# Patient Record
Sex: Male | Born: 1950 | Race: Black or African American | Hispanic: No | Marital: Married | Smoking: Never smoker
Health system: Southern US, Community
[De-identification: ages and names within clinical notes are randomized; demographics above are authoritative.]

## PROBLEM LIST (undated history)

## (undated) DIAGNOSIS — T884XXA Failed or difficult intubation, initial encounter: Secondary | ICD-10-CM

## (undated) DIAGNOSIS — E119 Type 2 diabetes mellitus without complications: Secondary | ICD-10-CM

## (undated) DIAGNOSIS — H4089 Other specified glaucoma: Secondary | ICD-10-CM

## (undated) DIAGNOSIS — E785 Hyperlipidemia, unspecified: Secondary | ICD-10-CM

## (undated) DIAGNOSIS — H04123 Dry eye syndrome of bilateral lacrimal glands: Secondary | ICD-10-CM

## (undated) DIAGNOSIS — I10 Essential (primary) hypertension: Secondary | ICD-10-CM

## (undated) DIAGNOSIS — G709 Myoneural disorder, unspecified: Secondary | ICD-10-CM

## (undated) DIAGNOSIS — N400 Enlarged prostate without lower urinary tract symptoms: Secondary | ICD-10-CM

## (undated) HISTORY — DX: Myoneural disorder, unspecified: G70.9

## (undated) HISTORY — PX: POLYPECTOMY: SHX149

## (undated) HISTORY — DX: Essential (primary) hypertension: I10

## (undated) HISTORY — DX: Dry eye syndrome of bilateral lacrimal glands: H04.123

## (undated) HISTORY — DX: Type 2 diabetes mellitus without complications: E11.9

## (undated) HISTORY — DX: Hyperlipidemia, unspecified: E78.5

## (undated) HISTORY — DX: Benign prostatic hyperplasia without lower urinary tract symptoms: N40.0

## (undated) HISTORY — PX: COLONOSCOPY: SHX174

---
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
## 1995-10-31 HISTORY — PX: LEG SURGERY: SHX1003

## 2008-10-30 DIAGNOSIS — N400 Enlarged prostate without lower urinary tract symptoms: Secondary | ICD-10-CM

## 2008-10-30 HISTORY — DX: Benign prostatic hyperplasia without lower urinary tract symptoms: N40.0

## 2013-04-09 ENCOUNTER — Ambulatory Visit (INDEPENDENT_AMBULATORY_CARE_PROVIDER_SITE_OTHER): Payer: BC Managed Care – PPO | Admitting: Family Medicine

## 2013-04-09 VITALS — BP 128/80 | HR 86 | Temp 98.6°F | Resp 18 | Ht 66.5 in | Wt 149.2 lb

## 2013-04-09 DIAGNOSIS — E119 Type 2 diabetes mellitus without complications: Secondary | ICD-10-CM

## 2013-04-09 DIAGNOSIS — N400 Enlarged prostate without lower urinary tract symptoms: Secondary | ICD-10-CM

## 2013-04-09 DIAGNOSIS — I10 Essential (primary) hypertension: Secondary | ICD-10-CM | POA: Insufficient documentation

## 2013-04-09 DIAGNOSIS — E1140 Type 2 diabetes mellitus with diabetic neuropathy, unspecified: Secondary | ICD-10-CM | POA: Insufficient documentation

## 2013-04-09 DIAGNOSIS — I152 Hypertension secondary to endocrine disorders: Secondary | ICD-10-CM | POA: Insufficient documentation

## 2013-04-09 DIAGNOSIS — N138 Other obstructive and reflux uropathy: Secondary | ICD-10-CM

## 2013-04-09 LAB — COMPREHENSIVE METABOLIC PANEL
ALT: 18 U/L (ref 0–53)
AST: 18 U/L (ref 0–37)
Calcium: 9.4 mg/dL (ref 8.4–10.5)
Chloride: 107 mEq/L (ref 96–112)
Creat: 0.88 mg/dL (ref 0.50–1.35)
Total Bilirubin: 0.6 mg/dL (ref 0.3–1.2)

## 2013-04-09 LAB — POCT GLYCOSYLATED HEMOGLOBIN (HGB A1C): Hemoglobin A1C: 5.5

## 2013-04-09 MED ORDER — QUINAPRIL HCL 20 MG PO TABS: 20.0000 mg | ORAL_TABLET | Freq: Every day | ORAL | Status: DC

## 2013-04-09 MED ORDER — GLIPIZIDE 5 MG PO TABS: 5.0000 mg | ORAL_TABLET | Freq: Every day | ORAL | Status: DC

## 2013-04-09 MED ORDER — ATENOLOL 100 MG PO TABS: 100.0000 mg | ORAL_TABLET | Freq: Every day | ORAL | Status: DC

## 2013-04-09 MED ORDER — TAMSULOSIN HCL 0.4 MG PO CAPS: 0.4000 mg | ORAL_CAPSULE | Freq: Every day | ORAL | Status: DC

## 2013-04-09 MED ORDER — METFORMIN HCL 500 MG PO TABS: 500.0000 mg | ORAL_TABLET | Freq: Two times a day (BID) | ORAL | Status: DC

## 2013-04-09 MED ORDER — AMLODIPINE BESYLATE 10 MG PO TABS: 10.0000 mg | ORAL_TABLET | Freq: Every day | ORAL | Status: DC

## 2013-04-09 NOTE — Progress Notes (Signed)
[facility] [address] [place] [address] [phone]  Date:  04/09/2013   Name:  Preston Pennington   DOB:  Oct 02, 1951   MRN:  [id number]  PCP:  No primary provider on file.    Chief Complaint: Medication Refill   History of Present Illness:  Preston Pennington is a 62 y.o. very pleasant male patient who presents with the following:  He is here today to get a medication refill.  He is seeking a new PCP.  He does have a urologist in [facility] as well He has no other complaints today, has not had labs yet this year.   Compliant with his medications  There are no active problems to display for this patient.   Past Medical History  Diagnosis Date  . Hypertension   . Diabetes mellitus without complication   . Prostate enlargement 2010    See's Urologist     Past Surgical History  Procedure Laterality Date  . Leg surgery Right 1997    History  Substance Use Topics  . Smoking status: Never Smoker   . Smokeless tobacco: Not on file  . Alcohol Use: No    History reviewed. No pertinent family history.  No Known Allergies  Medication list has been reviewed and updated.  No current outpatient prescriptions on file prior to visit.   No current facility-administered medications on file prior to visit.    Review of Systems:  As per HPI- otherwise negative.   Physical Examination: Filed Vitals:   04/09/13 1210  BP: 128/80  Pulse: 86  Temp: 98.6 F (37 C)  Resp: 18   Filed Vitals:   04/09/13 1210  Height: 5' 6.5" (1.689 m)  Weight: 149 lb 3.2 oz (67.677 kg)   Body mass index is 23.72 kg/(m^2). Ideal Body Weight: Weight in (lb) to have BMI = 25: 156.9  GEN: WDWN, NAD, Non-toxic, A & O x 3, looks well HEENT: Atraumatic, Normocephalic. Neck supple. No masses, No LAD. Ears and Nose: No external deformity. CV: RRR, No M/G/R. No JVD. No thrill. No extra heart sounds. PULM: CTA B, no wheezes, crackles, rhonchi. No retractions. No resp.  distress. No accessory muscle use. ABD: S, NT, ND. No rebound. No HSM. EXTR: No c/c/e NEURO Normal gait.  PSYCH: Normally interactive. Conversant. Not depressed or anxious appearing.  Calm demeanor.    Results for orders placed in visit on 04/09/13  POCT GLYCOSYLATED HEMOGLOBIN (HGB A1C)      Result Value Range   Hemoglobin A1C 5.5      Assessment and Plan: Type II or unspecified type diabetes mellitus without mention of complication, not stated as uncontrolled - Plan: POCT glycosylated hemoglobin (Hb A1C), glipiZIDE (GLUCOTROL) 5 MG tablet, metFORMIN (GLUCOPHAGE) 500 MG tablet, glipiZIDE (GLUCOTROL) 5 MG tablet  BPH (benign prostatic hyperplasia) - Plan: tamsulosin (FLOMAX) 0.4 MG CAPS  HTN (hypertension) - Plan: amLODipine (NORVASC) 10 MG tablet, atenolol (TENORMIN) 100 MG tablet, quinapril (ACCUPRIL) 20 MG tablet, Comprehensive metabolic panel, atenolol (TENORMIN) 100 MG tablet, quinapril (ACCUPRIL) 20 MG tablet  Unspecified essential hypertension  DM controlled, BP controlled.  Sent all his rx to his mail order pharmacy, and also did 30 day supply to a local pharmacy for those he is nearly out of.   Will plan further follow- up pending labs. Plan recheck in about 3 months.    Signed Abbe Amsterdam, MD

## 2013-04-09 NOTE — Patient Instructions (Addendum)
Your diabetes control is good, and your blood pressure looks fine.  I will be in touch with the rest of your labs.  Your medicines have been sent to your [facility] pharmacy.  Take care!

## 2013-04-10 ENCOUNTER — Encounter: Payer: Self-pay | Admitting: Family Medicine

## 2013-09-23 ENCOUNTER — Other Ambulatory Visit: Payer: Self-pay | Admitting: Internal Medicine

## 2013-09-23 ENCOUNTER — Ambulatory Visit
Admission: RE | Admit: 2013-09-23 | Discharge: 2013-09-23 | Disposition: A | Discharge status: Home / Self Care | Payer: Worker's Compensation | Source: Ambulatory Visit | Attending: Internal Medicine | Admitting: Internal Medicine

## 2013-09-23 DIAGNOSIS — S1093XA Contusion of unspecified part of neck, initial encounter: Secondary | ICD-10-CM

## 2013-09-23 DIAGNOSIS — S0003XA Contusion of scalp, initial encounter: Secondary | ICD-10-CM

## 2013-12-03 ENCOUNTER — Ambulatory Visit (INDEPENDENT_AMBULATORY_CARE_PROVIDER_SITE_OTHER): Payer: 59 | Admitting: Family

## 2013-12-03 ENCOUNTER — Encounter: Payer: Self-pay | Admitting: Family

## 2013-12-03 VITALS — BP 110/64 | HR 63 | Ht 66.5 in | Wt 159.0 lb

## 2013-12-03 DIAGNOSIS — E119 Type 2 diabetes mellitus without complications: Secondary | ICD-10-CM

## 2013-12-03 DIAGNOSIS — Z7189 Other specified counseling: Secondary | ICD-10-CM

## 2013-12-03 DIAGNOSIS — Z7689 Persons encountering health services in other specified circumstances: Secondary | ICD-10-CM

## 2013-12-03 DIAGNOSIS — I10 Essential (primary) hypertension: Secondary | ICD-10-CM

## 2013-12-03 DIAGNOSIS — N400 Enlarged prostate without lower urinary tract symptoms: Secondary | ICD-10-CM

## 2013-12-03 LAB — COMPREHENSIVE METABOLIC PANEL
ALT: 17 U/L (ref 0–53)
AST: 19 U/L (ref 0–37)
Albumin: 3.9 g/dL (ref 3.5–5.2)
Alkaline Phosphatase: 63 U/L (ref 39–117)
BILIRUBIN TOTAL: 0.9 mg/dL (ref 0.3–1.2)
BUN: 14 mg/dL (ref 6–23)
CHLORIDE: 105 meq/L (ref 96–112)
CO2: 27 mEq/L (ref 19–32)
Calcium: 9.2 mg/dL (ref 8.4–10.5)
Creatinine, Ser: 0.8 mg/dL (ref 0.4–1.5)
GFR: 118.56 mL/min (ref >60.00)
Glucose, Bld: 89 mg/dL (ref 70–99)
Potassium: 4 mEq/L (ref 3.5–5.1)
Sodium: 140 mEq/L (ref 135–145)
Total Protein: 7 g/dL (ref 6.0–8.3)

## 2013-12-03 LAB — CBC WITH DIFFERENTIAL/PLATELET
BASOS PCT: 0.4 % (ref 0.0–3.0)
Basophils Absolute: 0 10*3/uL (ref 0.0–0.1)
EOS ABS: 0.2 10*3/uL (ref 0.0–0.7)
Eosinophils Relative: 4.7 % (ref 0.0–5.0)
HEMATOCRIT: 44.8 % (ref 39.0–52.0)
HEMOGLOBIN: 14.1 g/dL (ref 13.0–17.0)
LYMPHS ABS: 1.8 10*3/uL (ref 0.7–4.0)
LYMPHS PCT: 41 % (ref 12.0–46.0)
MCHC: 31.5 g/dL (ref 30.0–36.0)
MCV: 82.6 fl (ref 78.0–100.0)
Monocytes Absolute: 0.4 10*3/uL (ref 0.1–1.0)
Monocytes Relative: 8.2 % (ref 3.0–12.0)
NEUTROS ABS: 2 10*3/uL (ref 1.4–7.7)
Neutrophils Relative %: 45.7 % (ref 43.0–77.0)
Platelets: 133 10*3/uL — ABNORMAL LOW (ref 150.0–400.0)
RBC: 5.43 Mil/uL (ref 4.22–5.81)
RDW: 13.1 % (ref 11.5–14.6)
WBC: 4.3 10*3/uL — ABNORMAL LOW (ref 4.5–10.5)

## 2013-12-03 LAB — POCT URINALYSIS DIPSTICK
BILIRUBIN UA: NEGATIVE
Blood, UA: NEGATIVE
Glucose, UA: NEGATIVE
Ketones, UA: NEGATIVE
LEUKOCYTES UA: NEGATIVE
NITRITE UA: NEGATIVE
PROTEIN UA: NEGATIVE
Spec Grav, UA: 1.015
Urobilinogen, UA: 0.2
pH, UA: 6

## 2013-12-03 LAB — LIPID PANEL
CHOL/HDL RATIO: 4
Cholesterol: 164 mg/dL (ref 0–200)
HDL: 45.2 mg/dL (ref >39.00)
LDL Cholesterol: 109 mg/dL — ABNORMAL HIGH (ref 0–99)
Triglycerides: 47 mg/dL (ref 0.0–149.0)
VLDL: 9.4 mg/dL (ref 0.0–40.0)

## 2013-12-03 LAB — MICROALBUMIN / CREATININE URINE RATIO
Creatinine,U: 75.9 mg/dL
Microalb Creat Ratio: 2.1 mg/g (ref 0.0–30.0)
Microalb, Ur: 1.6 mg/dL (ref 0.0–1.9)

## 2013-12-03 LAB — HEMOGLOBIN A1C: Hgb A1c MFr Bld: 5.2 % (ref 4.6–6.5)

## 2013-12-03 MED ORDER — QUINAPRIL HCL 20 MG PO TABS: 20.0000 mg | ORAL_TABLET | Freq: Every day | ORAL | Status: DC

## 2013-12-03 MED ORDER — METFORMIN HCL 500 MG PO TABS: 500.0000 mg | ORAL_TABLET | Freq: Two times a day (BID) | ORAL | Status: DC

## 2013-12-03 MED ORDER — TAMSULOSIN HCL 0.4 MG PO CAPS: 0.4000 mg | ORAL_CAPSULE | Freq: Every day | ORAL | Status: DC

## 2013-12-03 MED ORDER — ATENOLOL 100 MG PO TABS: 100.0000 mg | ORAL_TABLET | Freq: Every day | ORAL | Status: DC

## 2013-12-03 MED ORDER — GLIPIZIDE 5 MG PO TABS: 5.0000 mg | ORAL_TABLET | Freq: Every day | ORAL | Status: DC

## 2013-12-03 NOTE — Progress Notes (Signed)
   Subjective:    Patient ID: Preston Pennington, male    DOB: 11-Jun-1951, 63 y.o.   MRN: [id number]  HPI  63 y.o. Black male presents today to establish care. Pt has a PMH of type 2 diabetes, HTN, BPH. Pt has no current complaints. Pt is requesting a chest xray for work as his method of TB testing.     Review of Systems  Constitutional: Negative.   HENT: Negative.   Eyes: Negative.   Respiratory: Negative.   Cardiovascular: Negative.   Gastrointestinal: Negative.   Endocrine: Negative.   Genitourinary: Negative.   Musculoskeletal: Negative.   Skin: Negative.   Allergic/Immunologic: Negative.   Neurological: Negative.   Hematological: Negative.   Psychiatric/Behavioral: Negative.    Past Medical History  Diagnosis Date  . Hypertension   . Diabetes mellitus without complication   . Prostate enlargement 2010    See's Urologist     History   Social History  . Marital Status: Married    Spouse Name: N/A    Number of Children: N/A  . Years of Education: N/A   Occupational History  . Not on file.   Social History Main Topics  . Smoking status: Never Smoker   . Smokeless tobacco: Not on file  . Alcohol Use: No  . Drug Use: No  . Sexual Activity: Not on file   Other Topics Concern  . Not on file   Social History Narrative  . No narrative on file    Past Surgical History  Procedure Laterality Date  . Leg surgery Right 1997    History reviewed. No pertinent family history.  No Known Allergies  Current Outpatient Prescriptions on File Prior to Visit  Medication Sig Dispense Refill  . amLODipine (NORVASC) 10 MG tablet Take 1 tablet (10 mg total) by mouth daily.  90 tablet  3   No current facility-administered medications on file prior to visit.    BP 110/64  Pulse 63  Ht 5' 6.5" (1.689 m)  Wt 159 lb (72.122 kg)  BMI 25.28 kg/m2chart    Objective:   Physical Exam  Constitutional: He is oriented to person, place, and time. Vital signs are normal. He appears  well-developed and well-nourished.  HENT:  Head: Normocephalic.  Nose: Nose normal.  Cardiovascular: Normal rate, regular rhythm, S1 normal, S2 normal, normal heart sounds and normal pulses.   Pulmonary/Chest: Effort normal and breath sounds normal.  Abdominal: Soft. Normal appearance and bowel sounds are normal.  Neurological: He is alert and oriented to person, place, and time.  Skin: Skin is warm, dry and intact.  Psychiatric: He has a normal mood and affect. His speech is normal and behavior is normal.          Assessment & Plan:  63 y.o. Black male presents today to establish care.  Type 2 diabetes: Continue Glipizide 5mg  p.o.   - Continue Metformin 500mg  BID p.o.  Hypertension: Continue Amlodipine 10mg  p.o.   - Continue Atenolol 100mg  p.o.   - Continue Quinapril 20mg  p.o.  BPH- Continue Tamsulosin 0.4mg  p.o.   Labs: CBC, CMP, Lipid panel, UA, Micro-albumin, hemoglobin A1C - Chest xray as requested by patients employer to r/o TB. Pt has no symptoms, this is an annual screening.

## 2013-12-03 NOTE — Patient Instructions (Signed)
Sodium-Controlled Diet Sodium is a mineral. It is found in many foods. Sodium may be found naturally or added during the making of a food. The most common form of sodium is salt, which is made up of sodium and chloride. Reducing your sodium intake involves changing your eating habits. The following guidelines will help you reduce the sodium in your diet:  Stop using the salt shaker.  Use salt sparingly in cooking and baking.  Substitute with sodium-free seasonings and spices.  Do not use a salt substitute (potassium chloride) without your caregiver's permission.  Include a variety of fresh, unprocessed foods in your diet.  Limit the use of processed and convenience foods that are high in sodium. USE THE FOLLOWING FOODS SPARINGLY: Breads/Starches  Commercial bread stuffing, commercial pancake or waffle mixes, coating mixes. Waffles. Croutons. Prepared (boxed or frozen) potato, rice, or noodle mixes that contain salt or sodium. Salted [place] fries or hash browns. Salted popcorn, breads, crackers, chips, or snack foods. Vegetables  Vegetables canned with salt or prepared in cream, butter, or cheese sauces. Sauerkraut. Tomato or vegetable juices canned with salt.  Fresh vegetables are allowed if rinsed thoroughly. Fruit  Fruit is okay to eat. Meat and Meat Substitutes  Salted or smoked meats, such as bacon or Canadian bacon, chipped or corned beef, hot dogs, salt pork, luncheon meats, pastrami, ham, or sausage. Canned or smoked fish, poultry, or meat. Processed cheese or cheese spreads, blue or Roquefort cheese. Battered or frozen fish products. Prepared spaghetti sauce. Baked beans. Reuben sandwiches. Salted nuts. Caviar. Milk  Limit buttermilk to 1 cup per week. Soups and Combination Foods  Bouillon cubes, canned or dried soups, broth, consomm. Convenience (frozen or packaged) dinners with more than 600 mg sodium. Pot pies, pizza, Asian food, fast food cheeseburgers, and specialty  sandwiches. Desserts and Sweets  Regular (salted) desserts, pie, commercial fruit snack pies, commercial snack cakes, canned puddings.  Eat desserts and sweets in moderation. Fats and Oils  Gravy mixes or canned gravy. No more than 1 to 2 tbs of salad dressing. Chip dips.  Eat fats and oils in moderation. Beverages  See those listed under the vegetables and milk groups. Condiments  Ketchup, mustard, meat sauces, salsa, regular (salted) and lite soy sauce or mustard. Dill pickles, olives, meat tenderizer. Prepared horseradish or pickle relish. Dutch-processed cocoa. Baking powder or baking soda used medicinally. Worcestershire sauce. "Light" salt. Salt substitute, unless approved by your caregiver. Document Released: 04/07/2002 Document Revised: 01/08/2012 Document Reviewed: 11/08/2009 [facility] Patient Information 2014 [place], [place]. Exercise to Stay Healthy Exercise helps you become and stay healthy. EXERCISE IDEAS AND TIPS Choose exercises that:  You enjoy.  Fit into your day. You do not need to exercise really hard to be healthy. You can do exercises at a slow or medium level and stay healthy. You can:  Stretch before and after working out.  Try yoga, Pilates, or tai chi.  Lift weights.  Walk fast, swim, jog, run, climb stairs, bicycle, dance, or rollerskate.  Take aerobic classes. Exercises that burn about 150 calories:  Running 1  miles in 15 minutes.  Playing volleyball for 45 to 60 minutes.  Washing and waxing a car for 45 to 60 minutes.  Playing touch football for 45 minutes.  Walking 1  miles in 35 minutes.  Pushing a stroller 1  miles in 30 minutes.  Playing basketball for 30 minutes.  Raking leaves for 30 minutes.  Bicycling 5 miles in 30 minutes.  Walking 2 miles in  30 minutes.  Dancing for 30 minutes.  Shoveling snow for 15 minutes.  Swimming laps for 20 minutes.  Walking up stairs for 15 minutes.  Bicycling 4 miles in 15  minutes.  Gardening for 30 to 45 minutes.  Jumping rope for 15 minutes.  Washing windows or floors for 45 to 60 minutes. Document Released: 11/18/2010 Document Revised: 01/08/2012 Document Reviewed: 11/18/2010 [facility] Patient Information 2014 [place], [place].

## 2013-12-05 ENCOUNTER — Telehealth: Payer: Self-pay

## 2013-12-05 ENCOUNTER — Ambulatory Visit (INDEPENDENT_AMBULATORY_CARE_PROVIDER_SITE_OTHER)
Admission: RE | Admit: 2013-12-05 | Discharge: 2013-12-05 | Disposition: A | Discharge status: Home / Self Care | Payer: 59 | Source: Ambulatory Visit | Attending: Family | Admitting: Family

## 2013-12-05 DIAGNOSIS — Z7189 Other specified counseling: Secondary | ICD-10-CM

## 2013-12-05 DIAGNOSIS — Z7689 Persons encountering health services in other specified circumstances: Secondary | ICD-10-CM

## 2013-12-05 NOTE — Telephone Encounter (Signed)
Relevant patient education assigned to patient using Emmi. ° °

## 2013-12-08 ENCOUNTER — Telehealth: Payer: Self-pay | Admitting: Family

## 2013-12-08 NOTE — Telephone Encounter (Signed)
Pt is requesting results from labs done on 12/03/13

## 2013-12-08 NOTE — Telephone Encounter (Signed)
Pt aware chest x-ray was normal and copy mailed

## 2013-12-22 ENCOUNTER — Telehealth: Payer: Self-pay | Admitting: Family

## 2013-12-22 DIAGNOSIS — E119 Type 2 diabetes mellitus without complications: Secondary | ICD-10-CM

## 2013-12-22 DIAGNOSIS — N400 Enlarged prostate without lower urinary tract symptoms: Secondary | ICD-10-CM

## 2013-12-22 DIAGNOSIS — I10 Essential (primary) hypertension: Secondary | ICD-10-CM

## 2013-12-22 MED ORDER — ATENOLOL 100 MG PO TABS: 100.0000 mg | ORAL_TABLET | Freq: Every day | ORAL | Status: DC

## 2013-12-22 MED ORDER — GLIPIZIDE 5 MG PO TABS: 5.0000 mg | ORAL_TABLET | Freq: Every day | ORAL | Status: DC

## 2013-12-22 MED ORDER — TAMSULOSIN HCL 0.4 MG PO CAPS: 0.4000 mg | ORAL_CAPSULE | Freq: Every day | ORAL | Status: DC

## 2013-12-22 MED ORDER — QUINAPRIL HCL 20 MG PO TABS
20.0000 mg | ORAL_TABLET | Freq: Every day | ORAL | Status: DC
Start: 2013-12-22 — End: 2014-01-30

## 2013-12-22 MED ORDER — AMLODIPINE BESYLATE 10 MG PO TABS: 10.0000 mg | ORAL_TABLET | Freq: Every day | ORAL | Status: DC

## 2013-12-22 MED ORDER — METFORMIN HCL 500 MG PO TABS: 500.0000 mg | ORAL_TABLET | Freq: Two times a day (BID) | ORAL | Status: DC

## 2013-12-22 NOTE — Telephone Encounter (Signed)
Rxs were sent to [facility] on 12/03/13 for 90 day supplies. 30 day supply sent to pharmacy

## 2013-12-22 NOTE — Telephone Encounter (Signed)
Pt needs refill on amlodipine,flomax,quinapril,metformin,tenorim,glipizide #30 each w/refills sent to [facility] [address]

## 2014-01-30 ENCOUNTER — Encounter (HOSPITAL_COMMUNITY): Payer: Self-pay | Admitting: Emergency Medicine

## 2014-01-30 ENCOUNTER — Emergency Department (HOSPITAL_COMMUNITY)
Admission: EM | Admit: 2014-01-30 | Discharge: 2014-01-30 | Disposition: A | Discharge status: Home / Self Care | Payer: 59 | Attending: Emergency Medicine | Admitting: Emergency Medicine

## 2014-01-30 ENCOUNTER — Emergency Department (HOSPITAL_COMMUNITY): Payer: 59

## 2014-01-30 DIAGNOSIS — Z7982 Long term (current) use of aspirin: Secondary | ICD-10-CM | POA: Insufficient documentation

## 2014-01-30 DIAGNOSIS — E119 Type 2 diabetes mellitus without complications: Secondary | ICD-10-CM | POA: Insufficient documentation

## 2014-01-30 DIAGNOSIS — R209 Unspecified disturbances of skin sensation: Secondary | ICD-10-CM | POA: Insufficient documentation

## 2014-01-30 DIAGNOSIS — S93409A Sprain of unspecified ligament of unspecified ankle, initial encounter: Secondary | ICD-10-CM | POA: Insufficient documentation

## 2014-01-30 DIAGNOSIS — I10 Essential (primary) hypertension: Secondary | ICD-10-CM | POA: Insufficient documentation

## 2014-01-30 DIAGNOSIS — Z79899 Other long term (current) drug therapy: Secondary | ICD-10-CM | POA: Insufficient documentation

## 2014-01-30 DIAGNOSIS — R202 Paresthesia of skin: Secondary | ICD-10-CM

## 2014-01-30 DIAGNOSIS — X500XXA Overexertion from strenuous movement or load, initial encounter: Secondary | ICD-10-CM | POA: Insufficient documentation

## 2014-01-30 DIAGNOSIS — Z9889 Other specified postprocedural states: Secondary | ICD-10-CM | POA: Insufficient documentation

## 2014-01-30 DIAGNOSIS — Y9301 Activity, walking, marching and hiking: Secondary | ICD-10-CM | POA: Insufficient documentation

## 2014-01-30 DIAGNOSIS — N400 Enlarged prostate without lower urinary tract symptoms: Secondary | ICD-10-CM | POA: Insufficient documentation

## 2014-01-30 DIAGNOSIS — Y929 Unspecified place or not applicable: Secondary | ICD-10-CM | POA: Insufficient documentation

## 2014-01-30 DIAGNOSIS — S93401A Sprain of unspecified ligament of right ankle, initial encounter: Secondary | ICD-10-CM

## 2014-01-30 LAB — CBC WITH DIFFERENTIAL/PLATELET
BASOS ABS: 0 10*3/uL (ref 0.0–0.1)
Basophils Relative: 0 % (ref 0–1)
Eosinophils Absolute: 0.2 10*3/uL (ref 0.0–0.7)
Eosinophils Relative: 4 % (ref 0–5)
HEMATOCRIT: 41.6 % (ref 39.0–52.0)
Hemoglobin: 14 g/dL (ref 13.0–17.0)
LYMPHS PCT: 30 % (ref 12–46)
Lymphs Abs: 1.2 10*3/uL (ref 0.7–4.0)
MCH: 26.7 pg (ref 26.0–34.0)
MCHC: 33.7 g/dL (ref 30.0–36.0)
MCV: 79.4 fL (ref 78.0–100.0)
MONO ABS: 0.2 10*3/uL (ref 0.1–1.0)
MONOS PCT: 6 % (ref 3–12)
NEUTROS ABS: 2.5 10*3/uL (ref 1.7–7.7)
Neutrophils Relative %: 60 % (ref 43–77)
Platelets: 122 10*3/uL — ABNORMAL LOW (ref 150–400)
RBC: 5.24 MIL/uL (ref 4.22–5.81)
RDW: 12.9 % (ref 11.5–15.5)
WBC: 4.1 10*3/uL (ref 4.0–10.5)

## 2014-01-30 LAB — COMPREHENSIVE METABOLIC PANEL
ALT: 13 U/L (ref 0–53)
AST: 15 U/L (ref 0–37)
Albumin: 3.7 g/dL (ref 3.5–5.2)
Alkaline Phosphatase: 71 U/L (ref 39–117)
BILIRUBIN TOTAL: 0.5 mg/dL (ref 0.3–1.2)
BUN: 15 mg/dL (ref 6–23)
CHLORIDE: 104 meq/L (ref 96–112)
CO2: 25 meq/L (ref 19–32)
Calcium: 9.3 mg/dL (ref 8.4–10.5)
Creatinine, Ser: 0.85 mg/dL (ref 0.50–1.35)
GFR calc Af Amer: >90 mL/min (ref >90)
Glucose, Bld: 218 mg/dL — ABNORMAL HIGH (ref 70–99)
Potassium: 4.8 mEq/L (ref 3.7–5.3)
Sodium: 141 mEq/L (ref 137–147)
Total Protein: 6.9 g/dL (ref 6.0–8.3)

## 2014-01-30 LAB — CBG MONITORING, ED: Glucose-Capillary: 226 mg/dL — ABNORMAL HIGH (ref 70–99)

## 2014-01-30 MED ORDER — TRAMADOL HCL 50 MG PO TABS: 50.0000 mg | ORAL_TABLET | Freq: Four times a day (QID) | ORAL | Status: DC | PRN

## 2014-01-30 NOTE — ED Notes (Signed)
cbg 226

## 2014-01-30 NOTE — ED Notes (Addendum)
Pt presents with ankle pain to his left ankle X 2 weeks. Pt states that he was walking up stairs when his "anke slippled"  Pt denies any falls or additional injuries  Pt has had surgery on that extremity. Pt also "numbnes" to left fingers" X since January 2015. Pt states that he has seen [place] for the numbness  "but she did not see anything" and numbness to his leg leg X 4 days. Pt denies any recent illness, pain with urination. Pt is ambulatory at time of triage
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
## 2014-01-30 NOTE — ED Notes (Signed)
Discharge instructions reviewed with pt and daughter. Pt verbalized understanding.

## 2014-01-30 NOTE — ED Notes (Signed)
Patient transported to X-ray 

## 2014-01-30 NOTE — Discharge Instructions (Signed)
Continue naprosyn for pain and inflammation. Ultram for severe pain. Keep ankle elevated when at home. Ice several times a day. Follow up with orthopedics specialist regarding your ankle. Follow up with primary care doctor regarding your numbness. Your MR of the brain is negative for a stroke.   Paresthesia Paresthesia is an abnormal burning or prickling sensation. This sensation is generally felt in the hands, arms, legs, or feet. However, it may occur in any part of the body. It is usually not painful. The feeling may be described as:  Tingling or numbness.  "Pins and needles."  Skin crawling.  Buzzing.  Limbs "falling asleep."  Itching. Most people experience temporary (transient) paresthesia at some time in their lives. CAUSES  Paresthesia may occur when you breathe too quickly (hyperventilation). It can also occur without any apparent cause. Commonly, paresthesia occurs when pressure is placed on a nerve. The feeling quickly goes away once the pressure is removed. For some people, however, paresthesia is a long-lasting (chronic) condition caused by an underlying disorder. The underlying disorder may be:  A traumatic, direct injury to nerves. Examples include a:  Broken (fractured) neck.  Fractured skull.  A disorder affecting the brain and spinal cord (central nervous system). Examples include:  Transverse myelitis.  Encephalitis.  Transient ischemic attack.  Multiple sclerosis.  Stroke.  Tumor or blood vessel problems, such as an arteriovenous malformation pressing against the brain or spinal cord.  A condition that damages the peripheral nerves (peripheral neuropathy). Peripheral nerves are not part of the brain and spinal cord. These conditions include:  Diabetes.  Peripheral vascular disease.  Nerve entrapment syndromes, such as carpal tunnel syndrome.  Shingles.  Hypothyroidism.  Vitamin B12 deficiencies.  Alcoholism.  Heavy metal poisoning (lead,  arsenic).  Rheumatoid arthritis.  Systemic lupus erythematosus. DIAGNOSIS  Your caregiver will attempt to find the underlying cause of your paresthesia. Your caregiver may:  Take your medical history.  Perform a physical exam.  Order various lab tests.  Order imaging tests. TREATMENT  Treatment for paresthesia depends on the underlying cause. HOME CARE INSTRUCTIONS  Avoid drinking alcohol.  You may consider massage or acupuncture to help relieve your symptoms.  Keep all follow-up appointments as directed by your caregiver. SEEK IMMEDIATE MEDICAL CARE IF:   You feel weak.  You have trouble walking or moving.  You have problems with speech or vision.  You feel confused.  You cannot control your bladder or bowel movements.  You feel numbness after an injury.  You faint.  Your burning or prickling feeling gets worse when walking.  You have pain, cramps, or dizziness.  You develop a rash. MAKE SURE YOU:  Understand these instructions.  Will watch your condition.  Will get help right away if you are not doing well or get worse. Document Released: 10/06/2002 Document Revised: 01/08/2012 Document Reviewed: 07/07/2011 [facility] Patient Information 2014 [place].

## 2014-01-30 NOTE — ED Notes (Signed)
Pt ambulated to restroom. 

## 2014-01-31 NOTE — ED Provider Notes (Signed)
Medical screening examination/treatment/procedure(s) were performed by non-physician practitioner and as supervising physician I was immediately available for consultation/collaboration.   EKG Interpretation None        Blanchard Kelch, MD 01/31/14 1244

## 2014-02-02 ENCOUNTER — Telehealth: Payer: Self-pay | Admitting: Family

## 2014-02-02 NOTE — Telephone Encounter (Signed)
Patient went to [facility] on Friday, 01/30/14 for R ankle sprain and parasthesia of L arm and leg and was advised to schedule an appointment with his PCP Roxy Cedar) as soon as possible. [facility] doesn't have any 30 slots available for hospital followups and the patient can only come in the morning. I need your permission to schedule him to see you and use to same day appointment to allot for the time that the appointment needs. I told the patient that I had to send a note back and that we will call him to schedule the appointment after you approve it. His contact number has been verified. Thanks!!!

## 2014-02-02 NOTE — Telephone Encounter (Signed)
Pt moved to [facility] schedule.

## 2014-02-02 NOTE — Telephone Encounter (Signed)
Abby Potash has agreed to see him tomorrow taking two 15 minute slots - he should see PCP.

## 2014-02-03 ENCOUNTER — Ambulatory Visit (INDEPENDENT_AMBULATORY_CARE_PROVIDER_SITE_OTHER): Payer: 59 | Admitting: Family

## 2014-02-03 ENCOUNTER — Encounter: Payer: Self-pay | Admitting: Family

## 2014-02-03 VITALS — BP 124/72 | Temp 98.7°F | Wt 155.0 lb

## 2014-02-03 DIAGNOSIS — S93401A Sprain of unspecified ligament of right ankle, initial encounter: Secondary | ICD-10-CM

## 2014-02-03 DIAGNOSIS — E119 Type 2 diabetes mellitus without complications: Secondary | ICD-10-CM

## 2014-02-03 DIAGNOSIS — S93409A Sprain of unspecified ligament of unspecified ankle, initial encounter: Secondary | ICD-10-CM

## 2014-02-03 MED ORDER — GLUCOSE BLOOD VI STRP: ORAL_STRIP | Status: DC

## 2014-02-03 MED ORDER — MELOXICAM 7.5 MG PO TABS: 7.5000 mg | ORAL_TABLET | Freq: Every day | ORAL | Status: DC

## 2014-02-03 NOTE — Progress Notes (Signed)
Pre visit review using our clinic review tool, if applicable. No additional management support is needed unless otherwise documented below in the visit note. 

## 2014-02-03 NOTE — Progress Notes (Signed)
Subjective:    Patient ID: Preston Pennington, male    DOB: 03-Apr-1951, 63 y.o.   MRN: [id number]  HPI  63 year old African male, nonsmoker is in today as a hospital followup from 01/30/2014. He has a history of hypertension and type 2 diabetes that is currently stable. He has a history of a fracture right ankle and rolled his ankle over 2 weeks prior to his visit to the emergency department. He continued to have right ankle pain that has improved. Rates the pain a 4/10, worse with standing for an extended period of time. Had an x-ray performed that showed a chronic distal fibula fracture. He was prescribed tramadol to help the pain. Has also been applying ice.  Review of Systems  Constitutional: Negative.   HENT: Negative.   Respiratory: Negative.   Cardiovascular: Negative.   Gastrointestinal: Negative.   Genitourinary: Negative.   Musculoskeletal: Positive for arthralgias.       Right ankle pain  Skin: Negative.   Neurological: Negative.   Hematological: Negative.   Psychiatric/Behavioral: Negative.    Past Medical History  Diagnosis Date  . Hypertension   . Diabetes mellitus without complication   . Prostate enlargement 2010    See's Urologist     History   Social History  . Marital Status: Married    Spouse Name: N/A    Number of Children: N/A  . Years of Education: N/A   Occupational History  . Not on file.   Social History Main Topics  . Smoking status: Never Smoker   . Smokeless tobacco: Not on file  . Alcohol Use: No  . Drug Use: No  . Sexual Activity: Not on file   Other Topics Concern  . Not on file   Social History Narrative  . No narrative on file    Past Surgical History  Procedure Laterality Date  . Leg surgery Right 1997    History reviewed. No pertinent family history.  No Known Allergies  Current Outpatient Prescriptions on File Prior to Visit  Medication Sig Dispense Refill  . amLODipine (NORVASC) 10 MG tablet Take 10 mg by mouth daily.       Marland Kitchen aspirin EC 81 MG tablet Take 81 mg by mouth daily.      Marland Kitchen atenolol (TENORMIN) 100 MG tablet Take 100 mg by mouth daily.      Marland Kitchen glipiZIDE (GLUCOTROL) 5 MG tablet Take 5 mg by mouth daily before breakfast.      . metFORMIN (GLUCOPHAGE) 500 MG tablet Take 500 mg by mouth 2 (two) times daily with a meal.      . Multiple Vitamins-Minerals (MULTIVITAMIN PO) Take 1 tablet by mouth daily.      . quinapril (ACCUPRIL) 20 MG tablet Take 20 mg by mouth at bedtime.      . tamsulosin (FLOMAX) 0.4 MG CAPS capsule Take 0.4 mg by mouth daily after supper.      . traMADol (ULTRAM) 50 MG tablet Take 1 tablet (50 mg total) by mouth every 6 (six) hours as needed.  20 tablet  0   No current facility-administered medications on file prior to visit.    BP 124/72  Temp(Src) 98.7 F (37.1 C)  Wt 155 lb (70.308 kg)chart    Objective:   Physical Exam  Constitutional: He is oriented to person, place, and time. He appears well-developed and well-nourished.  HENT:  Right Ear: External ear normal.  Left Ear: External ear normal.  Nose: Nose normal.  Mouth/Throat: Oropharynx is  clear and moist.  Neck: Normal range of motion. Neck supple.  Cardiovascular: Normal rate, regular rhythm and normal heart sounds.   Pulmonary/Chest: Effort normal and breath sounds normal.  Abdominal: Soft. Bowel sounds are normal.  Musculoskeletal: He exhibits tenderness. He exhibits no edema.  Right lateral ankle tenderness to palpation. Full range of motion without pain.  Neurological: He is alert and oriented to person, place, and time.  Skin: Skin is warm and dry.  Psychiatric: He has a normal mood and affect.          Assessment & Plan:  Preston Pennington was seen today for no specified reason.  Diagnoses and associated orders for this visit:  Right ankle sprain  Type II or unspecified type diabetes mellitus without mention of complication, not stated as uncontrolled  Other Orders - meloxicam (MOBIC) 7.5 MG tablet; Take 1  tablet (7.5 mg total) by mouth daily. - glucose blood (FREESTYLE LITE) test strip; Use as instructed   Call the office with questions or concerns. Recheck as scheduled and as needed.

## 2014-02-03 NOTE — Patient Instructions (Signed)

## 2014-02-16 ENCOUNTER — Telehealth: Payer: Self-pay

## 2014-02-16 NOTE — Telephone Encounter (Signed)
Relevant patient education assigned to patient using Emmi. ° °

## 2014-03-03 ENCOUNTER — Encounter: Payer: 59 | Admitting: Family

## 2014-04-11 ENCOUNTER — Other Ambulatory Visit: Payer: Self-pay | Admitting: Family

## 2014-04-13 ENCOUNTER — Telehealth: Payer: Self-pay | Admitting: Family

## 2014-04-13 MED ORDER — METFORMIN HCL 500 MG PO TABS: 500.0000 mg | ORAL_TABLET | Freq: Two times a day (BID) | ORAL | Status: DC

## 2014-04-13 MED ORDER — TAMSULOSIN HCL 0.4 MG PO CAPS: 0.4000 mg | ORAL_CAPSULE | Freq: Every day | ORAL | Status: DC

## 2014-04-13 NOTE — Telephone Encounter (Signed)
Done

## 2014-04-13 NOTE — Telephone Encounter (Signed)
Pt called and is requesting rx on tamsulosin and meformin

## 2014-05-08 ENCOUNTER — Telehealth: Payer: Self-pay | Admitting: Family

## 2014-05-08 MED ORDER — GLIPIZIDE 5 MG PO TABS: 5.0000 mg | ORAL_TABLET | Freq: Every day | ORAL | Status: DC

## 2014-05-08 MED ORDER — ATENOLOL 100 MG PO TABS: 100.0000 mg | ORAL_TABLET | Freq: Every day | ORAL | Status: DC

## 2014-05-08 MED ORDER — QUINAPRIL HCL 20 MG PO TABS: 20.0000 mg | ORAL_TABLET | Freq: Every day | ORAL | Status: DC

## 2014-05-08 MED ORDER — AMLODIPINE BESYLATE 10 MG PO TABS: 10.0000 mg | ORAL_TABLET | Freq: Every day | ORAL | Status: DC

## 2014-05-08 NOTE — Telephone Encounter (Signed)
Pt is requesting refill of: amLODipine (NORVASC) 10 MG tablet atenolol (TENORMIN) 100 MG tablet glipiZIDE (GLUCOTROL) 5 MG tablet quinapril (ACCUPRIL) 20 MG tablet  Pt wanted cpe next week, but no appts.  First available in the am cpe is 8/14 and asked if we could send in 30 days to get him through until then.  [email or web address]

## 2014-05-08 NOTE — Telephone Encounter (Signed)
Done

## 2014-06-12 ENCOUNTER — Telehealth: Payer: Self-pay | Admitting: Family

## 2014-06-12 ENCOUNTER — Encounter: Payer: 59 | Admitting: Family

## 2014-06-12 MED ORDER — GLUCOSE BLOOD VI STRP: ORAL_STRIP | Status: DC

## 2014-06-12 NOTE — Telephone Encounter (Signed)
Pt said the following rx glucose blood (FREESTYLE LITE) test strip  Does not fit his machine.  Pt is requesting a rx for ACCU-CHEK test [place]

## 2014-06-12 NOTE — Telephone Encounter (Signed)
Rx done and patient informed.

## 2014-06-15 ENCOUNTER — Encounter: Payer: Self-pay | Admitting: Family

## 2014-06-15 ENCOUNTER — Other Ambulatory Visit: Payer: Self-pay | Admitting: Family

## 2014-06-15 ENCOUNTER — Ambulatory Visit (INDEPENDENT_AMBULATORY_CARE_PROVIDER_SITE_OTHER): Payer: 59 | Admitting: Family

## 2014-06-15 DIAGNOSIS — Z0000 Encounter for general adult medical examination without abnormal findings: Secondary | ICD-10-CM

## 2014-06-15 DIAGNOSIS — R195 Other fecal abnormalities: Secondary | ICD-10-CM

## 2014-06-15 DIAGNOSIS — Z125 Encounter for screening for malignant neoplasm of prostate: Secondary | ICD-10-CM

## 2014-06-15 DIAGNOSIS — R972 Elevated prostate specific antigen [PSA]: Secondary | ICD-10-CM

## 2014-06-15 DIAGNOSIS — E119 Type 2 diabetes mellitus without complications: Secondary | ICD-10-CM

## 2014-06-15 DIAGNOSIS — E7800 Pure hypercholesterolemia, unspecified: Secondary | ICD-10-CM

## 2014-06-15 DIAGNOSIS — I10 Essential (primary) hypertension: Secondary | ICD-10-CM

## 2014-06-15 LAB — POCT URINALYSIS DIPSTICK
Bilirubin, UA: NEGATIVE
Blood, UA: NEGATIVE
Glucose, UA: NEGATIVE
Ketones, UA: NEGATIVE
LEUKOCYTES UA: NEGATIVE
NITRITE UA: NEGATIVE
PH UA: 6
Spec Grav, UA: 1.025
Urobilinogen, UA: NEGATIVE

## 2014-06-15 LAB — HEPATIC FUNCTION PANEL
ALT: 13 U/L (ref 0–53)
AST: 17 U/L (ref 0–37)
Albumin: 3.9 g/dL (ref 3.5–5.2)
Alkaline Phosphatase: 74 U/L (ref 39–117)
BILIRUBIN TOTAL: 0.6 mg/dL (ref 0.2–1.2)
Bilirubin, Direct: 0.1 mg/dL (ref 0.0–0.3)
Total Protein: 6.6 g/dL (ref 6.0–8.3)

## 2014-06-15 LAB — CBC WITH DIFFERENTIAL/PLATELET
BASOS ABS: 0 10*3/uL (ref 0.0–0.1)
Basophils Relative: 0.4 % (ref 0.0–3.0)
Eosinophils Absolute: 0.2 10*3/uL (ref 0.0–0.7)
Eosinophils Relative: 3.6 % (ref 0.0–5.0)
HCT: 41.2 % (ref 39.0–52.0)
Hemoglobin: 13.4 g/dL (ref 13.0–17.0)
LYMPHS PCT: 23.1 % (ref 12.0–46.0)
Lymphs Abs: 1.2 10*3/uL (ref 0.7–4.0)
MCHC: 32.6 g/dL (ref 30.0–36.0)
MCV: 81.1 fl (ref 78.0–100.0)
Monocytes Absolute: 0.3 10*3/uL (ref 0.1–1.0)
Monocytes Relative: 6 % (ref 3.0–12.0)
Neutro Abs: 3.4 10*3/uL (ref 1.4–7.7)
Neutrophils Relative %: 66.9 % (ref 43.0–77.0)
Platelets: 145 10*3/uL — ABNORMAL LOW (ref 150.0–400.0)
RBC: 5.08 Mil/uL (ref 4.22–5.81)
RDW: 13.4 % (ref 11.5–15.5)
WBC: 5.1 10*3/uL (ref 4.0–10.5)

## 2014-06-15 LAB — LIPID PANEL
Cholesterol: 163 mg/dL (ref 0–200)
HDL: 43.9 mg/dL (ref >39.00)
LDL Cholesterol: 104 mg/dL — ABNORMAL HIGH (ref 0–99)
NONHDL: 119.1
TRIGLYCERIDES: 75 mg/dL (ref 0.0–149.0)
Total CHOL/HDL Ratio: 4
VLDL: 15 mg/dL (ref 0.0–40.0)

## 2014-06-15 LAB — BASIC METABOLIC PANEL
BUN: 12 mg/dL (ref 6–23)
CHLORIDE: 105 meq/L (ref 96–112)
CO2: 27 mEq/L (ref 19–32)
CREATININE: 1 mg/dL (ref 0.4–1.5)
Calcium: 9.5 mg/dL (ref 8.4–10.5)
GFR: 101.45 mL/min (ref >60.00)
Glucose, Bld: 128 mg/dL — ABNORMAL HIGH (ref 70–99)
Potassium: 4.2 mEq/L (ref 3.5–5.1)
Sodium: 140 mEq/L (ref 135–145)

## 2014-06-15 LAB — PSA: PSA: 12.9 ng/mL — AB (ref 0.10–4.00)

## 2014-06-15 LAB — HEMOGLOBIN A1C: Hgb A1c MFr Bld: 5.2 % (ref 4.6–6.5)

## 2014-06-15 MED ORDER — QUINAPRIL HCL 20 MG PO TABS: 20.0000 mg | ORAL_TABLET | Freq: Every day | ORAL | Status: DC

## 2014-06-15 MED ORDER — METFORMIN HCL 500 MG PO TABS: 500.0000 mg | ORAL_TABLET | Freq: Two times a day (BID) | ORAL | Status: DC

## 2014-06-15 MED ORDER — ATENOLOL 100 MG PO TABS: 100.0000 mg | ORAL_TABLET | Freq: Every day | ORAL | Status: DC

## 2014-06-15 MED ORDER — TAMSULOSIN HCL 0.4 MG PO CAPS: 0.4000 mg | ORAL_CAPSULE | Freq: Every day | ORAL | Status: DC

## 2014-06-15 MED ORDER — GLIPIZIDE 5 MG PO TABS: 5.0000 mg | ORAL_TABLET | Freq: Every day | ORAL | Status: DC

## 2014-06-15 MED ORDER — GLUCOSE BLOOD VI STRP: ORAL_STRIP | Status: DC

## 2014-06-15 NOTE — Patient Instructions (Signed)
1. Stop Asprin  Fecal Occult Blood Test This is a test done on a stool specimen to screen for gastrointestinal bleeding, which may be an indicator of colon cancer Is is usually done as part of a routine examination, annually, after age 63 or as directed by your caregiver. The fecal occult blood test (FOBT) checks for blood in your stool. Normally, there will not be enough blood lost through the gastrointestinal tract to turn an FOBT positive or for you to notice it visually in the form of bloody or dark, tarry stools. Any significant amount of blood being passed should be investigated.  A positive FOBT will tell your caregiver that you have bleeding occurring somewhere in your gastrointestinal tract. This blood loss could be due to ulcers, diverticulosis, bleeding polyps, inflammatory bowel disease, hemorrhoids, from swallowed blood due to bleeding gums or nosebleeds, or it could be due to benign or cancerous tumors. Anything that protrudes into the lumen (the empty space in the intestine), like a polyp or tumor, and is rubbed against by the fecal waste as it passes through has the potential to eventually bleed intermittently. Often this small amount of blood is the first, and sometimes the only, symptom of early colon cancer, making the FOBT a valuable screening tool. PREPARATION FOR TEST  You should not eat red meat within three days before testing. Other substances that could cause a false positive test result include fish, turnips, horseradish, and drugs such as colchicines and oxidizing drugs (for example, iodine and boric acid). Be sure to carefully follow your caregiver's instructions. With FOBT, your caregiver or laboratory will give you one or more test "cards." You collect a separate sample from three different stools, usually on consecutive days. Each stool sample should be collected into a clean container and should not be contaminated with urine or water. The slide is labeled with your name and  the date; then, with an applicator stick, you apply a thin smear of stool onto each filter paper square/window contained on the card. Allow the filter paper to dry. Once it is dry, it is stable. Usually you will collect all of the consecutive samples, and then return all of them to your caregiver or laboratory at the same time, sometimes by mailing them. There are also over the counter tests which are dropped in your toilet. NORMAL FINDINGS   No occult blood within the stool.  The FOBT test is normally negative. A positive indicates either blood in the stool or an interfering substance. Multiple samples are done to: 1) catch intermittent bleeding; and 2) help rule out false positives. Ranges for normal findings may vary among different laboratories and hospitals. You should always check with your doctor after having lab work or other tests done to discuss the meaning of your test results and whether your values are considered within normal limits. MEANING OF TEST  Your caregiver will go over the test results with you and discuss the importance and meaning of your results, as well as treatment options and the need for additional tests if necessary. OBTAINING THE TEST RESULTS  It is your responsibility to obtain your test results. Ask the lab or department performing the test when and how you will get your results. Document Released: 11/10/2004 Document Revised: 01/08/2012 Document Reviewed: 09/25/2008 [facility] Patient Information 2015 [place], [place]. This information is not intended to replace advice given to you by your health care provider. Make sure you discuss any questions you have with your health care provider.

## 2014-06-15 NOTE — Progress Notes (Signed)
Pre visit review using our clinic review tool, if applicable. No additional management support is needed unless otherwise documented below in the visit note. 

## 2014-06-15 NOTE — Progress Notes (Signed)
Subjective:    Patient ID: Preston Pennington, male    DOB: 1951-06-02, 63 y.o.   MRN: [id number]  HPI 63 year old African male, nonsmoker, is in today for a CPX. He has a history of Type 2 DM, Hypertension, CAD, and Hyperlipidemia. Reports being well controlled. Does not routinely exercise. Follows a healthy diet.   Patient presents for yearly preventative medicine examination. Medicare questionnaire was completed  All immunizations and health maintenance protocols were reviewed with the patient and needed orders were placed.  Appropriate screening laboratory values were ordered for the patient including screening of hyperlipidemia, renal function and hepatic function. If indicated by BPH, a PSA was ordered.  Medication reconciliation,  past medical history, social history, problem list and allergies were reviewed in detail with the patient  Goals were established with regard to weight loss, exercise, and  diet in compliance with medications    Review of Systems  Constitutional: Negative.   HENT: Negative.   Eyes: Negative.   Respiratory: Negative.   Cardiovascular: Negative.   Gastrointestinal: Negative.   Endocrine: Negative.   Genitourinary: Negative.   Musculoskeletal: Negative.   Skin: Negative.   Allergic/Immunologic: Negative.   Neurological: Negative.   Hematological: Negative.   Psychiatric/Behavioral: Negative.    Past Medical History  Diagnosis Date  . Hypertension   . Diabetes mellitus without complication   . Prostate enlargement 2010    See's Urologist     History   Social History  . Marital Status: Married    Spouse Name: N/A    Number of Children: N/A  . Years of Education: N/A   Occupational History  . Not on file.   Social History Main Topics  . Smoking status: Never Smoker   . Smokeless tobacco: Not on file  . Alcohol Use: No  . Drug Use: No  . Sexual Activity: Not on file   Other Topics Concern  . Not on file   Social History Narrative    . No narrative on file    Past Surgical History  Procedure Laterality Date  . Leg surgery Right 1997    History reviewed. No pertinent family history.  No Known Allergies  Current Outpatient Prescriptions on File Prior to Visit  Medication Sig Dispense Refill  . amLODipine (NORVASC) 10 MG tablet Take 1 tablet (10 mg total) by mouth daily.  30 tablet  0  . aspirin EC 81 MG tablet Take 81 mg by mouth daily.      . Multiple Vitamins-Minerals (MULTIVITAMIN PO) Take 1 tablet by mouth daily.       No current facility-administered medications on file prior to visit.    There were no vitals taken for this visit.chart    Objective:   Physical Exam  Constitutional: He is oriented to person, place, and time. He appears well-developed and well-nourished.  HENT:  Head: Normocephalic and atraumatic.  Right Ear: External ear normal.  Left Ear: External ear normal.  Nose: Nose normal.  Mouth/Throat: Oropharynx is clear and moist.  Eyes: Conjunctivae and EOM are normal. Pupils are equal, round, and reactive to light.  Neck: Normal range of motion. Neck supple. No thyromegaly present.  Cardiovascular: Normal rate, regular rhythm and normal heart sounds.   Pulmonary/Chest: Effort normal and breath sounds normal.  Abdominal: Soft. Bowel sounds are normal.  Genitourinary: Prostate normal and penis normal. Guaiac positive stool. No penile tenderness.  Musculoskeletal: Normal range of motion.  Neurological: He is alert and oriented to person, place, and  time. He has normal reflexes.  Skin: Skin is warm and dry.  Psychiatric: He has a normal mood and affect.          Assessment & Plan:  Preston Pennington was seen today for annual exam.  Diagnoses and associated orders for this visit:  Preventative health care - CBC with Differential - Ambulatory referral to Gastroenterology - POC Urinalysis Dipstick  Unspecified essential hypertension - EKG 74-MOLM - Basic Metabolic Panel - Hepatic Function  Panel - POC Urinalysis Dipstick  Type II or unspecified type diabetes mellitus without mention of complication, not stated as uncontrolled - Hemoglobin A1c - CBC with Differential - Basic Metabolic Panel - Hepatic Function Panel - POC Urinalysis Dipstick  Hematest positive stools  Hypercholesteremia - Lipid Panel - Basic Metabolic Panel - Hepatic Function Panel  Screening for prostate cancer - PSA  Other Orders - tamsulosin (FLOMAX) 0.4 MG CAPS capsule; Take 1 capsule (0.4 mg total) by mouth daily after supper. - quinapril (ACCUPRIL) 20 MG tablet; Take 1 tablet (20 mg total) by mouth at bedtime. - metFORMIN (GLUCOPHAGE) 500 MG tablet; Take 1 tablet (500 mg total) by mouth 2 (two) times daily with a meal. - glipiZIDE (GLUCOTROL) 5 MG tablet; Take 1 tablet (5 mg total) by mouth daily before breakfast. - atenolol (TENORMIN) 100 MG tablet; Take 1 tablet (100 mg total) by mouth daily. - glucose blood (ACCU-CHEK SMARTVIEW) test strip; Use as instructed to check blood sugar once a day   Call the office with any questions or concerns. Recheck in 4 months and sooner as needed

## 2014-06-16 ENCOUNTER — Telehealth: Payer: Self-pay | Admitting: Family

## 2014-06-16 NOTE — Telephone Encounter (Signed)
Relevant patient education assigned to patient using Emmi. ° °

## 2014-06-18 ENCOUNTER — Telehealth: Payer: Self-pay | Admitting: Family

## 2014-06-18 NOTE — Telephone Encounter (Signed)
Type of Insurance:UHC  copy of Insurance Card on File? YES  Patient's ETK:[id number] CAMPBELL  PCP's HKU:[id number]  Treating Provider:DR FRED REID Treating Provider's OIP:[id number]  Treating Provider's Phone Number:[phone]  Treating Provider's Fax [place]  Reason for Visit (diagnosis): BPH 600.00 and elevated PSA PT HAS SEEN DR REID AND PRIMARY DX BPH PT HAS APPT TOMORROW 06-19-14

## 2014-06-19 NOTE — Telephone Encounter (Signed)
Dr . Jamison Oka is not in the [facility] [employer] [facility] was not able to do authorization informed pt of this . He states he will keep his scheduled appt with [facility]

## 2014-07-20 ENCOUNTER — Encounter: Payer: Self-pay | Admitting: Family

## 2014-09-03 ENCOUNTER — Telehealth: Payer: Self-pay | Admitting: Family

## 2014-09-03 NOTE — Telephone Encounter (Signed)
Opened in error

## 2014-09-23 ENCOUNTER — Other Ambulatory Visit: Payer: Self-pay | Admitting: Family

## 2014-09-28 ENCOUNTER — Encounter: Payer: Self-pay | Admitting: Nurse Practitioner

## 2014-09-28 ENCOUNTER — Ambulatory Visit (INDEPENDENT_AMBULATORY_CARE_PROVIDER_SITE_OTHER): Payer: 59 | Admitting: Nurse Practitioner

## 2014-09-28 VITALS — BP 145/72 | HR 96 | Temp 98.1°F | Resp 18 | Ht 67.0 in | Wt 153.0 lb

## 2014-09-28 DIAGNOSIS — B9789 Other viral agents as the cause of diseases classified elsewhere: Principal | ICD-10-CM

## 2014-09-28 DIAGNOSIS — J069 Acute upper respiratory infection, unspecified: Secondary | ICD-10-CM

## 2014-09-28 MED ORDER — HYDROCODONE-HOMATROPINE 5-1.5 MG/5ML PO SYRP: 5.0000 mL | ORAL_SOLUTION | Freq: Every evening | ORAL | Status: DC | PRN

## 2014-09-28 NOTE — Patient Instructions (Addendum)
You have a cold virus causing your symptoms. The average duration of cold symptoms is 14 days. Start daily sinus rinses (neilmed Sinus Rinse) use daily until mucous runs clear. Sip fluids every hour. Rest. If you are not feeling better in 10 days or develop fever or chest pain, call us for re-evaluation. Feel better!  Upper Respiratory Infection, Adult An upper respiratory infection (URI) is also sometimes known as the common cold. The upper respiratory tract includes the nose, sinuses, throat, trachea, and bronchi. Bronchi are the airways leading to the lungs. Most people improve within 1 week, but symptoms can last up to 2 weeks. A residual cough may last even longer.  CAUSES Many different viruses can infect the tissues lining the upper respiratory tract. The tissues become irritated and inflamed and often become very moist. Mucus production is also common. A cold is contagious. You can easily spread the virus to others by oral contact. This includes kissing, sharing a glass, coughing, or sneezing. Touching your mouth or nose and then touching a surface, which is then touched by another person, can also spread the virus. SYMPTOMS  Symptoms typically develop 1 to 3 days after you come in contact with a cold virus. Symptoms vary from person to person. They may include:  Runny nose.  Sneezing.  Nasal congestion.  Sinus irritation.  Sore throat.  Loss of voice (laryngitis).  Cough.  Fatigue.  Muscle aches.  Loss of appetite.  Headache.  Low-grade fever. DIAGNOSIS  You might diagnose your own cold based on familiar symptoms, since most people get a cold 2 to 3 times a year. Your caregiver can confirm this based on your exam. Most importantly, your caregiver can check that your symptoms are not due to another disease such as strep throat, sinusitis, pneumonia, asthma, or epiglottitis. Blood tests, throat tests, and X-rays are not necessary to diagnose a common cold, but they may  sometimes be helpful in excluding other more serious diseases. Your caregiver will decide if any further tests are required. RISKS AND COMPLICATIONS  You may be at risk for a more severe case of the common cold if you smoke cigarettes, have chronic heart disease (such as heart failure) or lung disease (such as asthma), or if you have a weakened immune system. The very young and very old are also at risk for more serious infections. Bacterial sinusitis, middle ear infections, and bacterial pneumonia can complicate the common cold. The common cold can worsen asthma and chronic obstructive pulmonary disease (COPD). Sometimes, these complications can require emergency medical care and may be life-threatening. PREVENTION  The best way to protect against getting a cold is to practice good hygiene. Avoid oral or hand contact with people with cold symptoms. Wash your hands often if contact occurs. There is no clear evidence that vitamin C, vitamin E, echinacea, or exercise reduces the chance of developing a cold. However, it is always recommended to get plenty of rest and practice good nutrition. TREATMENT  Treatment is directed at relieving symptoms. There is no cure. Antibiotics are not effective, because the infection is caused by a virus, not by bacteria. Treatment may include:  Increased fluid intake. Sports drinks offer valuable electrolytes, sugars, and fluids.  Breathing heated mist or steam (vaporizer or shower).  Eating chicken soup or other clear broths, and maintaining good nutrition.  Getting plenty of rest.  Using gargles or lozenges for comfort.  Controlling fevers with ibuprofen or acetaminophen as directed by your caregiver.  Increasing usage of  your inhaler if you have asthma. Zinc gel and zinc lozenges, taken in the first 24 hours of the common cold, can shorten the duration and lessen the severity of symptoms. Pain medicines may help with fever, muscle aches, and throat pain. A  variety of non-prescription medicines are available to treat congestion and runny nose. Your caregiver can make recommendations and may suggest nasal or lung inhalers for other symptoms.  HOME CARE INSTRUCTIONS   Only take over-the-counter or prescription medicines for pain, discomfort, or fever as directed by your caregiver.  Use a warm mist humidifier or inhale steam from a shower to increase air moisture. This may keep secretions moist and make it easier to breathe.  Drink enough water and fluids to keep your urine clear or pale yellow.  Rest as needed.  Return to work when your temperature has returned to normal or as your caregiver advises. You may need to stay home longer to avoid infecting others. You can also use a face mask and careful hand washing to prevent spread of the virus. SEEK MEDICAL CARE IF:   After the first few days, you feel you are getting worse rather than better.  You need your caregiver's advice about medicines to control symptoms.  You develop chills, worsening shortness of breath, or brown or red sputum. These may be signs of pneumonia.  You develop yellow or brown nasal discharge or pain in the face, especially when you bend forward. These may be signs of sinusitis.  You develop a fever, swollen neck glands, pain with swallowing, or white areas in the back of your throat. These may be signs of strep throat. SEEK IMMEDIATE MEDICAL CARE IF:   You have a fever.  You develop severe or persistent headache, ear pain, sinus pain, or chest pain.  You develop wheezing, a prolonged cough, cough up blood, or have a change in your usual mucus (if you have chronic lung disease).  You develop sore muscles or a stiff neck. Document Released: 04/11/2001 Document Revised: 01/08/2012 Document Reviewed: 02/17/2011 [facility] Patient Information 2014 [place], [place].

## 2014-09-28 NOTE — Progress Notes (Signed)
Pre visit review using our clinic review tool, if applicable. No additional management support is needed unless otherwise documented below in the visit note. 

## 2014-09-29 NOTE — Progress Notes (Signed)
   Subjective:    Patient ID: Preston Pennington, male    DOB: November 26, 1950, 63 y.o.   MRN: [id number]  Cough This is a new problem. The current episode started in the past 7 days (4d). The problem has been unchanged. The cough is non-productive. Associated symptoms include headaches, nasal congestion and postnasal drip. Pertinent negatives include no chest pain, chills, ear congestion, fever, myalgias, sore throat, shortness of breath or wheezing. The symptoms are aggravated by lying down. He has tried OTC cough suppressant (antihistamine) for the symptoms. The treatment provided no relief.      Review of Systems  Constitutional: Negative for fever, chills, appetite change and fatigue.  HENT: Positive for congestion, postnasal drip and sinus pressure. Negative for sore throat.   Respiratory: Positive for cough. Negative for chest tightness, shortness of breath and wheezing.   Cardiovascular: Negative for chest pain.  Gastrointestinal: Negative for abdominal pain.  Musculoskeletal: Negative for myalgias and back pain.  Neurological: Positive for headaches.       Objective:   Physical Exam  Constitutional: He is oriented to person, place, and time. He appears well-developed and well-nourished. No distress.  HENT:  Head: Normocephalic and atraumatic.  Right Ear: External ear normal.  Left Ear: External ear normal.  Mouth/Throat: Oropharynx is clear and moist. No oropharyngeal exudate.  clear nasal d/c, clear fluid RTM, bones visible. Nml L TM.  Eyes: Conjunctivae are normal. Right eye exhibits no discharge. Left eye exhibits no discharge.  Neck: Normal range of motion. Neck supple. No thyromegaly present.  Cardiovascular: Normal rate, regular rhythm and normal heart sounds.   No murmur heard. Pulmonary/Chest: Effort normal and breath sounds normal. No respiratory distress. He has no wheezes. He has no rales.  Lymphadenopathy:    He has no cervical adenopathy.  Neurological: He is alert and  oriented to person, place, and time.  Skin: Skin is warm and dry.  Psychiatric: He has a normal mood and affect. His behavior is normal. Thought content normal.  Vitals reviewed.         Assessment & Plan:  1. Viral upper respiratory tract infection with cough Symptoms management. See pt instructions. - HYDROcodone-homatropine (HYCODAN) 5-1.5 MG/5ML syrup; Take 5 mLs by mouth at bedtime as needed for cough.  Dispense: 120 mL; Refill: 0 F/u PRN fever &/or chest pain

## 2014-11-25 ENCOUNTER — Other Ambulatory Visit: Payer: Self-pay | Admitting: Family

## 2014-12-01 ENCOUNTER — Telehealth: Payer: Self-pay | Admitting: Family

## 2014-12-01 DIAGNOSIS — Z8611 Personal history of tuberculosis: Secondary | ICD-10-CM

## 2014-12-01 NOTE — Telephone Encounter (Signed)
Pt is aware.  

## 2014-12-01 NOTE — Telephone Encounter (Signed)
X-ray ordered for [employer]

## 2014-12-01 NOTE — Telephone Encounter (Signed)
Pt has tested positive  For tb in the past. Can we order chest xray

## 2014-12-02 ENCOUNTER — Ambulatory Visit (INDEPENDENT_AMBULATORY_CARE_PROVIDER_SITE_OTHER)
Admission: RE | Admit: 2014-12-02 | Discharge: 2014-12-02 | Disposition: A | Discharge status: Home / Self Care | Payer: 59 | Source: Ambulatory Visit | Attending: Family | Admitting: Family

## 2014-12-02 DIAGNOSIS — Z8611 Personal history of tuberculosis: Secondary | ICD-10-CM

## 2014-12-03 ENCOUNTER — Telehealth: Payer: Self-pay | Admitting: Family

## 2014-12-03 NOTE — Telephone Encounter (Signed)
Pt would like results of xray and a copy of the results done yesterday.  pls cb

## 2014-12-03 NOTE — Telephone Encounter (Signed)
Pt aware.

## 2015-01-26 ENCOUNTER — Other Ambulatory Visit: Payer: Self-pay | Admitting: Family

## 2015-02-04 ENCOUNTER — Encounter: Payer: Self-pay | Admitting: Family

## 2015-02-04 ENCOUNTER — Ambulatory Visit (INDEPENDENT_AMBULATORY_CARE_PROVIDER_SITE_OTHER): Payer: 59 | Admitting: Family

## 2015-02-04 VITALS — BP 110/60 | HR 74 | Temp 98.4°F | Wt 153.7 lb

## 2015-02-04 DIAGNOSIS — E7800 Pure hypercholesterolemia, unspecified: Secondary | ICD-10-CM

## 2015-02-04 DIAGNOSIS — E119 Type 2 diabetes mellitus without complications: Secondary | ICD-10-CM

## 2015-02-04 DIAGNOSIS — I10 Essential (primary) hypertension: Secondary | ICD-10-CM | POA: Diagnosis not present

## 2015-02-04 DIAGNOSIS — N529 Male erectile dysfunction, unspecified: Secondary | ICD-10-CM | POA: Insufficient documentation

## 2015-02-04 DIAGNOSIS — N400 Enlarged prostate without lower urinary tract symptoms: Secondary | ICD-10-CM

## 2015-02-04 LAB — LIPID PANEL
Cholesterol: 168 mg/dL (ref 0–200)
HDL: 48.1 mg/dL (ref >39.00)
LDL Cholesterol: 106 mg/dL — ABNORMAL HIGH (ref 0–99)
NONHDL: 119.9
TRIGLYCERIDES: 72 mg/dL (ref 0.0–149.0)
Total CHOL/HDL Ratio: 3
VLDL: 14.4 mg/dL (ref 0.0–40.0)

## 2015-02-04 LAB — CBC WITH DIFFERENTIAL/PLATELET
BASOS ABS: 0 10*3/uL (ref 0.0–0.1)
BASOS PCT: 0.5 % (ref 0.0–3.0)
Eosinophils Absolute: 0.4 10*3/uL (ref 0.0–0.7)
Eosinophils Relative: 8.4 % — ABNORMAL HIGH (ref 0.0–5.0)
HEMATOCRIT: 44.2 % (ref 39.0–52.0)
Hemoglobin: 14.4 g/dL (ref 13.0–17.0)
Lymphocytes Relative: 39 % (ref 12.0–46.0)
Lymphs Abs: 1.7 10*3/uL (ref 0.7–4.0)
MCHC: 32.5 g/dL (ref 30.0–36.0)
MCV: 80.3 fl (ref 78.0–100.0)
MONOS PCT: 6.3 % (ref 3.0–12.0)
Monocytes Absolute: 0.3 10*3/uL (ref 0.1–1.0)
NEUTROS ABS: 2 10*3/uL (ref 1.4–7.7)
Neutrophils Relative %: 45.8 % (ref 43.0–77.0)
PLATELETS: 104 10*3/uL — AB (ref 150.0–400.0)
RBC: 5.51 Mil/uL (ref 4.22–5.81)
RDW: 13.4 % (ref 11.5–15.5)
WBC: 4.3 10*3/uL (ref 4.0–10.5)

## 2015-02-04 LAB — BASIC METABOLIC PANEL
BUN: 15 mg/dL (ref 6–23)
CALCIUM: 9.5 mg/dL (ref 8.4–10.5)
CO2: 30 mEq/L (ref 19–32)
Chloride: 105 mEq/L (ref 96–112)
Creatinine, Ser: 0.93 mg/dL (ref 0.40–1.50)
GFR: 105.03 mL/min (ref >60.00)
GLUCOSE: 127 mg/dL — AB (ref 70–99)
Potassium: 4.2 mEq/L (ref 3.5–5.1)
SODIUM: 139 meq/L (ref 135–145)

## 2015-02-04 LAB — HEPATIC FUNCTION PANEL
ALK PHOS: 69 U/L (ref 39–117)
ALT: 17 U/L (ref 0–53)
AST: 15 U/L (ref 0–37)
Albumin: 4 g/dL (ref 3.5–5.2)
Bilirubin, Direct: 0.1 mg/dL (ref 0.0–0.3)
TOTAL PROTEIN: 7.1 g/dL (ref 6.0–8.3)
Total Bilirubin: 0.5 mg/dL (ref 0.2–1.2)

## 2015-02-04 LAB — HEMOGLOBIN A1C: Hgb A1c MFr Bld: 5.2 % (ref 4.6–6.5)

## 2015-02-04 NOTE — Progress Notes (Signed)
Subjective:    Patient ID: Preston Pennington, male    DOB: 27-Aug-1951, 64 y.o.   MRN: [id number]  HPI 64 year old African male, nonsmoker with a history of type 2 diabetes, hypertension, hyperlipidemia, and benign prostatic hyperplasia is in today for recheck. Is under the care of urology for BPH. Reports blood glucoses are typically 120 or below in the mornings, fasting. Has concerns of erectile dysfunction and is concerned that atenolol is the cause. Has difficulty maintaining erections. He is able to achieve. Denies any frequency or urgency to urinate.   Review of Systems  Constitutional: Negative.   HENT: Negative.   Respiratory: Negative.   Cardiovascular: Negative.   Gastrointestinal: Negative.   Endocrine: Negative.   Genitourinary: Negative for dysuria, frequency, discharge and penile pain.       Erectile dysfunction   Musculoskeletal: Negative.   Skin: Negative.   Allergic/Immunologic: Negative.   Neurological: Negative.   Psychiatric/Behavioral: Negative.    Past Medical History  Diagnosis Date  . Hypertension   . Diabetes mellitus without complication   . Prostate enlargement 2010    See's Urologist     History   Social History  . Marital Status: Married    Spouse Name: N/A  . Number of Children: N/A  . Years of Education: N/A   Occupational History  . Not on file.   Social History Main Topics  . Smoking status: Never Smoker   . Smokeless tobacco: Not on file  . Alcohol Use: No  . Drug Use: No  . Sexual Activity: Not on file   Other Topics Concern  . Not on file   Social History Narrative    Past Surgical History  Procedure Laterality Date  . Leg surgery Right 1997    History reviewed. No pertinent family history.  No Known Allergies  Current Outpatient Prescriptions on File Prior to Visit  Medication Sig Dispense Refill  . amLODipine (NORVASC) 10 MG tablet TAKE ONE TABLET BY MOUTH ONCE DAILY. NEED OFFICE VISIT 30 tablet 2  . glipiZIDE  (GLUCOTROL) 5 MG tablet TAKE ONE TABLET BY MOUTH ONCE DAILY 30 tablet 1  . glucose blood (ACCU-CHEK SMARTVIEW) test strip Use as instructed to check blood sugar once a day 100 each 3  . metFORMIN (GLUCOPHAGE) 500 MG tablet Take 1 tablet (500 mg total) by mouth 2 (two) times daily with a meal. 180 tablet 1  . Multiple Vitamins-Minerals (MULTIVITAMIN PO) Take 1 tablet by mouth daily.    . quinapril (ACCUPRIL) 20 MG tablet TAKE ONE TABLET BY MOUTH AT BEDTIME 30 tablet 1  . tamsulosin (FLOMAX) 0.4 MG CAPS capsule TAKE ONE CAPSULE BY MOUTH ONCE DAILY AFTER  SUPPER 90 capsule 0  . aspirin EC 81 MG tablet Take 81 mg by mouth daily.     No current facility-administered medications on file prior to visit.    BP 110/60 mmHg  Pulse 74  Temp(Src) 98.4 F (36.9 C) (Oral)  Wt 153 lb 11.2 oz (69.718 kg)chart     Objective:   Physical Exam  Constitutional: He is oriented to person, place, and time. He appears well-developed and well-nourished.  HENT:  Right Ear: External ear normal.  Left Ear: External ear normal.  Nose: Nose normal.  Mouth/Throat: Oropharynx is clear and moist.  Neck: Normal range of motion. Neck supple.  Cardiovascular: Normal rate, regular rhythm and normal heart sounds.   Pulmonary/Chest: Effort normal and breath sounds normal.  Abdominal: Soft. Bowel sounds are normal.  Musculoskeletal: Normal range  of motion.  Neurological: He is alert and oriented to person, place, and time.  Skin: Skin is warm and dry.  Psychiatric: He has a normal mood and affect.          Assessment & Plan:  Preston Pennington was seen today for follow-up.  Diagnoses and all orders for this visit:  Type 2 diabetes mellitus without complication Orders: -     Hemoglobin A1c -     CBC with Differential  Essential hypertension Orders: -     Basic Metabolic Panel -     Hepatic Function Panel  Pure hypercholesterolemia Orders: -     Basic Metabolic Panel -     Hepatic Function Panel -     CBC with  Differential -     Lipid Panel  Erectile dysfunction, unspecified erectile dysfunction type Orders: -     CBC with Differential  BPH (benign prostatic hyperplasia)   Encouraged healthy diet and exercise. Stop atenolol. Blood pressure is low today and we will see how he responds then consider increase in one of his blood pressure medications. We will send blood pressure readings in 2 weeks through my chart. Recheck in 4 months and sooner as needed.

## 2015-02-04 NOTE — Patient Instructions (Signed)
Diabetes and Standards of Medical Care Diabetes is complicated. You may find that your diabetes team includes a dietitian, nurse, diabetes educator, eye doctor, and more. To help everyone know what is going on and to help you get the care you deserve, the following schedule of care was developed to help keep you on track. Below are the tests, exams, vaccines, medicines, education, and plans you will need. HbA1c test This test shows how well you have controlled your glucose over the past 2-3 months. It is used to see if your diabetes management plan needs to be adjusted.   It is performed at least 2 times a year if you are meeting treatment goals.  It is performed 4 times a year if therapy has changed or if you are not meeting treatment goals. Blood pressure test  This test is performed at every routine medical visit. The goal is less than 140/90 mm Hg for most people, but 130/80 mm Hg in some cases. Ask your health care provider about your goal. Dental exam  Follow up with the dentist regularly. Eye exam  If you are diagnosed with type 1 diabetes as a child, get an exam upon reaching the age of 65 years or older and have had diabetes for 3-5 years. Yearly eye exams are recommended after that initial eye exam.  If you are diagnosed with type 1 diabetes as an adult, get an exam within 5 years of diagnosis and then yearly.  If you are diagnosed with type 2 diabetes, get an exam as soon as possible after the diagnosis and then yearly. Foot care exam  Visual foot exams are performed at every routine medical visit. The exams check for cuts, injuries, or other problems with the feet.  A comprehensive foot exam should be done yearly. This includes visual inspection as well as assessing foot pulses and testing for loss of sensation.  Check your feet nightly for cuts, injuries, or other problems with your feet. Tell your health care provider if anything is not healing. Kidney function test (urine  microalbumin)  This test is performed once a year.  Type 1 diabetes: The first test is performed 5 years after diagnosis.  Type 2 diabetes: The first test is performed at the time of diagnosis.  A serum creatinine and estimated glomerular filtration rate (eGFR) test is done once a year to assess the level of chronic kidney disease (CKD), if present. Lipid profile (cholesterol, HDL, LDL, triglycerides)  Performed every 5 years for most people.  The goal for LDL is less than 100 mg/dL. If you are at high risk, the goal is less than 70 mg/dL.  The goal for HDL is 40 mg/dL-50 mg/dL for men and 50 mg/dL-60 mg/dL for women. An HDL cholesterol of 60 mg/dL or higher gives some protection against heart disease.  The goal for triglycerides is less than 150 mg/dL. Influenza vaccine, pneumococcal vaccine, and hepatitis B vaccine  The influenza vaccine is recommended yearly.  It is recommended that people with diabetes who are over 65 years old get the pneumonia vaccine. In some cases, two separate shots may be given. Ask your health care provider if your pneumonia vaccination is up to date.  The hepatitis B vaccine is also recommended for adults with diabetes. Diabetes self-management education  Education is recommended at diagnosis and ongoing as needed. Treatment plan  Your treatment plan is reviewed at every medical visit. Document Released: 08/13/2009 Document Revised: 03/02/2014 Document Reviewed: 03/18/2013 [facility] Patient Information 2015 [place],  [employer]. This information is not intended to replace advice given to you by your health care provider. Make sure you discuss any questions you have with your health care provider.  

## 2015-02-24 ENCOUNTER — Encounter: Payer: Self-pay | Admitting: Family

## 2015-02-24 ENCOUNTER — Ambulatory Visit (INDEPENDENT_AMBULATORY_CARE_PROVIDER_SITE_OTHER): Payer: 59 | Admitting: Family

## 2015-02-24 VITALS — BP 100/70 | HR 81 | Temp 98.1°F | Wt 154.0 lb

## 2015-02-24 DIAGNOSIS — H1132 Conjunctival hemorrhage, left eye: Secondary | ICD-10-CM

## 2015-02-24 DIAGNOSIS — I10 Essential (primary) hypertension: Secondary | ICD-10-CM

## 2015-02-24 NOTE — Progress Notes (Signed)
Pre visit review using our clinic review tool, if applicable. No additional management support is needed unless otherwise documented below in the visit note. 

## 2015-02-24 NOTE — Patient Instructions (Signed)
Subconjunctival Hemorrhage °A subconjunctival hemorrhage is a bright red patch covering a portion of the white of the eye. The white part of the eye is called the sclera, and it is covered by a thin membrane called the conjunctiva. This membrane is clear, except for tiny blood vessels that you can see with the naked eye. When your eye is irritated or inflamed and becomes red, it is because the vessels in the conjunctiva are swollen. °Sometimes, a blood vessel in the conjunctiva can break and bleed. When this occurs, the blood builds up between the conjunctiva and the sclera, and spreads out to create a red area. The red spot may be very small at first. It may then spread to cover a larger part of the surface of the eye, or even all of the visible white part of the eye. °In almost all cases, the blood will go away and the eye will become white again. Before completely dissolving, however, the red area may spread. It may also become brownish-yellow in color before going away. If a lot of blood collects under the conjunctiva, it may look like a bulge on the surface of the eye. This looks scary, but it will also eventually flatten out and go away. Subconjunctival hemorrhages do not cause pain, but if swollen, may cause a feeling of irritation. There is no effect on vision.  °CAUSES  °· The most common cause is mild trauma (rubbing the eye, irritation). °· Subconjunctival hemorrhages can happen because of coughing or straining (lifting heavy objects), vomiting, or sneezing. °· In some cases, your doctor may want to check your blood pressure. High blood pressure can also cause a subconjunctival hemorrhage. °· Severe trauma or blunt injuries. °· Diseases that affect blood clotting (hemophilia, leukemia). °· Abnormalities of blood vessels behind the eye (carotid cavernous sinus fistula). °· Tumors behind the eye. °· Certain drugs (aspirin, Coumadin, heparin). °· Recent eye surgery. °HOME CARE INSTRUCTIONS  °· Do not worry  about the appearance of your eye. You may continue your usual activities. °· Often, follow-up is not necessary. °SEEK MEDICAL CARE IF:  °· Your eye becomes painful. °· The bleeding does not disappear within 3 weeks. °· Bleeding occurs elsewhere, for example, under the skin, in the mouth, or in the other eye. °· You have recurring subconjunctival hemorrhages. °SEEK IMMEDIATE MEDICAL CARE IF:  °· Your vision changes or you have difficulty seeing. °· You develop a severe headache, persistent vomiting, confusion, or abnormal drowsiness (lethargy). °· Your eye seems to bulge or protrude from the eye socket. °· You notice the sudden appearance of bruises or have spontaneous bleeding elsewhere on your body. °Document Released: 10/16/2005 Document Revised: 03/02/2014 Document Reviewed: 09/13/2009 °[employer]® Patient Information ©2015 [employer]. This information is not intended to replace advice given to you by your health care provider. Make sure you discuss any questions you have with your health care provider. ° °

## 2015-02-24 NOTE — Progress Notes (Signed)
Subjective:    Patient ID: Preston Pennington, male    DOB: [birth year], 64 y.o.   MRN: [id number]  HPI  64 year old AAM is in today with c/o a red left eye. Reports waking up like this 3 days ago but it hasn't cleared. Denied blurred vision or visual changes. No matting or crusting. No injury. No trauma. No sneezing, coughing or congestion.   Review of Systems  Constitutional: Negative.   HENT: Negative.   Eyes: Positive for redness. Negative for photophobia, pain, discharge and visual disturbance.  Respiratory: Negative.   Cardiovascular: Negative.   Skin: Negative.   Allergic/Immunologic: Negative.    Past Medical History  Diagnosis Date  . Hypertension   . Diabetes mellitus without complication   . Prostate enlargement 2010    See's Urologist     History   Social History  . Marital Status: Married    Spouse Name: N/A  . Number of Children: N/A  . Years of Education: N/A   Occupational History  . Not on file.   Social History Main Topics  . Smoking status: Never Smoker   . Smokeless tobacco: Not on file  . Alcohol Use: No  . Drug Use: No  . Sexual Activity: Not on file   Other Topics Concern  . Not on file   Social History Narrative    Past Surgical History  Procedure Laterality Date  . Leg surgery Right 1997    History reviewed. No pertinent family history.  No Known Allergies  Current Outpatient Prescriptions on File Prior to Visit  Medication Sig Dispense Refill  . amLODipine (NORVASC) 10 MG tablet TAKE ONE TABLET BY MOUTH ONCE DAILY. NEED OFFICE VISIT 30 tablet 2  . glipiZIDE (GLUCOTROL) 5 MG tablet TAKE ONE TABLET BY MOUTH ONCE DAILY 30 tablet 1  . glucose blood (ACCU-CHEK SMARTVIEW) test strip Use as instructed to check blood sugar once a day 100 each 3  . metFORMIN (GLUCOPHAGE) 500 MG tablet Take 1 tablet (500 mg total) by mouth 2 (two) times daily with a meal. 180 tablet 1  . Multiple Vitamins-Minerals (MULTIVITAMIN PO) Take 1 tablet by mouth daily.     . quinapril (ACCUPRIL) 20 MG tablet TAKE ONE TABLET BY MOUTH AT BEDTIME 30 tablet 1  . tamsulosin (FLOMAX) 0.4 MG CAPS capsule TAKE ONE CAPSULE BY MOUTH ONCE DAILY AFTER  SUPPER 90 capsule 0  . aspirin EC 81 MG tablet Take 81 mg by mouth daily.     No current facility-administered medications on file prior to visit.    BP 100/70 mmHg  Pulse 81  Temp(Src) 98.1 F (36.7 C) (Oral)  Wt 154 lb (69.854 kg)chart    Objective:   Physical Exam  Constitutional: He is oriented to person, place, and time. He appears well-developed and well-nourished.  HENT:  Right Ear: External ear normal.  Left Ear: External ear normal.  Eyes: EOM are normal. Pupils are equal, round, and reactive to light.    Cardiovascular: Normal rate, regular rhythm and normal heart sounds.   Pulmonary/Chest: Effort normal and breath sounds normal.  Neurological: He is alert and oriented to person, place, and time.  Skin: Skin is warm and dry.          Assessment & Plan:  Diagnoses and all orders for this visit:  Subconjunctival hemorrhage of left eye Orders: -     Cancel: DG Chest 2 View; Future  Essential hypertension   Comfort meassure provided. Discussed with Dr. Sarajane Jews. Call  the office with any questions or concerns.

## 2015-02-25 ENCOUNTER — Other Ambulatory Visit: Payer: Self-pay

## 2015-02-25 ENCOUNTER — Other Ambulatory Visit: Payer: Self-pay | Admitting: Family

## 2015-02-25 ENCOUNTER — Telehealth: Payer: Self-pay | Admitting: Family

## 2015-02-25 MED ORDER — NEOMYCIN-POLYMYXIN-HC 3.5-10000-1 OP SUSP: 1.0000 [drp] | Freq: Four times a day (QID) | OPHTHALMIC | Status: DC

## 2015-02-25 NOTE — Telephone Encounter (Addendum)
Pt states he was to get an eye rx from appt yesterday, but nothing was at the pharm. Pt would like as soon as possible. 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

## 2015-02-25 NOTE — Telephone Encounter (Signed)
Left message to advise pt that I spoke with [place]. As discussed at [place], there is no medication needed as the subconjunctival hemorrhage of the left eye will resolve on its own. Advised pt to call back with questions or concerns

## 2015-04-26 ENCOUNTER — Telehealth: Payer: Self-pay | Admitting: Family

## 2015-04-26 MED ORDER — TAMSULOSIN HCL 0.4 MG PO CAPS: ORAL_CAPSULE | ORAL | Status: DC

## 2015-04-26 NOTE — Telephone Encounter (Signed)
Pt request refill of the following: tamsulosin (FLOMAX) 0.4 MG CAPS capsule   Phamacy: [place]

## 2015-04-30 ENCOUNTER — Encounter: Payer: Self-pay | Admitting: Family Medicine

## 2015-04-30 ENCOUNTER — Ambulatory Visit (INDEPENDENT_AMBULATORY_CARE_PROVIDER_SITE_OTHER): Payer: 59 | Admitting: Family Medicine

## 2015-04-30 VITALS — BP 120/72 | HR 89 | Temp 98.8°F | Wt 154.0 lb

## 2015-04-30 DIAGNOSIS — M10071 Idiopathic gout, right ankle and foot: Secondary | ICD-10-CM

## 2015-04-30 DIAGNOSIS — M109 Gout, unspecified: Secondary | ICD-10-CM | POA: Insufficient documentation

## 2015-04-30 MED ORDER — INDOMETHACIN 50 MG PO CAPS: 50.0000 mg | ORAL_CAPSULE | Freq: Three times a day (TID) | ORAL | Status: DC

## 2015-04-30 NOTE — Progress Notes (Signed)
Garret Reddish, MD  Subjective:  Preston Pennington is a 64 y.o. year old very pleasant male patient who presents with:  Right MCP pain, redness -Right great MCP pain for about 2 weeks. Red, swollen, severe pain. No falls or injury. Took aleve and helps some. Pain rated as severe especially when area pushed on. Able to walk but painful. Symptoms stable and have not improved so sought care  States had gout at IP joint of R great toe 3-4 years ago when not seen here  ROS- no fever, chills expanding redness  Past Medical History- BPH, ED, DM, HTN  Medications- reviewed and updated Current Outpatient Prescriptions  Medication Sig Dispense Refill  . amLODipine (NORVASC) 10 MG tablet TAKE ONE TABLET BY MOUTH ONCE DAILY *NEEDS  OFFICE  VISIT* 90 tablet 0  . aspirin EC 81 MG tablet Take 81 mg by mouth daily.    [place] glipiZIDE (GLUCOTROL) 5 MG tablet TAKE ONE TABLET BY MOUTH ONCE DAILY 30 tablet 1  . glucose blood (ACCU-CHEK SMARTVIEW) test strip Use as instructed to check blood sugar once a day 100 each 3  . metFORMIN (GLUCOPHAGE) 500 MG tablet TAKE ONE TABLET BY MOUTH TWICE DAILY WITH MEALS 180 tablet 0  . Multiple Vitamins-Minerals (MULTIVITAMIN PO) Take 1 tablet by mouth daily.    . quinapril (ACCUPRIL) 20 MG tablet TAKE ONE TABLET BY MOUTH AT BEDTIME 30 tablet 1  . tamsulosin (FLOMAX) 0.4 MG CAPS capsule TAKE ONE CAPSULE BY MOUTH ONCE DAILY AFTER  SUPPER 90 capsule 1  . neomycin-polymyxin-hydrocortisone (CORTISPORIN) 3.5-10000-1 ophthalmic suspension Place 1 drop into the left eye every 6 (six) hours. (Patient not taking: Reported on 04/30/2015) 7.5 mL 0   Objective: BP 120/72 mmHg  Pulse 89  Temp(Src) 98.8 F (37.1 C)  Wt 154 lb (69.854 kg) Gen: NAD, resting comfortably CV: RRR no murmurs rubs or gallops Lungs: CTAB no crackles, wheeze, rhonchi Abdomen: soft/nontender/nondistended/normal bowel sounds. No rebound or guarding.  Ext: no edema Skin: warm, dry Neuro: grossly normal, moves all  extremities  Assessment/Plan:  Gout-Right MCP pain Suspect gout as cause. No trauma/injury. History of gout though not in our records. Treat indomethacin TID up to 10 days-return if not resolved. Stop at 24 hours after symptoms stop.   Uric acid ordered to be obtained a month after symptoms resolve. Then we will make plan on uric acid lowering agent potentially. Patient sees me to establish within 9 months.   Return precautions advised.   Orders Placed This Encounter  Procedures  . Uric Acid    Standing Status: Future     Number of Occurrences:      Standing Expiration Date: 04/29/2016   Meds ordered this encounter  Medications  . indomethacin (INDOCIN) 50 MG capsule    Sig: Take 1 capsule (50 mg total) by mouth 3 (three) times daily with meals.    Dispense:  30 capsule    Refill:  0

## 2015-04-30 NOTE — Patient Instructions (Addendum)
Take indomethacin (strong antiinflammatory) every day until gout pain gone for 24 hours. Three times a day with meals  If you get through prescription and symptoms persist, come see [place].   There are preventative medicines but need to wait at least a month and need to know your uric acid level. i am ordering a uric acid level today which I want you to go to lab to get drawn a month after symptoms resolve.

## 2015-05-04 ENCOUNTER — Telehealth: Payer: Self-pay | Admitting: Family

## 2015-05-04 MED ORDER — COLCHICINE 0.6 MG PO TABS: ORAL_TABLET | ORAL | Status: DC

## 2015-05-04 NOTE — Telephone Encounter (Signed)
Pt would like tp change the rx indomethacin (INDOCIN) 50 MG capsule prescribed for his gout  Due to it giving him severe indigestion. Pt took at night, and w/ food, but it still gave him indigestion. Would like a different rx called into   [facility]

## 2015-05-04 NOTE — Telephone Encounter (Signed)
A message has already been sent to Dr. Yong Channel regarding this.

## 2015-05-04 NOTE — Telephone Encounter (Signed)
Pt said the following med is giving him indigestion and he is no longer taking it.

## 2015-05-04 NOTE — Telephone Encounter (Signed)
See below

## 2015-05-04 NOTE — Telephone Encounter (Signed)
Sent in colchicine. When he starts this, stop indomethacin

## 2015-05-04 NOTE — Telephone Encounter (Signed)
Lm on pt vm with below info

## 2015-05-05 ENCOUNTER — Telehealth: Payer: Self-pay | Admitting: Family

## 2015-05-05 NOTE — Telephone Encounter (Signed)
Pt states his insurance company will  cover: mitigare  This is for his gout. The other was too expensive, and the first one gave him indigestion  [facility]

## 2015-05-05 NOTE — Telephone Encounter (Signed)
Called pt and Preston Pennington for him to find out what gout medication his insurance will cover and for him to call us back with that information.

## 2015-05-05 NOTE — Telephone Encounter (Signed)
Pt staes this med called in was $194.  Advised to call his insurance co and find out what is covered. Pt verbalized understanding

## 2015-05-05 NOTE — Telephone Encounter (Signed)
Patient Name: Preston Pennington DOB: [birth year] Initial Comment Caller states RX is not covered by insurance Nurse Assessment Guidelines Guideline Title Affirmed Question Affirmed Notes Final Disposition User FINAL ATTEMPT MADE - message left [place], RN, [employer]

## 2015-05-06 ENCOUNTER — Telehealth: Payer: Self-pay | Admitting: Family

## 2015-05-06 MED ORDER — MITIGARE 0.6 MG PO CAPS: ORAL_CAPSULE | ORAL | Status: DC

## 2015-05-06 MED ORDER — COLCHICINE 0.6 MG PO CAPS: ORAL_CAPSULE | ORAL | Status: DC

## 2015-05-06 NOTE — Telephone Encounter (Signed)
Pt call to say that his pharmacy will not pay for the following med  Colchicine (MITIGARE) 0.6 MG CAPS    They will pay for  [place] which is the name brand but not for Colchicine which is the generic.   Pharmacy [facility]

## 2015-05-06 NOTE — Telephone Encounter (Signed)
Is mitigare ok?

## 2015-05-06 NOTE — Telephone Encounter (Signed)
mitigare is fine. Same instructions as the colcrys.

## 2015-05-06 NOTE — Telephone Encounter (Signed)
Rx resent.

## 2015-05-06 NOTE — Addendum Note (Signed)
Addended by: Clyde Lundborg A on: 05/06/2015 10:37 AM   Modules accepted: Orders

## 2015-05-06 NOTE — Telephone Encounter (Signed)
Medication sent in. 

## 2015-06-19 ENCOUNTER — Other Ambulatory Visit: Payer: Self-pay | Admitting: Family

## 2015-06-22 ENCOUNTER — Other Ambulatory Visit: Payer: Self-pay | Admitting: *Deleted

## 2015-06-22 ENCOUNTER — Telehealth: Payer: Self-pay | Admitting: Family

## 2015-06-22 MED ORDER — GLIPIZIDE 5 MG PO TABS: 5.0000 mg | ORAL_TABLET | Freq: Every day | ORAL | Status: DC

## 2015-06-22 NOTE — Telephone Encounter (Signed)
Pt request refill of the following: glipiZIDE (GLUCOTROL) 5 MG tablet   Phamacy: [place]

## 2015-06-22 NOTE — Telephone Encounter (Signed)
Opened in error

## 2015-06-22 NOTE — Telephone Encounter (Signed)
Patient called and made aware.

## 2015-06-22 NOTE — Telephone Encounter (Signed)
Rx sent in

## 2015-06-29 ENCOUNTER — Telehealth: Payer: Self-pay | Admitting: *Deleted

## 2015-06-29 NOTE — Telephone Encounter (Signed)
Patient and family came into office with concerns of feeling faint and nausea. Patient states their fasting CBG at home was 126 and BP 138/70 normally. Patient came into office drinking orange juice. Patient walked with steady gait into procedure room and appeared in no distress. Patient denied chest tightness. Patient is scheduled to see provider tomorrow. CBG checked and was 164 and VS's were 120/80 and HR of 77. Educated patient to drink plenty of water and minimize amount of sugar intake today. Told patient if symptoms worsen or is continuing to not feel well to call office for an acute visit or to go to ED/UC. Patient and family verbalized understanding.

## 2015-06-30 ENCOUNTER — Encounter: Payer: Self-pay | Admitting: Family

## 2015-06-30 ENCOUNTER — Ambulatory Visit (INDEPENDENT_AMBULATORY_CARE_PROVIDER_SITE_OTHER): Payer: 59 | Admitting: Family

## 2015-06-30 VITALS — BP 120/70 | HR 88 | Temp 98.2°F | Wt 152.0 lb

## 2015-06-30 DIAGNOSIS — H8110 Benign paroxysmal vertigo, unspecified ear: Secondary | ICD-10-CM

## 2015-06-30 DIAGNOSIS — E119 Type 2 diabetes mellitus without complications: Secondary | ICD-10-CM | POA: Diagnosis not present

## 2015-06-30 DIAGNOSIS — I10 Essential (primary) hypertension: Secondary | ICD-10-CM

## 2015-06-30 LAB — BASIC METABOLIC PANEL
BUN: 12 mg/dL (ref 6–23)
CALCIUM: 9.4 mg/dL (ref 8.4–10.5)
CO2: 27 mEq/L (ref 19–32)
CREATININE: 0.93 mg/dL (ref 0.40–1.50)
Chloride: 104 mEq/L (ref 96–112)
GFR: 104.89 mL/min (ref >60.00)
Glucose, Bld: 232 mg/dL — ABNORMAL HIGH (ref 70–99)
Potassium: 3.8 mEq/L (ref 3.5–5.1)
Sodium: 139 mEq/L (ref 135–145)

## 2015-06-30 LAB — HEPATIC FUNCTION PANEL
ALT: 57 U/L — AB (ref 0–53)
AST: 36 U/L (ref 0–37)
Albumin: 4 g/dL (ref 3.5–5.2)
Alkaline Phosphatase: 84 U/L (ref 39–117)
BILIRUBIN DIRECT: 0.1 mg/dL (ref 0.0–0.3)
BILIRUBIN TOTAL: 0.4 mg/dL (ref 0.2–1.2)
Total Protein: 7.1 g/dL (ref 6.0–8.3)

## 2015-06-30 LAB — CBC WITH DIFFERENTIAL/PLATELET
BASOS ABS: 0 10*3/uL (ref 0.0–0.1)
Basophils Relative: 0.5 % (ref 0.0–3.0)
EOS ABS: 0.2 10*3/uL (ref 0.0–0.7)
Eosinophils Relative: 5.7 % — ABNORMAL HIGH (ref 0.0–5.0)
HEMATOCRIT: 44.4 % (ref 39.0–52.0)
HEMOGLOBIN: 14.3 g/dL (ref 13.0–17.0)
LYMPHS PCT: 37.5 % (ref 12.0–46.0)
Lymphs Abs: 1.7 10*3/uL (ref 0.7–4.0)
MCHC: 32.3 g/dL (ref 30.0–36.0)
MCV: 81.1 fl (ref 78.0–100.0)
Monocytes Absolute: 0.3 10*3/uL (ref 0.1–1.0)
Monocytes Relative: 6.2 % (ref 3.0–12.0)
NEUTROS ABS: 2.2 10*3/uL (ref 1.4–7.7)
Neutrophils Relative %: 50.1 % (ref 43.0–77.0)
PLATELETS: 121 10*3/uL — AB (ref 150.0–400.0)
RBC: 5.48 Mil/uL (ref 4.22–5.81)
RDW: 13.4 % (ref 11.5–15.5)
WBC: 4.4 10*3/uL (ref 4.0–10.5)

## 2015-06-30 LAB — POCT URINALYSIS DIPSTICK
Bilirubin, UA: NEGATIVE
GLUCOSE UA: NEGATIVE
Ketones, UA: NEGATIVE
Leukocytes, UA: NEGATIVE
NITRITE UA: NEGATIVE
Protein, UA: NEGATIVE
RBC UA: NEGATIVE
Spec Grav, UA: 1.025
UROBILINOGEN UA: 0.2
pH, UA: 5.5

## 2015-06-30 LAB — URIC ACID: Uric Acid, Serum: 6.1 mg/dL (ref 4.0–7.8)

## 2015-06-30 LAB — HEMOGLOBIN A1C: HEMOGLOBIN A1C: 5.6 % (ref 4.6–6.5)

## 2015-06-30 MED ORDER — TAMSULOSIN HCL 0.4 MG PO CAPS: ORAL_CAPSULE | ORAL | Status: DC

## 2015-06-30 MED ORDER — METFORMIN HCL 500 MG PO TABS: 500.0000 mg | ORAL_TABLET | Freq: Two times a day (BID) | ORAL | Status: DC

## 2015-06-30 MED ORDER — GLIPIZIDE 5 MG PO TABS: 5.0000 mg | ORAL_TABLET | Freq: Every day | ORAL | Status: DC

## 2015-06-30 MED ORDER — AMLODIPINE BESYLATE 10 MG PO TABS: ORAL_TABLET | ORAL | Status: DC

## 2015-06-30 MED ORDER — QUINAPRIL HCL 20 MG PO TABS: 20.0000 mg | ORAL_TABLET | Freq: Every day | ORAL | Status: DC

## 2015-06-30 NOTE — Progress Notes (Signed)
Subjective:    Patient ID: Preston Pennington, male    DOB: [birth year], 64 y.o.   MRN: [id number]  HPI  64 year old, African American male, seen today for medication follow-up and for an acute episode of dizziness and nausea.  Patient has a past medical history of hypertension, type 2 diabetes, and BPH.    Patient seen in office on yesterday for dizziness and nausea. He explained waking up yesterday morning feeling dizzy and nausea but denies vomiting, palpitations, headache, or facial numbness.  He experienced episode for most of the morning and came into be seen.  **see notes from encounter on 06/29/15.  He reports symptoms resolved with rest on yesterday.  Today, patient requests a refill on all medication and note of visit for work.  Review of Systems  Constitutional: Negative.   HENT: Negative.   Eyes: Negative.   Respiratory: Negative.   Cardiovascular: Negative.   Gastrointestinal: Negative.   Endocrine: Negative.   Genitourinary: Negative.   Musculoskeletal: Negative.   Skin: Negative.   Allergic/Immunologic: Negative.   Neurological: Negative.   Hematological: Negative.   Psychiatric/Behavioral: Negative.    Past Medical History  Diagnosis Date  . Hypertension   . Diabetes mellitus without complication   . Prostate enlargement 2010    See's Urologist     Social History   Social History  . Marital Status: Married    Spouse Name: N/A  . Number of Children: N/A  . Years of Education: N/A   Occupational History  . Not on file.   Social History Main Topics  . Smoking status: Never Smoker   . Smokeless tobacco: Not on file  . Alcohol Use: No  . Drug Use: No  . Sexual Activity: Not on file   Other Topics Concern  . Not on file   Social History Narrative    Past Surgical History  Procedure Laterality Date  . Leg surgery Right 1997    History reviewed. No pertinent family history.  No Known Allergies  Current Outpatient Prescriptions on File Prior to  Visit  Medication Sig Dispense Refill  . aspirin EC 81 MG tablet Take 81 mg by mouth daily.    Marland Kitchen glucose blood (ACCU-CHEK SMARTVIEW) test strip Use as instructed to check blood sugar once a day 100 each 3  . MITIGARE 0.6 MG CAPS Take 2 pills at first sign of gout then 1 pill two hrs later, then take 1 pill daily. 30 capsule 5  . Multiple Vitamins-Minerals (MULTIVITAMIN PO) Take 1 tablet by mouth daily.    Marland Kitchen neomycin-polymyxin-hydrocortisone (CORTISPORIN) 3.5-10000-1 ophthalmic suspension Place 1 drop into the left eye every 6 (six) hours. 7.5 mL 0   No current facility-administered medications on file prior to visit.    BP 120/70 mmHg  Pulse 88  Temp(Src) 98.2 F (36.8 C) (Oral)  Wt 152 lb (68.947 kg)chart    Objective:   Physical Exam  Constitutional: He is oriented to person, place, and time. He appears well-developed and well-nourished.  HENT:  Head: Normocephalic.  Right Ear: External ear normal.  Left Ear: External ear normal.  Nose: Nose normal.  Mouth/Throat: Oropharynx is clear and moist.  Eyes: Conjunctivae and EOM are normal. Pupils are equal, round, and reactive to light.  Neck: Normal range of motion. Neck supple. No JVD present. No tracheal deviation present. No thyromegaly present.  Cardiovascular: Normal rate, regular rhythm, normal heart sounds and intact distal pulses.   Pulmonary/Chest: Effort normal and breath sounds normal. No  respiratory distress. He has no wheezes. He has no rales. He exhibits no tenderness.  Abdominal: Soft. Bowel sounds are normal. He exhibits no distension and no mass. There is no tenderness. There is no rebound and no guarding.  Musculoskeletal: Normal range of motion. He exhibits no edema or tenderness.  Lymphadenopathy:    He has no cervical adenopathy.  Neurological: He is alert and oriented to person, place, and time. He has normal reflexes. He displays normal reflexes. He exhibits normal muscle tone. Coordination normal.  Skin: Skin  is warm and dry.  Psychiatric: He has a normal mood and affect. His behavior is normal. Judgment and thought content normal.          Assessment & Plan:  Preston Pennington was seen today for follow-up.  Diagnoses and all orders for this visit:  Essential hypertension -     Hemoglobin A1c -     Basic Metabolic Panel -     CBC with Differential -     POC Urinalysis Dipstick -     Hepatic Function Panel -     Uric Acid  Type 2 diabetes mellitus without complication -     Hemoglobin A1c -     Basic Metabolic Panel -     CBC with Differential -     POC Urinalysis Dipstick -     Hepatic Function Panel -     Uric Acid  Benign paroxysmal positional vertigo, unspecified laterality -     Hemoglobin A1c -     Basic Metabolic Panel -     CBC with Differential -     POC Urinalysis Dipstick -     Hepatic Function Panel -     Uric Acid  Other orders -     amLODipine (NORVASC) 10 MG tablet; TAKE ONE TABLET BY MOUTH ONCE DAILY -     glipiZIDE (GLUCOTROL) 5 MG tablet; Take 1 tablet (5 mg total) by mouth daily. -     metFORMIN (GLUCOPHAGE) 500 MG tablet; Take 1 tablet (500 mg total) by mouth 2 (two) times daily with a meal. -     quinapril (ACCUPRIL) 20 MG tablet; Take 1 tablet (20 mg total) by mouth at bedtime. -     tamsulosin (FLOMAX) 0.4 MG CAPS capsule; TAKE ONE CAPSULE BY MOUTH ONCE DAILY AFTER  SUPPER  Call the office with any questions or concerns. Patient has been on a fast and likely explains his dizziness complaint yesterday.

## 2015-06-30 NOTE — Progress Notes (Signed)
Pre visit review using our clinic review tool, if applicable. No additional management support is needed unless otherwise documented below in the visit note. Influenza immunization was not given due to patient declined.

## 2015-06-30 NOTE — Patient Instructions (Signed)
Diabetes and Standards of Medical Care Diabetes is complicated. You may find that your diabetes team includes a dietitian, nurse, diabetes educator, eye doctor, and more. To help everyone know what is going on and to help you get the care you deserve, the following schedule of care was developed to help keep you on track. Below are the tests, exams, vaccines, medicines, education, and plans you will need. HbA1c test This test shows how well you have controlled your glucose over the past 2-3 months. It is used to see if your diabetes management plan needs to be adjusted.   It is performed at least 2 times a year if you are meeting treatment goals.  It is performed 4 times a year if therapy has changed or if you are not meeting treatment goals. Blood pressure test  This test is performed at every routine medical visit. The goal is less than 140/90 mm Hg for most people, but 130/80 mm Hg in some cases. Ask your health care provider about your goal. Dental exam  Follow up with the dentist regularly. Eye exam  If you are diagnosed with type 1 diabetes as a child, get an exam upon reaching the age of 37 years or older and have had diabetes for 3-5 years. Yearly eye exams are recommended after that initial eye exam.  If you are diagnosed with type 1 diabetes as an adult, get an exam within 5 years of diagnosis and then yearly.  If you are diagnosed with type 2 diabetes, get an exam as soon as possible after the diagnosis and then yearly. Foot care exam  Visual foot exams are performed at every routine medical visit. The exams check for cuts, injuries, or other problems with the feet.  A comprehensive foot exam should be done yearly. This includes visual inspection as well as assessing foot pulses and testing for loss of sensation.  Check your feet nightly for cuts, injuries, or other problems with your feet. Tell your health care provider if anything is not healing. Kidney function test (urine  microalbumin)  This test is performed once a year.  Type 1 diabetes: The first test is performed 5 years after diagnosis.  Type 2 diabetes: The first test is performed at the time of diagnosis.  A serum creatinine and estimated glomerular filtration rate (eGFR) test is done once a year to assess the level of chronic kidney disease (CKD), if present. Lipid profile (cholesterol, HDL, LDL, triglycerides)  Performed every 5 years for most people.  The goal for LDL is less than 100 mg/dL. If you are at high risk, the goal is less than 70 mg/dL.  The goal for HDL is 40 mg/dL-50 mg/dL for men and 50 mg/dL-60 mg/dL for women. An HDL cholesterol of 60 mg/dL or higher gives some protection against heart disease.  The goal for triglycerides is less than 150 mg/dL. Influenza vaccine, pneumococcal vaccine, and hepatitis B vaccine  The influenza vaccine is recommended yearly.  It is recommended that people with diabetes who are over 24 years old get the pneumonia vaccine. In some cases, two separate shots may be given. Ask your health care provider if your pneumonia vaccination is up to date.  The hepatitis B vaccine is also recommended for adults with diabetes. Diabetes self-management education  Education is recommended at diagnosis and ongoing as needed. Treatment plan  Your treatment plan is reviewed at every medical visit. Document Released: 08/13/2009 Document Revised: 03/02/2014 Document Reviewed: 03/18/2013 [facility] Patient Information 2015 [place],  [employer]. This information is not intended to replace advice given to you by your health care provider. Make sure you discuss any questions you have with your health care provider.  

## 2015-10-27 ENCOUNTER — Other Ambulatory Visit: Payer: Self-pay | Admitting: Family Medicine

## 2015-11-29 ENCOUNTER — Encounter: Payer: Self-pay | Admitting: Family Medicine

## 2015-11-29 ENCOUNTER — Ambulatory Visit (INDEPENDENT_AMBULATORY_CARE_PROVIDER_SITE_OTHER): Payer: BLUE CROSS/BLUE SHIELD | Admitting: Family Medicine

## 2015-11-29 ENCOUNTER — Other Ambulatory Visit: Payer: Self-pay | Admitting: Family Medicine

## 2015-11-29 VITALS — BP 124/70 | HR 85 | Temp 97.4°F | Wt 155.0 lb

## 2015-11-29 DIAGNOSIS — E119 Type 2 diabetes mellitus without complications: Secondary | ICD-10-CM

## 2015-11-29 DIAGNOSIS — M1A09X0 Idiopathic chronic gout, multiple sites, without tophus (tophi): Secondary | ICD-10-CM | POA: Diagnosis not present

## 2015-11-29 DIAGNOSIS — Z1211 Encounter for screening for malignant neoplasm of colon: Secondary | ICD-10-CM

## 2015-11-29 DIAGNOSIS — Z23 Encounter for immunization: Secondary | ICD-10-CM | POA: Diagnosis not present

## 2015-11-29 DIAGNOSIS — D696 Thrombocytopenia, unspecified: Secondary | ICD-10-CM

## 2015-11-29 DIAGNOSIS — I10 Essential (primary) hypertension: Secondary | ICD-10-CM

## 2015-11-29 LAB — COMPREHENSIVE METABOLIC PANEL
ALK PHOS: 76 U/L (ref 39–117)
ALT: 59 U/L — AB (ref 0–53)
AST: 27 U/L (ref 0–37)
Albumin: 4.3 g/dL (ref 3.5–5.2)
BILIRUBIN TOTAL: 0.5 mg/dL (ref 0.2–1.2)
BUN: 17 mg/dL (ref 6–23)
CALCIUM: 9.5 mg/dL (ref 8.4–10.5)
CO2: 30 meq/L (ref 19–32)
CREATININE: 0.96 mg/dL (ref 0.40–1.50)
Chloride: 102 mEq/L (ref 96–112)
GFR: 100.99 mL/min (ref >60.00)
Glucose, Bld: 73 mg/dL (ref 70–99)
Potassium: 4 mEq/L (ref 3.5–5.1)
Sodium: 140 mEq/L (ref 135–145)
TOTAL PROTEIN: 7.2 g/dL (ref 6.0–8.3)

## 2015-11-29 LAB — CBC WITH DIFFERENTIAL/PLATELET
BASOS ABS: 0 10*3/uL (ref 0.0–0.1)
Basophils Relative: 0.5 % (ref 0.0–3.0)
EOS ABS: 0.2 10*3/uL (ref 0.0–0.7)
Eosinophils Relative: 5.2 % — ABNORMAL HIGH (ref 0.0–5.0)
HEMATOCRIT: 46.8 % (ref 39.0–52.0)
HEMOGLOBIN: 14.8 g/dL (ref 13.0–17.0)
LYMPHS PCT: 36.5 % (ref 12.0–46.0)
Lymphs Abs: 1.7 10*3/uL (ref 0.7–4.0)
MCHC: 31.6 g/dL (ref 30.0–36.0)
MCV: 80.6 fl (ref 78.0–100.0)
MONOS PCT: 7.5 % (ref 3.0–12.0)
Monocytes Absolute: 0.4 10*3/uL (ref 0.1–1.0)
NEUTROS ABS: 2.4 10*3/uL (ref 1.4–7.7)
Neutrophils Relative %: 50.3 % (ref 43.0–77.0)
PLATELETS: 114 10*3/uL — AB (ref 150.0–400.0)
RBC: 5.8 Mil/uL (ref 4.22–5.81)
RDW: 14.4 % (ref 11.5–15.5)
WBC: 4.8 10*3/uL (ref 4.0–10.5)

## 2015-11-29 LAB — HEMOGLOBIN A1C: HEMOGLOBIN A1C: 5.7 % (ref 4.6–6.5)

## 2015-11-29 LAB — URIC ACID: Uric Acid, Serum: 6.1 mg/dL (ref 4.0–7.8)

## 2015-11-29 MED ORDER — ALLOPURINOL 100 MG PO TABS: 100.0000 mg | ORAL_TABLET | Freq: Every day | ORAL | Status: DC

## 2015-11-29 NOTE — Patient Instructions (Addendum)
Before you go.  1. Bevelyn Ngo will give you tetanus shot- good for 10 years  2. Stop by lab 3. Schedule follow up at check out  We will call you within a week about your referral to the eye doctor. If you do not hear within 2 weeks, give [place] a call.   [facility] will call you to schedule colonoscopy.  If you have any low blood sugars, we should stop the glipizide  If your uric acid level is above 6, I am likely to start a medicine to lower it to help prevent gout attacks

## 2015-11-29 NOTE — Assessment & Plan Note (Signed)
S: several months ago seen by me with flare in right foot.. Since that time notes 3-4 episodes of likely gout. Had issues like this in past but has not in year. Colchicine resolves his issues when they arise.  A/P: check uric acid, likely start allopurinol with 2 week colchicine bridge

## 2015-11-29 NOTE — Assessment & Plan Note (Signed)
S: controlled. Amlodipine 10mg , quinapril 20mg  BP Readings from Last 3 Encounters:  11/29/15 124/70  06/30/15 120/70  04/30/15 120/72  A/P:Continue current meds:  Doing well

## 2015-11-29 NOTE — Progress Notes (Signed)
Preston Reddish, Preston Pennington Phone: [phone]  Subjective:  Patient presents today to establish care with me as their new primary care provider. Patient was formerly a patient of Dr. Megan Salon. Chief complaint-noted.   See problem oriented charting- ROS- No chest pain or shortness of breath. No headache or blurry vision. Denies hypoglycemia.   The following were reviewed and entered/updated in epic: Past Medical History  Diagnosis Date  . Hypertension   . Diabetes mellitus without complication ([place])   . Prostate enlargement 2010    See's Urologist    Patient Active Problem List   Diagnosis Date Noted  . Diabetes mellitus type II, controlled ([place]) 04/09/2013    Priority: High  . Thrombocytopenia ([place]) 11/29/2015    Priority: Medium  . Gout 04/30/2015    Priority: Medium  . Essential hypertension 04/09/2013    Priority: Medium  . Erectile dysfunction 02/04/2015    Priority: Low  . BPH (benign prostatic hyperplasia) 04/09/2013    Priority: Low   Past Surgical History  Procedure Laterality Date  . Leg surgery Right 1997    accident related    Family History  Problem Relation Age of Onset  . Other Father     and mother- states died of old age. States no medical problems in parents or siblings    Medications- reviewed and updated Current Outpatient Prescriptions  Medication Sig Dispense Refill  . amLODipine (NORVASC) 10 MG tablet TAKE ONE TABLET BY MOUTH ONCE DAILY 90 tablet 1  . glipiZIDE (GLUCOTROL) 5 MG tablet Take 1 tablet (5 mg total) by mouth daily. 90 tablet 1  . glucose blood (ACCU-CHEK SMARTVIEW) test strip Use as instructed to check blood sugar once a day 100 each 3  . metFORMIN (GLUCOPHAGE) 500 MG tablet Take 1 tablet (500 mg total) by mouth 2 (two) times daily with a meal. 180 tablet 1  . MITIGARE 0.6 MG CAPS Take 2 pills at first sign of gout then 1 pill two hrs later, then take 1 pill daily. 30 capsule 5  . Multiple Vitamins-Minerals (MULTIVITAMIN PO) Take 1  tablet by mouth daily.    . quinapril (ACCUPRIL) 20 MG tablet Take 1 tablet (20 mg total) by mouth at bedtime. 90 tablet 1  Also on BPH meds  Allergies-reviewed and updated No Known Allergies  Social History   Social History  . Marital Status: Married    Spouse Name: N/A  . Number of Children: N/A  . Years of Education: N/A   Social History Main Topics  . Smoking status: Never Smoker   . Smokeless tobacco: None  . Alcohol Use: No  . Drug Use: No  . Sexual Activity: Not Asked   Other Topics Concern  . None   Social History Narrative   Married. 4 children. 5 grandkids- new grandchild 10/2015 included.    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       Hobbies: enjoys reading novels. Enjoys African authors   Goes to [place]    ROS--See HPI   Objective: BP 124/70 mmHg  Pulse 85  Temp(Src) 97.4 F (36.3 C)  Wt 155 lb (70.308 kg) Gen: NAD, resting comfortably HEENT: Mucous membranes are moist. Oropharynx normal CV: RRR no murmurs rubs or gallops Lungs: CTAB no crackles, wheeze, rhonchi Abdomen: soft/nontender/nondistended/normal bowel sounds. No rebound or guarding.  Ext: no edema Skin: warm, dry, no rash Neuro: grossly normal,  moves all extremities, PERRLA  Diabetic Foot Exam - Simple   Simple Foot Form  Diabetic Foot exam was performed with the following findings:  Yes 11/29/2015  9:17 AM  Visual Inspection  No deformities, no ulcerations, no other skin breakdown bilaterally:  Yes  Sensation Testing  Intact to touch and monofilament testing bilaterally:  Yes  Pulse Check  Posterior Tibialis and Dorsalis pulse intact bilaterally:  Yes  Comments     Assessment/Plan:  Diabetes mellitus type II, controlled ([place]) S: well controlled. On Glipizide 5mg  daily, metformin 500mg  BID CBGs- usually 110 or so in morning Lab Results  Component Value Date    HGBA1C 5.6 06/30/2015   HGBA1C 5.2 02/04/2015   HGBA1C 5.2 06/15/2014   A/P: I would advise stopping glipizide- patient is hesitant to do so. He declines any low. We will monitor closely. Would prefer metformin 1 g BID if needed for CBG control before adding sulfornylurea but he is used to regimen so he wants to continue   Gout S: several months ago seen by me with flare in right foot.. Since that time notes 3-4 episodes of likely gout. Had issues like this in past but has not in year. Colchicine resolves his issues when they arise.  A/P: check uric acid, likely start allopurinol with 2 week colchicine bridge   Essential hypertension S: controlled. Amlodipine 10mg , quinapril 20mg  BP Readings from Last 3 Encounters:  11/29/15 124/70  06/30/15 120/70  04/30/15 120/72  A/P:Continue current meds:  Doing well   Thrombocytopenia ([facility]) S: Stable around 120 for last several checks. Other cell lines not affected. No easy bruising/bleeding A/P: continue ot monitor with CBC today- consider further workup if progressive or other cell lines affected.     Return in about 6 months (around 05/28/2016). may see sooner depending on gout.  Return precautions advised.   Orders Placed This Encounter  Procedures  . Hemoglobin A1c    [place]  . CBC with Differential/Platelet  . Comprehensive metabolic panel      . Uric Acid  . Ambulatory referral to Gastroenterology    Referral Priority:  Routine    Referral Type:  Consultation    Referral Reason:  Specialty Services Required    Number of Visits Requested:  1  . Ambulatory referral to Ophthalmology    Referral Priority:  Routine    Referral Type:  Consultation    Referral Reason:  Specialty Services Required    Requested Specialty:  Ophthalmology    Number of Visits Requested:  1  also Tdap to be added- being given at present

## 2015-11-29 NOTE — Addendum Note (Signed)
Addended by: Clyde Lundborg A on: 11/29/2015 09:32 AM   Modules accepted: Orders

## 2015-11-29 NOTE — Assessment & Plan Note (Signed)
S: well controlled. On Glipizide 5mg  daily, metformin 500mg  BID CBGs- usually 110 or so in morning Lab Results  Component Value Date   HGBA1C 5.6 06/30/2015   HGBA1C 5.2 02/04/2015   HGBA1C 5.2 06/15/2014   A/P: I would advise stopping glipizide- patient is hesitant to do so. He declines any low. We will monitor closely. Would prefer metformin 1 g BID if needed for CBG control before adding sulfornylurea but he is used to regimen so he wants to continue

## 2015-11-29 NOTE — Assessment & Plan Note (Signed)
S: Stable around 120 for last several checks. Other cell lines not affected. No easy bruising/bleeding A/P: continue ot monitor with CBC today- consider further workup if progressive or other cell lines affected.

## 2015-11-30 ENCOUNTER — Other Ambulatory Visit: Payer: Self-pay | Admitting: Family Medicine

## 2015-11-30 DIAGNOSIS — M1A09X0 Idiopathic chronic gout, multiple sites, without tophus (tophi): Secondary | ICD-10-CM

## 2015-12-03 ENCOUNTER — Other Ambulatory Visit: Payer: Self-pay

## 2015-12-03 MED ORDER — ALLOPURINOL 100 MG PO TABS
100.0000 mg | ORAL_TABLET | Freq: Every day | ORAL | Status: DC
Start: 2015-12-03 — End: 2016-06-13

## 2015-12-13 ENCOUNTER — Telehealth: Payer: Self-pay | Admitting: Family Medicine

## 2015-12-13 NOTE — Telephone Encounter (Signed)
Prior authorization for MITIGARE 0.6 MG CAPS has been denied by [facility] stating:   Restricted access medication may be covered when two alternate medications ono the member's formulary have been tried and did not work.  In this case there is only one alternative medication that must be tried, Colcrys.

## 2015-12-14 MED ORDER — COLCHICINE 0.6 MG PO TABS: ORAL_TABLET | ORAL | Status: DC

## 2015-12-14 NOTE — Telephone Encounter (Signed)
LM on pt vm tcb  

## 2015-12-14 NOTE — Telephone Encounter (Signed)
Sent in as colcrys. Please inform patient- have him call us back if any issues

## 2015-12-14 NOTE — Telephone Encounter (Signed)
See below

## 2016-01-04 LAB — HM DIABETES EYE EXAM

## 2016-01-06 ENCOUNTER — Encounter: Payer: Self-pay | Admitting: Family Medicine

## 2016-01-07 ENCOUNTER — Telehealth: Payer: Self-pay | Admitting: Family Medicine

## 2016-01-07 MED ORDER — COLCRYS 0.6 MG PO TABS: ORAL_TABLET | ORAL | Status: DC

## 2016-01-07 NOTE — Telephone Encounter (Signed)
Medication sent in. 

## 2016-01-07 NOTE — Telephone Encounter (Signed)
Pt has changed insurance companies to [employer], and pt states they will not pay for MITIGARE 0.6 MG Caps  Pt has a rx for colchicine (COLCRYS) 0.6 MG tablet.   Pt states they will pay for COLCRYS name brand. Pt needs new rx for name brand   sent to [facility] on [address].

## 2016-01-20 ENCOUNTER — Other Ambulatory Visit: Payer: Self-pay | Admitting: Family

## 2016-01-27 ENCOUNTER — Telehealth: Payer: Self-pay | Admitting: Family Medicine

## 2016-01-27 ENCOUNTER — Other Ambulatory Visit: Payer: Self-pay | Admitting: Family

## 2016-01-27 MED ORDER — AMLODIPINE BESYLATE 10 MG PO TABS: ORAL_TABLET | ORAL | Status: DC

## 2016-01-27 NOTE — Telephone Encounter (Signed)
Medication refilled

## 2016-01-27 NOTE — Telephone Encounter (Signed)
Pt need refill on amlodipine 10 mg #30 w/refills send to [facility]

## 2016-02-23 ENCOUNTER — Ambulatory Visit (INDEPENDENT_AMBULATORY_CARE_PROVIDER_SITE_OTHER): Payer: BLUE CROSS/BLUE SHIELD | Admitting: Family Medicine

## 2016-02-23 ENCOUNTER — Encounter: Payer: Self-pay | Admitting: Family Medicine

## 2016-02-23 VITALS — BP 111/69 | HR 88 | Temp 99.4°F | Ht 67.0 in | Wt 153.0 lb

## 2016-02-23 DIAGNOSIS — M546 Pain in thoracic spine: Secondary | ICD-10-CM | POA: Diagnosis not present

## 2016-02-23 LAB — POC URINALSYSI DIPSTICK (AUTOMATED)
Bilirubin, UA: NEGATIVE
Blood, UA: NEGATIVE
Glucose, UA: NEGATIVE
Ketones, UA: NEGATIVE
LEUKOCYTES UA: NEGATIVE
NITRITE UA: NEGATIVE
PH UA: 8
PROTEIN UA: NEGATIVE
Spec Grav, UA: 1.015
Urobilinogen, UA: 0.2

## 2016-02-23 MED ORDER — DICLOFENAC SODIUM 75 MG PO TBEC: 75.0000 mg | DELAYED_RELEASE_TABLET | Freq: Two times a day (BID) | ORAL | Status: DC | PRN

## 2016-02-23 NOTE — Progress Notes (Signed)
Pre visit review using our clinic review tool, if applicable. No additional management support is needed unless otherwise documented below in the visit note. 

## 2016-02-23 NOTE — Addendum Note (Signed)
Addended by: Aggie Hacker A on: 02/23/2016 05:08 PM   Modules accepted: Orders

## 2016-02-23 NOTE — Progress Notes (Signed)
   Subjective:    Patient ID: Preston Pennington, male    DOB: Apr 22, 1951, 65 y.o.   MRN: [id number]  HPI Here for 5 days of intermittent sharp pain in the right middle back. No recent trauma. He notes it hurts the worst when he lies down in bed. No cough or SOB. No fever or nausea. No change in appetite. No bowel or bladder changes. Tylenol helps it for a short while.    Review of Systems  Constitutional: Negative.   Respiratory: Negative.   Cardiovascular: Negative.   Gastrointestinal: Negative.   Genitourinary: Negative.   Musculoskeletal: Positive for back pain.  Neurological: Negative.        Objective:   Physical Exam  Constitutional: He is oriented to person, place, and time. He appears well-developed and well-nourished. No distress.  Neck: No thyromegaly present.  Cardiovascular: Normal rate, regular rhythm, normal heart sounds and intact distal pulses.   Pulmonary/Chest: Effort normal and breath sounds normal.  Abdominal: Soft. Bowel sounds are normal. He exhibits no distension and no mass. There is no tenderness. There is no rebound and no guarding.  Musculoskeletal:  He is tender in the right paraspinal muscles. No spasm. He has pain with trunk rotation  Lymphadenopathy:    He has no cervical adenopathy.  Neurological: He is alert and oriented to person, place, and time.          Assessment & Plan:  This is consistent with a muscular strain. Use heat and try Diclofenac prn pain. Recheck prn. Laurey Morale, MD

## 2016-02-24 ENCOUNTER — Other Ambulatory Visit: Payer: Self-pay

## 2016-02-24 MED ORDER — GLUCOSE BLOOD VI STRP: ORAL_STRIP | Status: DC

## 2016-04-30 ENCOUNTER — Other Ambulatory Visit: Payer: Self-pay | Admitting: Family Medicine

## 2016-05-15 ENCOUNTER — Other Ambulatory Visit: Payer: Self-pay | Admitting: Family

## 2016-05-16 ENCOUNTER — Other Ambulatory Visit: Payer: Self-pay | Admitting: Emergency Medicine

## 2016-05-16 MED ORDER — QUINAPRIL HCL 20 MG PO TABS: 20.0000 mg | ORAL_TABLET | Freq: Every day | ORAL | Status: DC

## 2016-05-29 ENCOUNTER — Ambulatory Visit: Payer: Self-pay | Admitting: Family Medicine

## 2016-06-05 ENCOUNTER — Ambulatory Visit: Payer: Self-pay | Admitting: Family Medicine

## 2016-06-13 ENCOUNTER — Encounter: Payer: Self-pay | Admitting: Family Medicine

## 2016-06-13 ENCOUNTER — Ambulatory Visit (INDEPENDENT_AMBULATORY_CARE_PROVIDER_SITE_OTHER): Payer: BLUE CROSS/BLUE SHIELD | Admitting: Family Medicine

## 2016-06-13 VITALS — BP 122/78 | HR 97 | Temp 98.5°F | Wt 157.8 lb

## 2016-06-13 DIAGNOSIS — I10 Essential (primary) hypertension: Secondary | ICD-10-CM

## 2016-06-13 DIAGNOSIS — G8929 Other chronic pain: Secondary | ICD-10-CM

## 2016-06-13 DIAGNOSIS — D696 Thrombocytopenia, unspecified: Secondary | ICD-10-CM

## 2016-06-13 DIAGNOSIS — M1A09X0 Idiopathic chronic gout, multiple sites, without tophus (tophi): Secondary | ICD-10-CM

## 2016-06-13 DIAGNOSIS — N400 Enlarged prostate without lower urinary tract symptoms: Secondary | ICD-10-CM

## 2016-06-13 DIAGNOSIS — M25571 Pain in right ankle and joints of right foot: Secondary | ICD-10-CM

## 2016-06-13 DIAGNOSIS — E119 Type 2 diabetes mellitus without complications: Secondary | ICD-10-CM

## 2016-06-13 DIAGNOSIS — Z1211 Encounter for screening for malignant neoplasm of colon: Secondary | ICD-10-CM

## 2016-06-13 MED ORDER — DICLOFENAC SODIUM 75 MG PO TBEC: 75.0000 mg | DELAYED_RELEASE_TABLET | Freq: Two times a day (BID) | ORAL | 0 refills | Status: DC | PRN

## 2016-06-13 NOTE — Patient Instructions (Signed)
Refilled diclofenac- you are aware or risks to heart and kidney- the shorter term the better  Labs before you leave  We will call you within a week about your referral to colonoscopy. If you do not hear within 2 weeks, give [place] a call.

## 2016-06-13 NOTE — Progress Notes (Signed)
Subjective:  Preston Pennington is a 65 y.o. year old very pleasant male patient who presents for/with See problem oriented charting ROS- No chest pain or shortness of breath. No headache or blurry vision. Admits to intermittent hot swollen joints.see any ROS included in HPI as well.   Past Medical History-  Patient Active Problem List   Diagnosis Date Noted  . Diabetes mellitus type II, controlled ([place]) 04/09/2013    Priority: High  . Chronic ankle pain 06/14/2016    Priority: Medium  . Thrombocytopenia ([place]) 11/29/2015    Priority: Medium  . Gout 04/30/2015    Priority: Medium  . Essential hypertension 04/09/2013    Priority: Medium  . Erectile dysfunction 02/04/2015    Priority: Low  . BPH (benign prostatic hyperplasia) 04/09/2013    Priority: Low    Medications- reviewed and updated Current Outpatient Prescriptions  Medication Sig Dispense Refill  . allopurinol (ZYLOPRIM) 100 MG tablet Take 1 tablet (100 mg total) by mouth daily. 90 tablet 3  . amLODipine (NORVASC) 10 MG tablet Take 1 tablet (10 mg total) by mouth daily. 90 tablet 3  . COLCRYS 0.6 MG tablet Take 2 pills at first sign of gout then 1 pill two hrs later, then take 1 pill daily until flare resolved 30 tablet 5  . diclofenac (VOLTAREN) 75 MG EC tablet Take 1 tablet (75 mg total) by mouth 2 (two) times daily as needed for moderate pain. 60 tablet 0  . finasteride (PROSCAR) 5 MG tablet Take 5 mg by mouth daily.    Marland Kitchen glipiZIDE (GLUCOTROL) 5 MG tablet Take 1 tablet (5 mg total) by mouth daily before breakfast. 90 tablet 3  . glucose blood (ACCU-CHEK SMARTVIEW) test strip Use as instructed to check blood sugar once a day Dx: E11.9 100 each 3  . metFORMIN (GLUCOPHAGE) 500 MG tablet Take 1 tablet (500 mg total) by mouth 2 (two) times daily with a meal. 180 tablet 3  . Multiple Vitamins-Minerals (MULTIVITAMIN PO) Take 1 tablet by mouth daily.    . quinapril (ACCUPRIL) 20 MG tablet Take 1 tablet (20 mg total) by mouth at bedtime. 90  tablet 1   No current facility-administered medications for this visit.     Objective: BP 122/78 (BP Location: Left Arm, Patient Position: Sitting, Cuff Size: Normal)   Pulse 97   Temp 98.5 F (36.9 C) (Oral)   Wt 157 lb 12.8 oz (71.6 kg)   SpO2 97%   BMI 24.71 kg/m  Gen: NAD, resting comfortably MSK: scarring noted on right foot/ankle- very limited mobility  Assessment/Plan:   Diabetes mellitus type II, controlled ([name]) S: well controlled. On glipizide 5mg  daily, metformin 500mg  BID CBGs- mostly 110. No lows Lab Results  Component Value Date   HGBA1C 5.7 11/29/2015   HGBA1C 5.6 06/30/2015   HGBA1C 5.2 02/04/2015   A/P: had discussed in past stopping glipizide if any lowsbut he has not had any so prefers to continue. Suspect remains controlled- update a1c today  Thrombocytopenia ([facility]) S: Stable around 100-150 range, no other cell line deficits A/P: continue to monitor with CBC today   Gout S: uric acid 6.1 and was having gout flares so started allopurinol 100mg . Once a month attacks now down to every 2-3 months. Colchicine helps A/P: with uric acid level being likely below 6- I wonder if each attack patient mentions is true gout flare. Regardless will update a1c- I would be hesitant to increase allopurinol unless uric acid elevated above 6  Essential hypertension  S: controlled on amlodipine 10mg , quinapril 20mg  BP Readings from Last 3 Encounters:  06/13/16 122/78  02/23/16 111/69  11/29/15 124/70  A/P:Continue current medications- excellent control  BPH (benign prostatic hyperplasia) Lab Results  Component Value Date   PSA 12.90 (H) 06/15/2014  biopsy 2 weeks ago negative for cancer   Chronic ankle pain Chronic ankle pain right ankle- easy to injure. Fixed after prior fracture- wears raised heel in that shoe- asks for diclofenac 06/14/16 for when he tweaks it- educated on risks and he would like to take despite it when considering quality of life  6  months  Orders Placed This Encounter  Procedures  . Uric Acid  . CBC    [place]  . Comprehensive metabolic panel    [place]  . LDL cholesterol, direct    [place]  . Hemoglobin A1c    [place]  . Ambulatory referral to Gastroenterology    Referral Priority:   Routine    Referral Type:   Consultation    Referral Reason:   Specialty Services Required    Number of Visits Requested:   1    Meds ordered this encounter  Medications  . finasteride (PROSCAR) 5 MG tablet    Sig: Take 5 mg by mouth daily.  . diclofenac (VOLTAREN) 75 MG EC tablet    Sig: Take 1 tablet (75 mg total) by mouth 2 (two) times daily as needed for moderate pain.    Dispense:  60 tablet    Refill:  0    Return precautions advised.  Garret Reddish, MD

## 2016-06-13 NOTE — Progress Notes (Signed)
Pre visit review using our clinic review tool, if applicable. No additional management support is needed unless otherwise documented below in the visit note. 

## 2016-06-14 DIAGNOSIS — G8929 Other chronic pain: Secondary | ICD-10-CM | POA: Insufficient documentation

## 2016-06-14 DIAGNOSIS — M25579 Pain in unspecified ankle and joints of unspecified foot: Secondary | ICD-10-CM

## 2016-06-14 LAB — COMPREHENSIVE METABOLIC PANEL
ALT: 55 U/L — ABNORMAL HIGH (ref 0–53)
AST: 28 U/L (ref 0–37)
Albumin: 4.2 g/dL (ref 3.5–5.2)
Alkaline Phosphatase: 82 U/L (ref 39–117)
BUN: 16 mg/dL (ref 6–23)
CALCIUM: 9.6 mg/dL (ref 8.4–10.5)
CHLORIDE: 106 meq/L (ref 96–112)
CO2: 26 mEq/L (ref 19–32)
Creatinine, Ser: 0.97 mg/dL (ref 0.40–1.50)
GFR: 99.62 mL/min (ref >60.00)
Glucose, Bld: 192 mg/dL — ABNORMAL HIGH (ref 70–99)
Potassium: 4.1 mEq/L (ref 3.5–5.1)
SODIUM: 140 meq/L (ref 135–145)
Total Bilirubin: 0.3 mg/dL (ref 0.2–1.2)
Total Protein: 6.7 g/dL (ref 6.0–8.3)

## 2016-06-14 LAB — CBC
HCT: 41 % (ref 39.0–52.0)
Hemoglobin: 13.5 g/dL (ref 13.0–17.0)
MCHC: 33 g/dL (ref 30.0–36.0)
MCV: 79.7 fl (ref 78.0–100.0)
Platelets: 122 10*3/uL — ABNORMAL LOW (ref 150.0–400.0)
RBC: 5.15 Mil/uL (ref 4.22–5.81)
RDW: 13.7 % (ref 11.5–15.5)
WBC: 4.4 10*3/uL (ref 4.0–10.5)

## 2016-06-14 LAB — HEMOGLOBIN A1C: Hgb A1c MFr Bld: 5.6 % (ref 4.6–6.5)

## 2016-06-14 LAB — URIC ACID: URIC ACID, SERUM: 5.6 mg/dL (ref 4.0–7.8)

## 2016-06-14 LAB — LDL CHOLESTEROL, DIRECT: LDL DIRECT: 89 mg/dL

## 2016-06-14 MED ORDER — METFORMIN HCL 500 MG PO TABS: 500.0000 mg | ORAL_TABLET | Freq: Two times a day (BID) | ORAL | 3 refills | Status: DC

## 2016-06-14 MED ORDER — AMLODIPINE BESYLATE 10 MG PO TABS: 10.0000 mg | ORAL_TABLET | Freq: Every day | ORAL | 3 refills | Status: DC

## 2016-06-14 MED ORDER — QUINAPRIL HCL 20 MG PO TABS: 20.0000 mg | ORAL_TABLET | Freq: Every day | ORAL | 1 refills | Status: DC

## 2016-06-14 MED ORDER — ALLOPURINOL 100 MG PO TABS: 100.0000 mg | ORAL_TABLET | Freq: Every day | ORAL | 3 refills | Status: DC

## 2016-06-14 MED ORDER — GLIPIZIDE 5 MG PO TABS: 5.0000 mg | ORAL_TABLET | Freq: Every day | ORAL | 3 refills | Status: DC

## 2016-06-14 NOTE — Assessment & Plan Note (Signed)
S: well controlled. On glipizide 5mg  daily, metformin 500mg  BID CBGs- mostly 110. No lows Lab Results  Component Value Date   HGBA1C 5.7 11/29/2015   HGBA1C 5.6 06/30/2015   HGBA1C 5.2 02/04/2015   A/P: had discussed in past stopping glipizide if any lowsbut he has not had any so prefers to continue. Suspect remains controlled- update a1c today

## 2016-06-14 NOTE — Assessment & Plan Note (Signed)
Chronic ankle pain right ankle- easy to injure. Fixed after prior fracture- wears raised heel in that shoe- asks for diclofenac 06/14/16 for when he tweaks it- educated on risks and he would like to take despite it when considering quality of life

## 2016-06-14 NOTE — Assessment & Plan Note (Signed)
S: uric acid 6.1 and was having gout flares so started allopurinol 100mg . Once a month attacks now down to every 2-3 months. Colchicine helps A/P: with uric acid level being likely below 6- I wonder if each attack patient mentions is true gout flare. Regardless will update a1c- I would be hesitant to increase allopurinol unless uric acid elevated above 6

## 2016-06-14 NOTE — Assessment & Plan Note (Signed)
Lab Results  Component Value Date   PSA 12.90 (H) 06/15/2014  biopsy 2 weeks ago negative for cancer

## 2016-06-14 NOTE — Assessment & Plan Note (Signed)
S: controlled on amlodipine 10mg , quinapril 20mg  BP Readings from Last 3 Encounters:  06/13/16 122/78  02/23/16 111/69  11/29/15 124/70  A/P:Continue current medications- excellent control

## 2016-06-14 NOTE — Assessment & Plan Note (Signed)
S: Stable around 100-150 range, no other cell line deficits A/P: continue to monitor with CBC today

## 2016-07-02 ENCOUNTER — Other Ambulatory Visit: Payer: Self-pay | Admitting: Family Medicine

## 2016-07-17 ENCOUNTER — Encounter: Payer: Self-pay | Admitting: Family Medicine

## 2016-08-01 ENCOUNTER — Encounter: Payer: Self-pay | Admitting: Internal Medicine

## 2016-09-20 ENCOUNTER — Telehealth: Payer: Self-pay

## 2016-09-20 ENCOUNTER — Ambulatory Visit (AMBULATORY_SURGERY_CENTER): Payer: Self-pay

## 2016-09-20 VITALS — Ht 67.0 in | Wt 159.6 lb

## 2016-09-20 DIAGNOSIS — Z1211 Encounter for screening for malignant neoplasm of colon: Secondary | ICD-10-CM

## 2016-09-20 NOTE — Progress Notes (Signed)
No allergies to eggs or soy No diet meds No home oxygen No past problems with anesthesia BUT HAS SMALL MOUTH AND "THEY HAD A VERY VERY DIFFICULT TIME INTUBATING ME IN [place]"  Registered for [place]

## 2016-09-20 NOTE — Telephone Encounter (Signed)
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  Do you want him to be a direct [facility] case or would you like to see him in the office first?                                                               Thank you,                                                                 Angela/PV

## 2016-09-20 NOTE — Telephone Encounter (Signed)
Direct hospital  Ok to use a slot week of 1/2 but try to avoid 1/2 itself

## 2016-09-25 ENCOUNTER — Telehealth: Payer: Self-pay

## 2016-09-25 ENCOUNTER — Other Ambulatory Visit: Payer: Self-pay

## 2016-09-25 DIAGNOSIS — Z1211 Encounter for screening for malignant neoplasm of colon: Secondary | ICD-10-CM

## 2016-09-25 NOTE — Telephone Encounter (Signed)
Patient notified of the need to change appt to [facility] for 11/02/16 10:00.  We reviewed new instructions via telephone.  He verbalized understanding of new instructions.

## 2016-09-25 NOTE — Telephone Encounter (Signed)
-----   Message from Greggory Keen, LPN sent at [phone] 11:35 AM EST ----- Levada Dy said this patient needs to be scheduled at the hospital. Difficult airway. Dr Carlean Purl is notified per Levada Dy.

## 2016-10-06 ENCOUNTER — Encounter: Payer: Self-pay | Admitting: Internal Medicine

## 2016-10-13 ENCOUNTER — Telehealth: Payer: Self-pay | Admitting: Family Medicine

## 2016-10-13 NOTE — Telephone Encounter (Signed)
Pt needs new glucometer accu chek nano smartview with testing strips send to [facility] [address]

## 2016-10-16 ENCOUNTER — Other Ambulatory Visit: Payer: Self-pay

## 2016-10-16 DIAGNOSIS — E119 Type 2 diabetes mellitus without complications: Secondary | ICD-10-CM

## 2016-10-16 MED ORDER — GLUCOSE BLOOD VI STRP: ORAL_STRIP | 3 refills | Status: DC

## 2016-10-16 NOTE — Telephone Encounter (Signed)
Sent to pharmacy as requested 

## 2016-10-26 ENCOUNTER — Encounter (HOSPITAL_COMMUNITY): Payer: Self-pay

## 2016-10-27 ENCOUNTER — Encounter: Payer: Self-pay | Admitting: Internal Medicine

## 2016-10-31 ENCOUNTER — Telehealth: Payer: Self-pay | Admitting: Internal Medicine

## 2016-10-31 MED ORDER — NA SULFATE-K SULFATE-MG SULF 17.5-3.13-1.6 GM/177ML PO SOLN
ORAL | 0 refills | Status: DC
Start: 2016-10-31 — End: 2016-11-02

## 2016-10-31 NOTE — Telephone Encounter (Signed)
Prep instructions left up front for pt to pick up. Pt aware.

## 2016-10-31 NOTE — Telephone Encounter (Signed)
Patient states that he is needing another prep called in. He says that procedure is on Thursday

## 2016-10-31 NOTE — Telephone Encounter (Signed)
Put suprep sample up front for pick up and patient informed.

## 2016-10-31 NOTE — Telephone Encounter (Signed)
Left message that suprep sent in to the [facility] as requested.

## 2016-11-02 ENCOUNTER — Encounter (HOSPITAL_COMMUNITY)
Admission: RE | Disposition: A | Discharge status: Home / Self Care | Payer: Self-pay | Source: Ambulatory Visit | Attending: Internal Medicine

## 2016-11-02 ENCOUNTER — Encounter (HOSPITAL_COMMUNITY): Payer: Self-pay | Admitting: *Deleted

## 2016-11-02 ENCOUNTER — Ambulatory Visit (HOSPITAL_COMMUNITY): Payer: BLUE CROSS/BLUE SHIELD | Admitting: Anesthesiology

## 2016-11-02 ENCOUNTER — Ambulatory Visit (HOSPITAL_COMMUNITY)
Admission: RE | Admit: 2016-11-02 | Discharge: 2016-11-02 | Disposition: A | Discharge status: Home / Self Care | Payer: BLUE CROSS/BLUE SHIELD | Source: Ambulatory Visit | Attending: Internal Medicine | Admitting: Internal Medicine

## 2016-11-02 DIAGNOSIS — D124 Benign neoplasm of descending colon: Secondary | ICD-10-CM | POA: Diagnosis not present

## 2016-11-02 DIAGNOSIS — E119 Type 2 diabetes mellitus without complications: Secondary | ICD-10-CM | POA: Insufficient documentation

## 2016-11-02 DIAGNOSIS — Z1211 Encounter for screening for malignant neoplasm of colon: Secondary | ICD-10-CM

## 2016-11-02 DIAGNOSIS — Z1212 Encounter for screening for malignant neoplasm of rectum: Secondary | ICD-10-CM | POA: Diagnosis not present

## 2016-11-02 DIAGNOSIS — Z7984 Long term (current) use of oral hypoglycemic drugs: Secondary | ICD-10-CM | POA: Insufficient documentation

## 2016-11-02 DIAGNOSIS — I10 Essential (primary) hypertension: Secondary | ICD-10-CM | POA: Insufficient documentation

## 2016-11-02 DIAGNOSIS — D120 Benign neoplasm of cecum: Secondary | ICD-10-CM | POA: Insufficient documentation

## 2016-11-02 DIAGNOSIS — D122 Benign neoplasm of ascending colon: Secondary | ICD-10-CM | POA: Insufficient documentation

## 2016-11-02 HISTORY — PX: COLONOSCOPY WITH PROPOFOL: SHX5780

## 2016-11-02 HISTORY — DX: Failed or difficult intubation, initial encounter: T88.4XXA

## 2016-11-02 LAB — GLUCOSE, CAPILLARY: Glucose-Capillary: 94 mg/dL (ref 65–99)

## 2016-11-02 SURGERY — COLONOSCOPY WITH PROPOFOL
Anesthesia: Monitor Anesthesia Care

## 2016-11-02 MED ORDER — PROPOFOL 10 MG/ML IV BOLUS
INTRAVENOUS | Status: AC
  Filled 2016-11-02: qty 40

## 2016-11-02 MED ORDER — LACTATED RINGERS IV SOLN
INTRAVENOUS | Status: DC
  Administered 2016-11-02: 09:00:00 via INTRAVENOUS

## 2016-11-02 MED ORDER — SODIUM CHLORIDE 0.9 % IV SOLN: INTRAVENOUS | Status: DC

## 2016-11-02 MED ORDER — PROPOFOL 10 MG/ML IV BOLUS
INTRAVENOUS | Status: DC | PRN
  Administered 2016-11-02 (×3): 20 mg via INTRAVENOUS
  Administered 2016-11-02: 10 mg via INTRAVENOUS
  Administered 2016-11-02: 20 mg via INTRAVENOUS
  Administered 2016-11-02 (×2): 10 mg via INTRAVENOUS
  Administered 2016-11-02 (×4): 20 mg via INTRAVENOUS
  Administered 2016-11-02 (×3): 10 mg via INTRAVENOUS

## 2016-11-02 SURGICAL SUPPLY — 21 items

## 2016-11-02 NOTE — Anesthesia Preprocedure Evaluation (Addendum)
Anesthesia Evaluation  Patient identified by MRN, date of birth, ID band Patient awake    Reviewed: Allergy & Precautions, H&P , Patient's Chart, lab work & pertinent test results, reviewed documented beta blocker date and time   History of Anesthesia Complications (+) DIFFICULT AIRWAY and history of anesthetic complications  Airway Mallampati: II  TM Distance: >3 FB Neck ROM: full    Dental no notable dental hx.    Pulmonary    Pulmonary exam normal breath sounds clear to auscultation       Cardiovascular hypertension,  Rhythm:regular Rate:Normal     Neuro/Psych    GI/Hepatic   Endo/Other  diabetes  Renal/GU      Musculoskeletal   Abdominal   Peds  Hematology   Anesthesia Other Findings   Reproductive/Obstetrics                             Anesthesia Physical Anesthesia Plan  ASA: II  Anesthesia Plan: MAC   Post-op Pain Management:    Induction: Intravenous  Airway Management Planned: Mask and Natural Airway  Additional Equipment:   Intra-op Plan:   Post-operative Plan:   Informed Consent: I have reviewed the patients History and Physical, chart, labs and discussed the procedure including the risks, benefits and alternatives for the proposed anesthesia with the patient or authorized representative who has indicated his/her understanding and acceptance.   Dental Advisory Given  Plan Discussed with: CRNA and Surgeon  Anesthesia Plan Comments: (Discussed sedation and potential to need to place airway or ETT if warranted by clinical changes intra-operatively. We will start procedure as MAC.)        Anesthesia Quick Evaluation

## 2016-11-02 NOTE — Discharge Instructions (Signed)
YOU HAD AN ENDOSCOPIC PROCEDURE TODAY: Refer to the procedure report and other information in the discharge instructions given to you for any specific questions about what was found during the examination. If this information does not answer your questions, please call [place] office at [phone] to clarify.   YOU SHOULD EXPECT: Some feelings of bloating in the abdomen. Passage of more gas than usual. Walking can help get rid of the air that was put into your GI tract during the procedure and reduce the bloating. If you had a lower endoscopy (such as a colonoscopy or flexible sigmoidoscopy) you may notice spotting of blood in your stool or on the toilet paper. Some abdominal soreness may be present for a day or two, also.  DIET: Your first meal following the procedure should be a light meal and then it is ok to progress to your normal diet. A half-sandwich or bowl of soup is an example of a good first meal. Heavy or fried foods are harder to digest and may make you feel nauseous or bloated. Drink plenty of fluids but you should avoid alcoholic beverages for 24 hours. If you had a esophageal dilation, please see attached instructions for diet.    ACTIVITY: Your care partner should take you home directly after the procedure. You should plan to take it easy, moving slowly for the rest of the day. You can resume normal activity the day after the procedure however YOU SHOULD NOT DRIVE, use power tools, machinery or perform tasks that involve climbing or major physical exertion for 24 hours (because of the sedation medicines used during the test).   SYMPTOMS TO REPORT IMMEDIATELY: A gastroenterologist can be reached at any hour. Please call [phone]  for any of the following symptoms:  Following lower endoscopy (colonoscopy, flexible sigmoidoscopy) Excessive amounts of blood in the stool  Significant tenderness, worsening of abdominal pains  Swelling of the abdomen that is new, acute  Fever of 100 or  higher  Following upper endoscopy (EGD, EUS, ERCP, esophageal dilation) Vomiting of blood or coffee ground material  New, significant abdominal pain  New, significant chest pain or pain under the shoulder blades  Painful or persistently difficult swallowing  New shortness of breath  Black, tarry-looking or red, bloody stools  FOLLOW UP:  If any biopsies were taken you will be contacted by phone or by letter within the next 1-3 weeks. Call [phone]  if you have not heard about the biopsies in 3 weeks.  Please also call with any specific questions about appointments or follow up tests.YOU HAD AN ENDOSCOPIC PROCEDURE TODAY: Refer to the procedure report and other information in the discharge instructions given to you for any specific questions about what was found during the examination. If this information does not answer your questions, please call [place] office at [phone] to clarify.   YOU SHOULD EXPECT: Some feelings of bloating in the abdomen. Passage of more gas than usual. Walking can help get rid of the air that was put into your GI tract during the procedure and reduce the bloating. If you had a lower endoscopy (such as a colonoscopy or flexible sigmoidoscopy) you may notice spotting of blood in your stool or on the toilet paper. Some abdominal soreness may be present for a day or two, also.  DIET: Your first meal following the procedure should be a light meal and then it is ok to progress to your normal diet. A half-sandwich or bowl of soup is an example of a   good first meal. Heavy or fried foods are harder to digest and may make you feel nauseous or bloated. Drink plenty of fluids but you should avoid alcoholic beverages for 24 hours. If you had a esophageal dilation, please see attached instructions for diet.    ACTIVITY: Your care partner should take you home directly after the procedure. You should plan to take it easy, moving slowly for the rest of the day. You can resume  normal activity the day after the procedure however YOU SHOULD NOT DRIVE, use power tools, machinery or perform tasks that involve climbing or major physical exertion for 24 hours (because of the sedation medicines used during the test).   SYMPTOMS TO REPORT IMMEDIATELY: A gastroenterologist can be reached at any hour. Please call [phone]  for any of the following symptoms:  Following lower endoscopy (colonoscopy, flexible sigmoidoscopy) Excessive amounts of blood in the stool  Significant tenderness, worsening of abdominal pains  Swelling of the abdomen that is new, acute  Fever of 100 or higher  FOLLOW UP:  If any biopsies were taken you will be contacted by phone or by letter within the next 1-3 weeks. Call [phone]  if you have not heard about the biopsies in 3 weeks.  Please also call with any specific questions about appointments or follow up tests. 

## 2016-11-02 NOTE — Transfer of Care (Signed)
Immediate Anesthesia Transfer of Care Note  Patient: Preston Pennington  Procedure(s) Performed: Procedure(s): COLONOSCOPY WITH PROPOFOL (N/A)  Patient Location: PACU  Anesthesia Type:MAC  Level of Consciousness: sedated  Airway & Oxygen Therapy: Patient Spontanous Breathing and Patient connected to nasal cannula oxygen  Post-op Assessment: Report given to RN and Post -op Vital signs reviewed and stable  Post vital signs: Reviewed and stable  Last Vitals:  Vitals:   11/02/16 0900  BP: 124/81  Pulse: 75  Resp: 14  Temp: 36.7 C    Last Pain:  Vitals:   11/02/16 0900  TempSrc: Oral         Complications: No apparent anesthesia complications

## 2016-11-02 NOTE — Anesthesia Postprocedure Evaluation (Signed)
Anesthesia Post Note  Patient: Preston Pennington  Procedure(s) Performed: Procedure(s) (LRB): COLONOSCOPY WITH PROPOFOL (N/A)  Patient location during evaluation: PACU Anesthesia Type: MAC Level of consciousness: awake and alert Pain management: pain level controlled Vital Signs Assessment: post-procedure vital signs reviewed and stable Respiratory status: spontaneous breathing, nonlabored ventilation, respiratory function stable and patient connected to nasal cannula oxygen Cardiovascular status: stable and blood pressure returned to baseline Anesthetic complications: no        Last Vitals:  Vitals:   11/02/16 0900 11/02/16 1004  BP: 124/81 (!) 87/55  Pulse: 75 63  Resp: 14 12  Temp: 36.7 C (!) 36 C    Last Pain:  Vitals:   11/02/16 1004  TempSrc: Axillary   Pain Goal:                 , [place]

## 2016-11-02 NOTE — Op Note (Signed)
[facility] Patient Name: Preston Pennington Procedure Date: 11/02/2016 MRN: [id number] Attending MD: Gatha Mayer , MD Date of Birth: 12-Apr-1951 CSN: [id number] Age: 66 Admit Type: Outpatient Procedure:                Colonoscopy Indications:              Screening for colorectal malignant neoplasm, This                            is the patient's first colonoscopy Providers:                Gatha Mayer, MD, Elmer Ramp. Tilden Dome, RN, [address] Technician, Verdi Alday CRNA, CRNA Referring MD:              Medicines:                Propofol per Anesthesia, Monitored Anesthesia Care Complications:            No immediate complications. Estimated Blood Loss:     Estimated blood loss was minimal. Procedure:                Pre-Anesthesia Assessment:                           - Prior to the procedure, a History and Physical                            was performed, and patient medications and                            allergies were reviewed. The patient's tolerance of                            previous anesthesia was also reviewed. The risks                            and benefits of the procedure and the sedation                            options and risks were discussed with the patient.                            All questions were answered, and informed consent                            was obtained. Prior Anticoagulants: The patient has                            taken no previous anticoagulant or antiplatelet                            agents. ASA Grade Assessment: II - A patient with  mild systemic disease. After reviewing the risks                            and benefits, the patient was deemed in                            satisfactory condition to undergo the procedure.                           After obtaining informed consent, the colonoscope                            was passed under direct vision.  Throughout the                            procedure, the patient's blood pressure, pulse, and                            oxygen saturations were monitored continuously. The                            [id number] scope was introduced through                            the anus and advanced to the the cecum, identified                            by appendiceal orifice and ileocecal valve. The                            colonoscopy was performed without difficulty. The                            patient tolerated the procedure well. The quality                            of the bowel preparation was excellent. The bowel                            preparation used was SUPREP. The ileocecal valve,                            appendiceal orifice, and rectum were photographed. Scope In: 9:46:51 AM Scope Out: 10:01:50 AM Scope Withdrawal Time: 0 hours 11 minutes 35 seconds  Total Procedure Duration: 0 hours 14 minutes 59 seconds  Findings:      The perianal and digital rectal examinations were normal. Pertinent       negatives include normal prostate (size, shape, and consistency).      Three sessile polyps were found in the ascending colon and cecum. The       polyps were diminutive in size. These polyps were removed with a cold       snare. Resection and retrieval were complete. Verification of patient       identification for the specimen was done. Estimated  blood loss was       minimal.      A diminutive polyp was found in the descending colon. The polyp was       sessile. The polyp was removed with a cold biopsy forceps. Resection and       retrieval were complete. Verification of patient identification for the       specimen was done. Estimated blood loss was minimal.      The exam was otherwise without abnormality on direct and retroflexion       views. Impression:               - Three diminutive polyps in the ascending colon                            and in the cecum, removed  with a cold snare.                            Resected and retrieved.                           - One diminutive polyp in the descending colon,                            removed with a cold biopsy forceps. Resected and                            retrieved.                           - The examination was otherwise normal on direct                            and retroflexion views. Moderate Sedation:      N/A- Per Anesthesia Care Recommendation:           - Patient has a contact number available for                            emergencies. The signs and symptoms of potential                            delayed complications were discussed with the                            patient. Return to normal activities tomorrow.                            Written discharge instructions were provided to the                            patient.                           - Resume previous diet.                           - Continue present medications.                           -  Repeat colonoscopy is recommended. The                            colonoscopy date will be determined after pathology                            results from today's exam become available for                            review. Procedure Code(s):        --- Professional ---                           [phone], Colonoscopy, flexible; with removal of                            tumor(s), polyp(s), or other lesion(s) by snare                            technique                           45380, 2, Colonoscopy, flexible; with biopsy,                            single or multiple Diagnosis Code(s):        --- Professional ---                           Z12.11, Encounter for screening for malignant                            neoplasm of colon                           D12.2, Benign neoplasm of ascending colon                           D12.0, Benign neoplasm of cecum                           D12.4, Benign neoplasm of descending  colon CPT copyright 2016 [employer]. All rights reserved. The codes documented in this report are preliminary and upon coder review may  be revised to meet current compliance requirements. Gatha Mayer, MD 11/02/2016 10:13:30 AM This report has been signed electronically. Number of Addenda: 0

## 2016-11-02 NOTE — H&P (Signed)
[facility] History and Physical   Primary Care Physician:  Garret Reddish, MD   Reason for Procedure:   colon cancer screening  Plan:    Colonoscopy - The risks and benefits as well as alternatives of endoscopic procedure(s) have been discussed and reviewed. All questions answered. The patient agrees to proceed.   HPI: Preston Pennington is a 66 y.o. male here for routine screening colonoscopy.   Past Medical History:  Diagnosis Date  . Diabetes mellitus without complication ([place])   . Difficult intubation    "mouth was not wide enough"  . Hypertension   . Prostate enlargement 2010   See's Urologist     Past Surgical History:  Procedure Laterality Date  . LEG SURGERY Right 1997   accident related    Prior to Admission medications   Medication Sig Start Date End Date Taking? Authorizing Provider  alfuzosin (UROXATRAL) 10 MG 24 hr tablet Take 10 mg by mouth every evening.    Yes Historical Provider, MD  allopurinol (ZYLOPRIM) 100 MG tablet Take 1 tablet (100 mg total) by mouth daily. 06/14/16  Yes Marin Olp, MD  amLODipine (NORVASC) 10 MG tablet Take 1 tablet (10 mg total) by mouth daily. 06/14/16  Yes Marin Olp, MD  COLCRYS 0.6 MG tablet TAKE 2 PILLS BY MOUTH AT THE FIRST SIGN OF GOUT THEN 1 PILL TWO HOURS LATER THEN 1 PILL BY MOUTH EVERYDAY UNTIL FLARE RESOLVED Patient taking differently: TAKE 1 TABLET (0.6 MG) BY MOUTH DAILY IN THE MORNING 07/04/16  Yes Marin Olp, MD  finasteride (PROSCAR) 5 MG tablet Take 5 mg by mouth daily.   Yes Historical Provider, MD  glipiZIDE (GLUCOTROL) 5 MG tablet Take 1 tablet (5 mg total) by mouth daily before breakfast. 06/14/16  Yes Marin Olp, MD  metFORMIN (GLUCOPHAGE) 500 MG tablet Take 1 tablet (500 mg total) by mouth 2 (two) times daily with a meal. 06/14/16  Yes Marin Olp, MD  Multiple Vitamin (MULTIVITAMIN WITH MINERALS) TABS tablet Take 1 tablet by mouth daily.   Yes Historical Provider, MD  Na Sulfate-K  Sulfate-Mg Sulf (SUPREP BOWEL PREP KIT) 17.5-3.13-1.6 GM/180ML SOLN Use as directed 10/31/16  Yes Gatha Mayer, MD  quinapril (ACCUPRIL) 20 MG tablet Take 1 tablet (20 mg total) by mouth at bedtime. 06/14/16  Yes Marin Olp, MD  acetaminophen (TYLENOL) 500 MG tablet Take 1,000 mg by mouth every 6 (six) hours as needed (for pain).    Historical Provider, MD    Current Facility-Administered Medications  Medication Dose Route Frequency Provider Last Rate Last Dose  . lactated ringers infusion   Intravenous Continuous Gatha Mayer, MD 20 mL/hr at 11/02/16 0911      Allergies as of 09/25/2016  . (No Known Allergies)    Family History  Problem Relation Age of Onset  . Other Father     and mother- states died of old age. States no medical problems in parents or siblings  . Colon cancer Neg Hx     Social History   Social History  . Marital status: Married    Spouse name: N/A  . Number of children: N/A  . Years of education: N/A   Occupational History  . Not on file.   Social History Main Topics  . Smoking status: Never Smoker  . Smokeless tobacco: Never Used  . Alcohol use No  . Drug use: No  . Sexual activity: Not on file   Other Topics Concern  .  Not on file   Social History Narrative   Married. 4 children. 5 grandkids- new grandchild 10/2015 included.    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       Hobbies: enjoys reading novels. Enjoys African authors   Goes to [place]    Review of Systems:  All other review of systems negative except as mentioned in the HPI.  Physical Exam: Vital signs in last 24 hours: Temp:  [98 F (36.7 C)] 98 F (36.7 C) (01/04 0900) Pulse Rate:  [75] 75 (01/04 0900) Resp:  [14] 14 (01/04 0900) BP: (124)/(81) 124/81 (01/04 0900) SpO2:  [99 %] 99 % (01/04 0900) Weight:  [159 lb 9.8 oz (72.4 kg)] 159 lb 9.8 oz (72.4 kg)  (01/04 0900)   General:   Alert,  Well-developed, well-nourished, pleasant and cooperative in NAD Lungs:  Clear throughout to auscultation.   Heart:  Regular rate and rhythm; no murmurs, clicks, rubs,  or gallops. Abdomen:  Soft, nontender and nondistended. Normal bowel sounds.   Neuro/Psych:  Alert and cooperative. Normal mood and affect. A and O x 3   '@Carl'  Simonne Maffucci, MD, [facility] [phone] (pager) 11/02/2016 9:31 AM@

## 2016-11-06 ENCOUNTER — Encounter (HOSPITAL_COMMUNITY): Payer: Self-pay | Admitting: Internal Medicine

## 2016-11-06 ENCOUNTER — Encounter: Payer: Self-pay | Admitting: Family Medicine

## 2016-11-06 DIAGNOSIS — Z860100 Personal history of colon polyps, unspecified: Secondary | ICD-10-CM | POA: Insufficient documentation

## 2016-11-06 DIAGNOSIS — Z860101 Personal history of adenomatous and serrated colon polyps: Secondary | ICD-10-CM

## 2016-11-06 HISTORY — DX: Personal history of adenomatous and serrated colon polyps: Z86.0101

## 2016-11-06 HISTORY — DX: Personal history of colonic polyps: Z86.010

## 2016-11-07 ENCOUNTER — Other Ambulatory Visit: Payer: Self-pay

## 2016-11-07 ENCOUNTER — Telehealth: Payer: Self-pay | Admitting: Family Medicine

## 2016-11-07 DIAGNOSIS — E119 Type 2 diabetes mellitus without complications: Secondary | ICD-10-CM

## 2016-11-07 MED ORDER — GLUCOSE BLOOD VI STRP: ORAL_STRIP | 12 refills | Status: DC

## 2016-11-07 MED ORDER — BLOOD GLUCOSE MONITOR KIT: PACK | 0 refills | Status: DC

## 2016-11-07 MED ORDER — ACCU-CHEK SOFT TOUCH LANCETS MISC: 12 refills | Status: DC

## 2016-11-07 NOTE — Telephone Encounter (Signed)
Prescription for meter, Lancets, and test strips sen to pharmacy as requested. Called and spoke with the patient who states he test his blood sugar twice a day

## 2016-11-07 NOTE — Progress Notes (Signed)
3 adenomas Recall 3 yrs 10/2019 My Chart message sent

## 2016-11-07 NOTE — Telephone Encounter (Signed)
Pt states he needs a new blood sugar meter, his broke.. Pt needs rx for the accu check smart view meter and the strips to go with it.  [facility]

## 2016-11-20 ENCOUNTER — Telehealth: Payer: Self-pay | Admitting: Family Medicine

## 2016-11-20 NOTE — Telephone Encounter (Signed)
Prescription was sent on 11/07/16

## 2016-11-20 NOTE — Telephone Encounter (Signed)
Pt needs a new glucometer accu chek smartview [facility] [address]

## 2016-11-21 ENCOUNTER — Encounter: Payer: Self-pay | Admitting: Internal Medicine

## 2016-11-21 ENCOUNTER — Ambulatory Visit (INDEPENDENT_AMBULATORY_CARE_PROVIDER_SITE_OTHER)
Admission: RE | Admit: 2016-11-21 | Discharge: 2016-11-21 | Disposition: A | Discharge status: Home / Self Care | Payer: BLUE CROSS/BLUE SHIELD | Source: Ambulatory Visit | Attending: Internal Medicine | Admitting: Internal Medicine

## 2016-11-21 ENCOUNTER — Ambulatory Visit (INDEPENDENT_AMBULATORY_CARE_PROVIDER_SITE_OTHER): Payer: BLUE CROSS/BLUE SHIELD | Admitting: Internal Medicine

## 2016-11-21 VITALS — BP 128/80 | HR 78 | Temp 98.0°F | Resp 20 | Wt 153.0 lb

## 2016-11-21 DIAGNOSIS — D696 Thrombocytopenia, unspecified: Secondary | ICD-10-CM | POA: Diagnosis not present

## 2016-11-21 DIAGNOSIS — M25561 Pain in right knee: Secondary | ICD-10-CM | POA: Insufficient documentation

## 2016-11-21 DIAGNOSIS — I10 Essential (primary) hypertension: Secondary | ICD-10-CM | POA: Diagnosis not present

## 2016-11-21 MED ORDER — MELOXICAM 7.5 MG PO TABS: 7.5000 mg | ORAL_TABLET | Freq: Every day | ORAL | 1 refills | Status: DC

## 2016-11-21 NOTE — Assessment & Plan Note (Signed)
Lab Results  Component Value Date   WBC 4.4 06/13/2016   HGB 13.5 06/13/2016   HCT 41.0 06/13/2016   MCV 79.7 06/13/2016   PLT 122.0 (L) 06/13/2016   Minor, doubt hematoma as cause of swelling

## 2016-11-21 NOTE — Progress Notes (Signed)
Subjective:    Patient ID: Preston Pennington, male    DOB: [birth year], 66 y.o.   MRN: [id number]  HPI 66 yo male with hx of right tib/fib fx s/p multiple surguries now requires right shoe lift; here to c/o simple seeming stumble and fall 10 days ago in the home, striking the right medial knee to a wood furniture; has some immediate pain but though at the time it was minor and did not feel need for medical attention.  Since then has had persistent now worsening pain and swelling to medial right knee only, with stiffness, reduced ROM, but without further giveaways or falls.  No prior right knee surgury or known DJD.  No other new history. Pt denies chest pain, increased sob or doe, wheezing, orthopnea, PND, increased LE swelling, palpitations, dizziness or syncope.  Pt denies new neurological symptoms such as new headache, or facial or extremity weakness or numbness Past Medical History:  Diagnosis Date  . Diabetes mellitus without complication ([place])   . Difficult intubation    "mouth was not wide enough"  . Hypertension   . Prostate enlargement 2010   See's Urologist    Past Surgical History:  Procedure Laterality Date  . COLONOSCOPY WITH PROPOFOL N/A 11/02/2016   Procedure: COLONOSCOPY WITH PROPOFOL;  Surgeon: Gatha Mayer, MD;  Location: [facility];  Service: Endoscopy;  Laterality: N/A;  . LEG SURGERY Right 1997   accident related    reports that he has never smoked. He has never used smokeless tobacco. He reports that he does not drink alcohol or use drugs. family history includes Other in his father. No Known Allergies Current Outpatient Prescriptions on File Prior to Visit  Medication Sig Dispense Refill  . acetaminophen (TYLENOL) 500 MG tablet Take 1,000 mg by mouth every 6 (six) hours as needed (for pain).    Marland Kitchen alfuzosin (UROXATRAL) 10 MG 24 hr tablet Take 10 mg by mouth every evening.     Marland Kitchen allopurinol (ZYLOPRIM) 100 MG tablet Take 1 tablet (100 mg total) by mouth daily. 90 tablet 3    . amLODipine (NORVASC) 10 MG tablet Take 1 tablet (10 mg total) by mouth daily. 90 tablet 3  . blood glucose meter kit and supplies KIT Dispense based on patient and insurance preference. Use 2 times daily as directed. E11.9 Accu-Check Smart View Meter 1 each 0  . COLCRYS 0.6 MG tablet TAKE 2 PILLS BY MOUTH AT THE FIRST SIGN OF GOUT THEN 1 PILL TWO HOURS LATER THEN 1 PILL BY MOUTH EVERYDAY UNTIL FLARE RESOLVED (Patient taking differently: TAKE 1 TABLET (0.6 MG) BY MOUTH DAILY IN THE MORNING) 30 tablet 5  . finasteride (PROSCAR) 5 MG tablet Take 5 mg by mouth daily.    Marland Kitchen glipiZIDE (GLUCOTROL) 5 MG tablet Take 1 tablet (5 mg total) by mouth daily before breakfast. 90 tablet 3  . glucose blood test strip Use to check your blood sugar twice daily E11.9 100 each 12  . Lancets (ACCU-CHEK SOFT TOUCH) lancets Use to check your blood sugar twice a day E11.9 100 each 12  . metFORMIN (GLUCOPHAGE) 500 MG tablet Take 1 tablet (500 mg total) by mouth 2 (two) times daily with a meal. 180 tablet 3  . Multiple Vitamin (MULTIVITAMIN WITH MINERALS) TABS tablet Take 1 tablet by mouth daily.    . quinapril (ACCUPRIL) 20 MG tablet Take 1 tablet (20 mg total) by mouth at bedtime. 90 tablet 1   No current facility-administered medications on file prior  to visit.    Review of Systems  Constitutional: Negative for unusual diaphoresis or night sweats HENT: Negative for ear swelling or discharge Eyes: Negative for worsening visual haziness  Respiratory: Negative for choking and stridor.   Gastrointestinal: Negative for distension or worsening eructation Genitourinary: Negative for retention or change in urine volume.  Musculoskeletal: Negative for other MSK pain or swelling Skin: Negative for color change and worsening wound Neurological: Negative for tremors and numbness other than noted  Psychiatric/Behavioral: Negative for decreased concentration or agitation other than above   All other system neg per pt     Objective:   Physical Exam BP 128/80   Pulse 78   Temp 98 F (36.7 C) (Oral)   Resp 20   Wt 153 lb (69.4 kg)   SpO2 97%   BMI 23.96 kg/m  VS noted,  Constitutional: Pt appears in no apparent distress HENT: Head: NCAT.  Right Ear: External ear normal.  Left Ear: External ear normal.  Eyes: . Pupils are equal, round, and reactive to light. Conjunctivae and EOM are normal Neck: Normal range of motion. Neck supple.  Cardiovascular: Normal rate and regular rhythm.   Pulmonary/Chest: Effort normal and breath sounds without rales or wheezing.  Neurological: Pt is alert. Not confused , motor grossly intact Skin: Skin is warm. No rash, no LE edema Psychiatric: Pt behavior is normal. No agitation.  RIght knee with 2+ localized but tender medial swelling, without overlying skin change or bruising, has reduced ROM to full flexion and extension, o/w lockman;s neg    Assessment & Plan:

## 2016-11-21 NOTE — Progress Notes (Signed)
Pre visit review using our clinic review tool, if applicable. No additional management support is needed unless otherwise documented below in the visit note. 

## 2016-11-21 NOTE — Telephone Encounter (Signed)
The order placed was for the meter. It is for the meter and the supplies needed to go with it.

## 2016-11-21 NOTE — Assessment & Plan Note (Signed)
Low recent, now improved and stable overall by history and exam, recent data reviewed with pt, and pt to continue medical treatment as before,  to f/u any worsening symptoms or concerns BP Readings from Last 3 Encounters:  11/21/16 128/80  11/02/16 (!) 87/55  06/13/16 122/78

## 2016-11-21 NOTE — Telephone Encounter (Signed)
Pt states he needs a new METER, not the  Supplies. accu check smart view  [facility] [address]

## 2016-11-21 NOTE — Patient Instructions (Signed)
Please take all new medication as prescribed - the anti-inflammatory pain medication  Please continue all other medications as before, and refills have been done if requested.  Please have the pharmacy call with any other refills you may need.  Please go to the XRAY Department in the Basement (go straight as you get off the elevator) for the x-ray testing  You will be contacted by phone if any changes need to be made immediately.  Otherwise, you will receive a letter about your results with an explanation, but please check with MyChart first.  Please remember to sign up for MyChart if you have not done so, as this will be important to you in the future with finding out test results, communicating by private email, and scheduling acute appointments online when needed.  Please keep your appointments with your specialists as you may have planned  Please call in at 2 wks if not improved, for referral to see Dr Smith/sports medicine in this office

## 2016-11-21 NOTE — Assessment & Plan Note (Signed)
Etiology unclear, s/p fall so cant r/o fx, for film ,pain control, also refer sports medicine to check for meniscal tear

## 2016-11-22 ENCOUNTER — Telehealth: Payer: Self-pay | Admitting: Family Medicine

## 2016-11-22 NOTE — Telephone Encounter (Signed)
° °  Pt request refill of the following:  blood glucose meter kit and supplies KIT   Pt call tyo say he contacted [facility] and they donot have a rx for his new meter. Pt said he has not checked his blood sugar in about 3 days  Phamacy: [facility]

## 2016-11-28 ENCOUNTER — Other Ambulatory Visit: Payer: Self-pay

## 2016-11-28 DIAGNOSIS — E119 Type 2 diabetes mellitus without complications: Secondary | ICD-10-CM

## 2016-11-28 MED ORDER — BLOOD GLUCOSE MONITOR KIT: PACK | 0 refills | Status: DC

## 2016-11-28 NOTE — Telephone Encounter (Signed)
Tried Financial planner but could not get anyone to answer in the pharmacy. I refaxed the prescription over again

## 2016-11-29 NOTE — Telephone Encounter (Signed)
Pt calling stating that he is not worried about the price and if the insurance is going to pay for it he state his life is more important to him then that.  He would like to have the Rx resent to the pharmacy and someone to call the pharmacy as well to let them know that I need [employer] meter with the supplies  Pharm:  [facility].

## 2016-11-30 ENCOUNTER — Other Ambulatory Visit: Payer: Self-pay

## 2016-11-30 DIAGNOSIS — E119 Type 2 diabetes mellitus without complications: Secondary | ICD-10-CM

## 2016-11-30 MED ORDER — BLOOD GLUCOSE MONITOR KIT: PACK | 0 refills | Status: DC

## 2016-12-01 NOTE — Telephone Encounter (Signed)
Called and left a voicemail message asking for a return phone call. I re faxed and spoke to someone at [facility] yesterday. I am trying to confirm he was able to get his meter. IF not, He needs to come by the office and pick up the prescription to take to [facility].

## 2016-12-05 ENCOUNTER — Ambulatory Visit: Payer: Self-pay | Admitting: Internal Medicine

## 2016-12-06 ENCOUNTER — Telehealth: Payer: Self-pay | Admitting: Family Medicine

## 2016-12-06 NOTE — Telephone Encounter (Signed)
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

## 2016-12-07 NOTE — Telephone Encounter (Signed)
Patient picked up prescription today to take to pharmacy

## 2016-12-18 ENCOUNTER — Telehealth: Payer: Self-pay | Admitting: Emergency Medicine

## 2016-12-18 NOTE — Telephone Encounter (Signed)
Pharmacy is requesting Mobic be 15 mg rather than 7.5 mg and pt take 1/2 tablet. Please advise if you are okay with this.

## 2016-12-18 NOTE — Telephone Encounter (Signed)
Ok for verbal 

## 2016-12-19 MED ORDER — MELOXICAM 15 MG PO TABS: 7.5000 mg | ORAL_TABLET | Freq: Every day | ORAL | 0 refills | Status: DC

## 2016-12-20 ENCOUNTER — Ambulatory Visit (INDEPENDENT_AMBULATORY_CARE_PROVIDER_SITE_OTHER): Payer: BLUE CROSS/BLUE SHIELD | Admitting: Internal Medicine

## 2016-12-20 ENCOUNTER — Encounter: Payer: Self-pay | Admitting: Internal Medicine

## 2016-12-20 VITALS — BP 116/78 | HR 67 | Temp 97.7°F | Ht 67.0 in | Wt 152.0 lb

## 2016-12-20 DIAGNOSIS — M25561 Pain in right knee: Secondary | ICD-10-CM

## 2016-12-20 NOTE — Patient Instructions (Signed)
OK to stop or take the pain medication only if needed  Please continue all other medications as before  Please have the pharmacy call with any other refills you may need.  Please keep your appointments with your specialists as you may have planned

## 2016-12-20 NOTE — Assessment & Plan Note (Signed)
Improved exam, now nearly resolved symptomatically and benign exam, film neg for acute, etiology likely strain vs DJD related, to cont nsaid prn only

## 2016-12-20 NOTE — Progress Notes (Signed)
Subjective:    Patient ID: Preston Pennington, male    DOB: 07-08-1951, 66 y.o.   MRN: [id number]  HPI  Here to f/u, states right knee pain at least 80 % improved with all swelling to have resolved.  No limping or favoring, no further twisting or giveaways or falls.  Left knee cont's to be asymptomatic.  No fever, trauma or recent gout Past Medical History:  Diagnosis Date  . Diabetes mellitus without complication ([place])   . Difficult intubation    "mouth was not wide enough"  . Hypertension   . Prostate enlargement 2010   See's Urologist    Past Surgical History:  Procedure Laterality Date  . COLONOSCOPY WITH PROPOFOL N/A 11/02/2016   Procedure: COLONOSCOPY WITH PROPOFOL;  Surgeon: Gatha Mayer, MD;  Location: [facility];  Service: Endoscopy;  Laterality: N/A;  . LEG SURGERY Right 1997   accident related    reports that he has never smoked. He has never used smokeless tobacco. He reports that he does not drink alcohol or use drugs. family history includes Other in his father. No Known Allergies Current Outpatient Prescriptions on File Prior to Visit  Medication Sig Dispense Refill  . acetaminophen (TYLENOL) 500 MG tablet Take 1,000 mg by mouth every 6 (six) hours as needed (for pain).    Marland Kitchen alfuzosin (UROXATRAL) 10 MG 24 hr tablet Take 10 mg by mouth every evening.     Marland Kitchen allopurinol (ZYLOPRIM) 100 MG tablet Take 1 tablet (100 mg total) by mouth daily. 90 tablet 3  . amLODipine (NORVASC) 10 MG tablet Take 1 tablet (10 mg total) by mouth daily. 90 tablet 3  . blood glucose meter kit and supplies KIT Dispense based on patient and insurance preference. Use 2 times daily as directed. E11.9 Accu-Check Smart View Meter 1 each 0  . COLCRYS 0.6 MG tablet TAKE 2 PILLS BY MOUTH AT THE FIRST SIGN OF GOUT THEN 1 PILL TWO HOURS LATER THEN 1 PILL BY MOUTH EVERYDAY UNTIL FLARE RESOLVED (Patient taking differently: TAKE 1 TABLET (0.6 MG) BY MOUTH DAILY IN THE MORNING) 30 tablet 5  . finasteride (PROSCAR) 5  MG tablet Take 5 mg by mouth daily.    Marland Kitchen glipiZIDE (GLUCOTROL) 5 MG tablet Take 1 tablet (5 mg total) by mouth daily before breakfast. 90 tablet 3  . glucose blood test strip Use to check your blood sugar twice daily E11.9 100 each 12  . Lancets (ACCU-CHEK SOFT TOUCH) lancets Use to check your blood sugar twice a day E11.9 100 each 12  . metFORMIN (GLUCOPHAGE) 500 MG tablet Take 1 tablet (500 mg total) by mouth 2 (two) times daily with a meal. 180 tablet 3  . Multiple Vitamin (MULTIVITAMIN WITH MINERALS) TABS tablet Take 1 tablet by mouth daily.    . quinapril (ACCUPRIL) 20 MG tablet Take 1 tablet (20 mg total) by mouth at bedtime. 90 tablet 1   No current facility-administered medications on file prior to visit.    Review of Systems All otherwise neg per pt     Objective:   Physical Exam BP 116/78   Pulse 67   Temp 97.7 F (36.5 C)   Ht '5\' 7"'  (1.702 m)   Wt 152 lb (68.9 kg)   SpO2 99%   BMI 23.81 kg/m  VS noted,  Constitutional: Pt appears in no apparent distress HENT: Head: NCAT.  Right Ear: External ear normal.  Left Ear: External ear normal.  Eyes: . Pupils are equal, round,  and reactive to light. Conjunctivae and EOM are normal Neck: Normal range of motion. Neck supple.  Cardiovascular: Normal rate and regular rhythm.   Pulmonary/Chest: Effort normal and breath sounds without rales or wheezing.  Neurological: Pt is alert. Not confused , motor grossly intact Skin: Skin is warm. No rash, no LE edema Psychiatric: Pt behavior is normal. No agitation.  Left and Right knee without effusion, NT, FROM, without crepitus   RIGHT KNEE - COMPLETE 4+ VIEW IMPRESSION:11/21/2016 1. Deformity of the proximal fibula consistent with old healed fracture.  2. No acute bony abnormality identified.  3. Peripheral vascular disease .    Assessment & Plan:

## 2016-12-23 ENCOUNTER — Other Ambulatory Visit: Payer: Self-pay | Admitting: Family Medicine

## 2016-12-26 ENCOUNTER — Telehealth: Payer: Self-pay | Admitting: Family Medicine

## 2016-12-26 ENCOUNTER — Other Ambulatory Visit: Payer: Self-pay | Admitting: Family Medicine

## 2016-12-26 NOTE — Telephone Encounter (Signed)
Pt need new Rx for COLCRYS   [facility]  [facility] [address].  Pt state that he is out and would like to have it today if possible.

## 2016-12-26 NOTE — Telephone Encounter (Signed)
Prescription was sent to the pharmacy 12/25/2016

## 2016-12-26 NOTE — Telephone Encounter (Signed)
Pt need a written rx colcrys to take to [facility]

## 2017-02-07 ENCOUNTER — Other Ambulatory Visit: Payer: Self-pay | Admitting: Family Medicine

## 2017-03-07 ENCOUNTER — Emergency Department (HOSPITAL_COMMUNITY): Payer: Worker's Compensation

## 2017-03-07 ENCOUNTER — Emergency Department (HOSPITAL_COMMUNITY)
Admission: EM | Admit: 2017-03-07 | Discharge: 2017-03-08 | Disposition: A | Discharge status: Home / Self Care | Payer: Worker's Compensation | Attending: Emergency Medicine | Admitting: Emergency Medicine

## 2017-03-07 ENCOUNTER — Encounter (HOSPITAL_COMMUNITY): Payer: Self-pay | Admitting: *Deleted

## 2017-03-07 DIAGNOSIS — Y929 Unspecified place or not applicable: Secondary | ICD-10-CM | POA: Diagnosis not present

## 2017-03-07 DIAGNOSIS — Y990 Civilian activity done for income or pay: Secondary | ICD-10-CM | POA: Insufficient documentation

## 2017-03-07 DIAGNOSIS — I10 Essential (primary) hypertension: Secondary | ICD-10-CM | POA: Diagnosis not present

## 2017-03-07 DIAGNOSIS — S8011XA Contusion of right lower leg, initial encounter: Secondary | ICD-10-CM | POA: Diagnosis not present

## 2017-03-07 DIAGNOSIS — Y939 Activity, unspecified: Secondary | ICD-10-CM | POA: Insufficient documentation

## 2017-03-07 DIAGNOSIS — W228XXA Striking against or struck by other objects, initial encounter: Secondary | ICD-10-CM | POA: Diagnosis not present

## 2017-03-07 DIAGNOSIS — Z79899 Other long term (current) drug therapy: Secondary | ICD-10-CM | POA: Diagnosis not present

## 2017-03-07 DIAGNOSIS — E119 Type 2 diabetes mellitus without complications: Secondary | ICD-10-CM | POA: Diagnosis not present

## 2017-03-07 DIAGNOSIS — Z7984 Long term (current) use of oral hypoglycemic drugs: Secondary | ICD-10-CM | POA: Insufficient documentation

## 2017-03-07 DIAGNOSIS — S8991XA Unspecified injury of right lower leg, initial encounter: Secondary | ICD-10-CM | POA: Diagnosis present

## 2017-03-07 NOTE — ED Notes (Signed)
Pt taken to xray 

## 2017-03-07 NOTE — ED Triage Notes (Signed)
Pt works at [employer] and hit his R leg on a bed. Swelling noted to leg, pt has had previous fx in same leg

## 2017-03-08 MED ORDER — IBUPROFEN 400 MG PO TABS: 400.0000 mg | ORAL_TABLET | Freq: Four times a day (QID) | ORAL | 0 refills | Status: DC | PRN

## 2017-03-08 MED ORDER — IBUPROFEN 400 MG PO TABS
600.0000 mg | ORAL_TABLET | Freq: Once | ORAL | Status: AC
  Administered 2017-03-08: 600 mg via ORAL
  Filled 2017-03-08: qty 1

## 2017-03-08 NOTE — ED Provider Notes (Signed)
[facility] DEPT Provider Note   CSN: [id number] Arrival date & time: 03/07/17  2153  By signing my name below, I, Ekam Besson, attest that this documentation has been prepared under the direction and in the presence of Varney Biles, MD. Electronically Signed: Oleh Genin, Scribe. 03/08/17. 12:58 AM.   History   Chief Complaint Chief Complaint  Patient presents with  . Leg Pain    HPI Aldous Housel is a 66 y.o. male with history of diabetes, hypertension, and prior R distal tib-fib repair who presents to the ED for evaluation of leg pain. This patient states that 5 hours ago he accidentally struck the lateral aspect of his R lower leg against a metal table. Since then he has experienced pain over the site with swelling. He has been ambulating with pain since that time. He has a previous injury at the site requiring surgical repair.   The history is provided by the patient. No language interpreter was used.  Leg Pain   The current episode started 3 to 5 hours ago. The problem occurs constantly. The problem has not changed since onset.The pain is present in the right lower leg. The pain is moderate.    Past Medical History:  Diagnosis Date  . Diabetes mellitus without complication ([place])   . Difficult intubation    "mouth was not wide enough"  . Hypertension   . Prostate enlargement 2010   See's Urologist     Patient Active Problem List   Diagnosis Date Noted  . Right knee pain 11/21/2016  . History of adenomatous polyp of colon 11/06/2016  . Chronic ankle pain 06/14/2016  . Thrombocytopenia ([place]) 11/29/2015  . Gout 04/30/2015  . Erectile dysfunction 02/04/2015  . Essential hypertension 04/09/2013  . Diabetes mellitus type II, controlled ([place]) 04/09/2013  . BPH (benign prostatic hyperplasia) 04/09/2013    Past Surgical History:  Procedure Laterality Date  . COLONOSCOPY WITH PROPOFOL N/A 11/02/2016   Procedure: COLONOSCOPY WITH PROPOFOL;  Surgeon: Gatha Mayer, MD;  Location: [facility] ENDOSCOPY;  Service: Endoscopy;  Laterality: N/A;  . LEG SURGERY Right 1997   accident related       Home Medications    Prior to Admission medications   Medication Sig Start Date End Date Taking? Authorizing Provider  acetaminophen (TYLENOL) 500 MG tablet Take 1,000 mg by mouth every 6 (six) hours as needed (for pain).    [provider]  alfuzosin (UROXATRAL) 10 MG 24 hr tablet Take 10 mg by mouth every evening.     [provider]  allopurinol (ZYLOPRIM) 100 MG tablet Take 1 tablet (100 mg total) by mouth daily. 06/14/16   Marin Olp, MD  amLODipine (NORVASC) 10 MG tablet Take 1 tablet (10 mg total) by mouth daily. 06/14/16   Marin Olp, MD  blood glucose meter kit and supplies KIT Dispense based on patient and insurance preference. Use 2 times daily as directed. E11.9 [employer] [facility] 11/30/16   Marin Olp, MD  COLCRYS 0.6 MG tablet TAKE 2 TABLETS BY MOUTH AT THE FIRST SIGN OF GOUT THEN 1 PILL 2 HOURS LATER THEN 1 PILL EVERY DAY UNTIL FLARE IS RESOLVED 12/25/16   Marin Olp, MD  COLCRYS 0.6 MG tablet TAKE 2 TABLETS BY MOUTH AT THE FIRST SIGN OF GOUT THEN 1 PILL 2 HOURS LATER THEN 1 PILL EVERY DAY UNTIL FLARE IS RESOLVED 12/26/16   Marin Olp, MD  finasteride (PROSCAR) 5 MG tablet Take 5 mg by  mouth daily.    [provider]  glipiZIDE (GLUCOTROL) 5 MG tablet Take 1 tablet (5 mg total) by mouth daily before breakfast. 06/14/16   Marin Olp, MD  glucose blood test strip Use to check your blood sugar twice daily E11.9 11/07/16   Marin Olp, MD  ibuprofen (ADVIL,MOTRIN) 400 MG tablet Take 1 tablet (400 mg total) by mouth every 6 (six) hours as needed. 03/08/17   Varney Biles, MD  Lancets (ACCU-CHEK SOFT TOUCH) lancets Use to check your blood sugar twice a day E11.9 11/07/16   Marin Olp, MD  meloxicam [facility]) 7.5 MG tablet  11/21/16   [provider]  metFORMIN (GLUCOPHAGE)  500 MG tablet Take 1 tablet (500 mg total) by mouth 2 (two) times daily with a meal. 06/14/16   Marin Olp, MD  Multiple Vitamin (MULTIVITAMIN WITH MINERALS) TABS tablet Take 1 tablet by mouth daily.    [provider]  quinapril (ACCUPRIL) 20 MG tablet TAKE ONE TABLET BY MOUTH AT BEDTIME 02/07/17   Marin Olp, MD    Family History Family History  Problem Relation Age of Onset  . Other Father        and mother- states died of old age. States no medical problems in parents or siblings  . Colon cancer Neg Hx     Social History Social History  Substance Use Topics  . Smoking status: Never Smoker  . Smokeless tobacco: Never Used  . Alcohol use No     Allergies   Patient has no known allergies.   Review of Systems Review of Systems All systems reviewed and are negative for acute change except as noted in the HPI.  Physical Exam Updated Vital Signs BP (!) 151/83   Pulse 68   Temp 98 F (36.7 C) (Oral)   Resp 20   SpO2 100%   Physical Exam  Constitutional: He is oriented to person, place, and time. He appears well-developed and well-nourished.  HENT:  Head: Normocephalic and atraumatic.  Cardiovascular: Normal rate.   Pulmonary/Chest: Effort normal.  Musculoskeletal:  There is swelling to the distal tib-fib to the lateral aspect. There is a old surgical wound to the same site. Skin is warm.  Neurological: He is alert and oriented to person, place, and time.  Skin: Skin is warm and dry.  Psychiatric: He has a normal mood and affect.  Nursing note and vitals reviewed.    ED Treatments / Results  Labs (all labs ordered are listed, but only abnormal results are displayed) Labs Reviewed - No data to display  EKG  EKG Interpretation None       Radiology Dg Tibia/fibula Right  Result Date: 03/07/2017 CLINICAL DATA:  Pt works at [employer] and reports hitting his right lower leg (lateral side just below calf) on a bed while getting a  resident into the bed; he reports hx of 3 surgeries to distal right tib/fib in [place]: [place] - 2 VIEW COMPARISON:  01/30/2014 FINDINGS: Chronic nonunion of distal fibular fracture. Irregularity of the distal tibia consistent with remote fracture in appears unchanged. No radiopaque foreign body or soft tissue gas. IMPRESSION: Remote fracture of the distal tibia, unchanged appearance. Unchanged nonunion of fibular fracture. Electronically Signed   By: Nolon Nations M.D.   On: 03/07/2017 22:38    Procedures Procedures (including critical care time)  Medications Ordered in ED Medications  ibuprofen (ADVIL,MOTRIN) tablet 600 mg (600 mg Oral Given 03/08/17  Averi.English)     Initial Impression / Assessment and Plan / ED Course  I have reviewed the triage vital signs and the nursing notes.  Pertinent labs & imaging results that were available during my care of the patient were reviewed by me and considered in my medical decision making (see chart for details).     Pt with pain to the leg after he struck it to the bed. Xrays show no acute findings. We will d.c.  Final Clinical Impressions(s) / ED Diagnoses   Final diagnoses:  Contusion of right lower leg, initial encounter    New Prescriptions New Prescriptions   IBUPROFEN (ADVIL,MOTRIN) 400 MG TABLET    Take 1 tablet (400 mg total) by mouth every 6 (six) hours as needed.     Varney Biles, MD 03/08/17 [phone]

## 2017-03-08 NOTE — Discharge Instructions (Signed)
Ice the leg, keep it elevated and take motrin.

## 2017-03-16 ENCOUNTER — Ambulatory Visit (INDEPENDENT_AMBULATORY_CARE_PROVIDER_SITE_OTHER): Payer: BLUE CROSS/BLUE SHIELD | Admitting: Family Medicine

## 2017-03-16 ENCOUNTER — Encounter: Payer: Self-pay | Admitting: Family Medicine

## 2017-03-16 VITALS — BP 124/76 | HR 79 | Temp 98.0°F | Ht 67.0 in | Wt 161.0 lb

## 2017-03-16 DIAGNOSIS — M62838 Other muscle spasm: Secondary | ICD-10-CM | POA: Diagnosis not present

## 2017-03-16 DIAGNOSIS — M549 Dorsalgia, unspecified: Secondary | ICD-10-CM | POA: Diagnosis not present

## 2017-03-16 MED ORDER — MELOXICAM 7.5 MG PO TABS: 7.5000 mg | ORAL_TABLET | Freq: Every day | ORAL | 0 refills | Status: DC

## 2017-03-16 MED ORDER — CYCLOBENZAPRINE HCL 5 MG PO TABS: 5.0000 mg | ORAL_TABLET | Freq: Three times a day (TID) | ORAL | 0 refills | Status: DC | PRN

## 2017-03-16 NOTE — Patient Instructions (Addendum)
Schedule follow up visit for 2 weeks. Check in on diabetes and the shoulder  I think this is a muscle spasm  Can use ice 1-2 more days then switch to heat. Usually 20 minutes 3x a day  meloxicam is an antiinflammatory to calm this down  Flexeril can help with the spasm of the muscle  See [place] back sooner if worsening symptoms. Definitely see [place] if chest pain or shortness of breath or fever.

## 2017-03-16 NOTE — Progress Notes (Signed)
Subjective:  Preston Pennington is a 66 y.o. year old very pleasant male patient who presents for/with See problem oriented charting ROS- No chest pain or shortness of breath. No headache or blurry vision. Some pain with arm movement but also has moderate ache at all times.    Past Medical History-  Patient Active Problem List   Diagnosis Date Noted  . Diabetes mellitus type II, controlled ([place]) 04/09/2013    Priority: High  . History of adenomatous polyp of colon 11/06/2016    Priority: Medium  . Chronic ankle pain 06/14/2016    Priority: Medium  . Thrombocytopenia ([place]) 11/29/2015    Priority: Medium  . Gout 04/30/2015    Priority: Medium  . Essential hypertension 04/09/2013    Priority: Medium  . Erectile dysfunction 02/04/2015    Priority: Low  . BPH (benign prostatic hyperplasia) 04/09/2013    Priority: Low  . Right knee pain 11/21/2016    Medications- reviewed and updated Current Outpatient Prescriptions  Medication Sig Dispense Refill  . acetaminophen (TYLENOL) 500 MG tablet Take 1,000 mg by mouth every 6 (six) hours as needed (for pain).    Marland Kitchen alfuzosin (UROXATRAL) 10 MG 24 hr tablet Take 10 mg by mouth every evening.     Marland Kitchen allopurinol (ZYLOPRIM) 100 MG tablet Take 1 tablet (100 mg total) by mouth daily. 90 tablet 3  . amLODipine (NORVASC) 10 MG tablet Take 1 tablet (10 mg total) by mouth daily. 90 tablet 3  . blood glucose meter kit and supplies KIT Dispense based on patient and insurance preference. Use 2 times daily as directed. E11.9 Accu-Check Smart View Meter 1 each 0  . COLCRYS 0.6 MG tablet TAKE 2 TABLETS BY MOUTH AT THE FIRST SIGN OF GOUT THEN 1 PILL 2 HOURS LATER THEN 1 PILL EVERY DAY UNTIL FLARE IS RESOLVED 30 tablet 5  . finasteride (PROSCAR) 5 MG tablet Take 5 mg by mouth daily.    Marland Kitchen glipiZIDE (GLUCOTROL) 5 MG tablet Take 1 tablet (5 mg total) by mouth daily before breakfast. 90 tablet 3  . glucose blood test strip Use to check your blood sugar twice daily E11.9 100  each 12  . ibuprofen (ADVIL,MOTRIN) 400 MG tablet Take 1 tablet (400 mg total) by mouth every 6 (six) hours as needed. 30 tablet 0  . Lancets (ACCU-CHEK SOFT TOUCH) lancets Use to check your blood sugar twice a day E11.9 100 each 12  . meloxicam (MOBIC) 7.5 MG tablet   0  . metFORMIN (GLUCOPHAGE) 500 MG tablet Take 1 tablet (500 mg total) by mouth 2 (two) times daily with a meal. 180 tablet 3  . Multiple Vitamin (MULTIVITAMIN WITH MINERALS) TABS tablet Take 1 tablet by mouth daily.    . quinapril (ACCUPRIL) 20 MG tablet TAKE ONE TABLET BY MOUTH AT BEDTIME 90 tablet 1   No current facility-administered medications for this visit.     Objective: BP 124/76 (BP Location: Left Arm, Patient Position: Sitting, Cuff Size: Large)   Pulse 79   Temp 98 F (36.7 C) (Oral)   Ht _0  (1.702 m)   Wt 161 lb (73 kg)   SpO2 98%   BMI 25.22 kg/m  Gen: NAD, resting comfortably CV: regular rate Lungs: nonlabored Ext: no edema Skin: warm, dry Neuro:  intact upper extremity strength and sensation, normal gait MSK: pain just to right of left shoulder blade on upper portion. Very tight muscle in this area- loosens with massage but causes pain.   Assessment/Plan:  Upper back pain on left side  Muscle spasm S: left shoulder pain (really more left upper back pain) for 3 days. Just to the right of the left shoulder blade. Icing helps some- has done for 3 days.  Moderate level pain- tightness. Having to lay on right side- worse if lays on left side. Pain getting slightly worse. Tylenol has helped some A/P: Unclear original mechanism of original. From AVS "Schedule follow up visit for 2 weeks. Check in on diabetes and the shoulder  I think this is a muscle spasm  Can use ice 1-2 more days then switch to heat. Usually 20 minutes 3x a day  meloxicam is an antiinflammatory to calm this down  Flexeril can help with the spasm of the muscle  See [place] back sooner if worsening symptoms. Definitely see [place] if  chest pain or shortness of breath or fever. "  Meds ordered this encounter  Medications  . meloxicam (MOBIC) 7.5 MG tablet    Sig: Take 1 tablet (7.5 mg total) by mouth daily.    Dispense:  10 tablet    Refill:  0  . cyclobenzaprine (FLEXERIL) 5 MG tablet    Sig: Take 1 tablet (5 mg total) by mouth 3 (three) times daily as needed for muscle spasms.    Dispense:  30 tablet    Refill:  0   Return precautions advised.  Garret Reddish, MD

## 2017-03-27 LAB — HM DIABETES EYE EXAM

## 2017-03-29 ENCOUNTER — Ambulatory Visit (INDEPENDENT_AMBULATORY_CARE_PROVIDER_SITE_OTHER): Payer: BLUE CROSS/BLUE SHIELD | Admitting: Family Medicine

## 2017-03-29 ENCOUNTER — Encounter: Payer: Self-pay | Admitting: Family Medicine

## 2017-03-29 VITALS — BP 118/68 | HR 97 | Temp 98.2°F | Ht 67.0 in | Wt 162.4 lb

## 2017-03-29 DIAGNOSIS — Z23 Encounter for immunization: Secondary | ICD-10-CM | POA: Diagnosis not present

## 2017-03-29 DIAGNOSIS — M1A09X0 Idiopathic chronic gout, multiple sites, without tophus (tophi): Secondary | ICD-10-CM | POA: Diagnosis not present

## 2017-03-29 DIAGNOSIS — M549 Dorsalgia, unspecified: Secondary | ICD-10-CM

## 2017-03-29 DIAGNOSIS — I10 Essential (primary) hypertension: Secondary | ICD-10-CM

## 2017-03-29 DIAGNOSIS — E119 Type 2 diabetes mellitus without complications: Secondary | ICD-10-CM | POA: Diagnosis not present

## 2017-03-29 LAB — CBC
HCT: 41.6 % (ref 39.0–52.0)
HEMOGLOBIN: 13.4 g/dL (ref 13.0–17.0)
MCHC: 32.3 g/dL (ref 30.0–36.0)
MCV: 81 fl (ref 78.0–100.0)
PLATELETS: 101 10*3/uL — AB (ref 150.0–400.0)
RBC: 5.13 Mil/uL (ref 4.22–5.81)
RDW: 13.5 % (ref 11.5–15.5)
WBC: 3.7 10*3/uL — AB (ref 4.0–10.5)

## 2017-03-29 LAB — COMPREHENSIVE METABOLIC PANEL
ALK PHOS: 92 U/L (ref 39–117)
ALT: 31 U/L (ref 0–53)
AST: 22 U/L (ref 0–37)
Albumin: 4.1 g/dL (ref 3.5–5.2)
BILIRUBIN TOTAL: 0.4 mg/dL (ref 0.2–1.2)
BUN: 14 mg/dL (ref 6–23)
CO2: 27 meq/L (ref 19–32)
CREATININE: 0.9 mg/dL (ref 0.40–1.50)
Calcium: 9.3 mg/dL (ref 8.4–10.5)
Chloride: 104 mEq/L (ref 96–112)
GFR: 108.35 mL/min (ref >60.00)
GLUCOSE: 245 mg/dL — AB (ref 70–99)
Potassium: 4.2 mEq/L (ref 3.5–5.1)
Sodium: 138 mEq/L (ref 135–145)
Total Protein: 6.6 g/dL (ref 6.0–8.3)

## 2017-03-29 LAB — LDL CHOLESTEROL, DIRECT: Direct LDL: 98 mg/dL

## 2017-03-29 LAB — HEMOGLOBIN A1C: Hgb A1c MFr Bld: 7.6 % — ABNORMAL HIGH (ref 4.6–6.5)

## 2017-03-29 NOTE — Patient Instructions (Addendum)
Prevnar 13 today  Sign release of information at the check out desk for records from your eye doctor visit  Please stop by lab before you go

## 2017-03-29 NOTE — Assessment & Plan Note (Addendum)
S: previously well controlled. On glipizide 5mg  daily and metformin 500mg  BID. Prior discussions of stopping glipizide but no lows CBGs- 140s in Ams up from 110s previously.  Exercise and diet- active at work. Advised exercise outside of work. Fair amount of veggies but also fair amount of carbs.  Lab Results  Component Value Date   HGBA1C 5.6 06/13/2016   HGBA1C 5.7 11/29/2015   HGBA1C 5.6 06/30/2015   A/P: update a1c and other labs today. Update foot exam. Request eye doctor records- saw 2 days ago.

## 2017-03-29 NOTE — Progress Notes (Signed)
Subjective:  Preston Pennington is a 66 y.o. year old very pleasant male patient who presents for/with See problem oriented charting ROS- No chest pain or shortness of breath. No headache or blurry vision.    Past Medical History-  Patient Active Problem List   Diagnosis Date Noted  . Diabetes mellitus type II, controlled ([place]) 04/09/2013    Priority: High  . History of adenomatous polyp of colon 11/06/2016    Priority: Medium  . Chronic ankle pain 06/14/2016    Priority: Medium  . Thrombocytopenia ([place]) 11/29/2015    Priority: Medium  . Gout 04/30/2015    Priority: Medium  . Essential hypertension 04/09/2013    Priority: Medium  . Erectile dysfunction 02/04/2015    Priority: Low  . BPH (benign prostatic hyperplasia) 04/09/2013    Priority: Low  . Right knee pain 11/21/2016    Medications- reviewed and updated Current Outpatient Prescriptions  Medication Sig Dispense Refill  . acetaminophen (TYLENOL) 500 MG tablet Take 1,000 mg by mouth every 6 (six) hours as needed (for pain).    Marland Kitchen alfuzosin (UROXATRAL) 10 MG 24 hr tablet Take 10 mg by mouth every evening.     Marland Kitchen allopurinol (ZYLOPRIM) 100 MG tablet Take 1 tablet (100 mg total) by mouth daily. 90 tablet 3  . amLODipine (NORVASC) 10 MG tablet Take 1 tablet (10 mg total) by mouth daily. 90 tablet 3  . blood glucose meter kit and supplies KIT Dispense based on patient and insurance preference. Use 2 times daily as directed. E11.9 Accu-Check Smart View Meter 1 each 0  . COLCRYS 0.6 MG tablet TAKE 2 TABLETS BY MOUTH AT THE FIRST SIGN OF GOUT THEN 1 PILL 2 HOURS LATER THEN 1 PILL EVERY DAY UNTIL FLARE IS RESOLVED 30 tablet 5  . cyclobenzaprine (FLEXERIL) 5 MG tablet Take 1 tablet (5 mg total) by mouth 3 (three) times daily as needed for muscle spasms. 30 tablet 0  . finasteride (PROSCAR) 5 MG tablet Take 5 mg by mouth daily.    Marland Kitchen glipiZIDE (GLUCOTROL) 5 MG tablet Take 1 tablet (5 mg total) by mouth daily before breakfast. 90 tablet 3  .  glucose blood test strip Use to check your blood sugar twice daily E11.9 100 each 12  . ibuprofen (ADVIL,MOTRIN) 400 MG tablet Take 1 tablet (400 mg total) by mouth every 6 (six) hours as needed. 30 tablet 0  . Lancets (ACCU-CHEK SOFT TOUCH) lancets Use to check your blood sugar twice a day E11.9 100 each 12  . meloxicam (MOBIC) 7.5 MG tablet Take 1 tablet (7.5 mg total) by mouth daily. 10 tablet 0  . metFORMIN (GLUCOPHAGE) 500 MG tablet Take 1 tablet (500 mg total) by mouth 2 (two) times daily with a meal. 180 tablet 3  . Multiple Vitamin (MULTIVITAMIN WITH MINERALS) TABS tablet Take 1 tablet by mouth daily.    . quinapril (ACCUPRIL) 20 MG tablet TAKE ONE TABLET BY MOUTH AT BEDTIME 90 tablet 1   No current facility-administered medications for this visit.     Objective: BP 118/68 (BP Location: Left Arm, Patient Position: Sitting, Cuff Size: Large)   Pulse 97   Temp 98.2 F (36.8 C) (Oral)   Ht _0  (1.702 m)   Wt 162 lb 6.4 oz (73.7 kg)   SpO2 96%   BMI 25.44 kg/m  Gen: NAD, resting comfortably CV: RRR no murmurs rubs or gallops Lungs: CTAB no crackles, wheeze, rhonchi Abdomen: soft/nontender/nondistended/normal bowel sounds. No rebound or guarding.  Ext:  no edema Skin: warm, dry Neuro: grossly normal, moves all extremities  Diabetic Foot Exam - Simple   Simple Foot Form Diabetic Foot exam was performed with the following findings:  Yes 03/29/2017 10:33 AM  Visual Inspection No deformities, no ulcerations, no other skin breakdown bilaterally:  Yes Sensation Testing Intact to touch and monofilament testing bilaterally:  Yes Pulse Check Posterior Tibialis and Dorsalis pulse intact bilaterally:  Yes Comments    Assessment/Plan:  Upper back pain on left side S: seen for left upper back/shoulder pain for 3 days about 2 weeks ago. Icing was helping. Felt tight to patient and with muscle spasm on exam. Was slightly worsening. We added flexeril and used ice before switching to  heat. Also used meloxicam for inflammation and flexeril for muscle spasm.   Today, at least 50% better. He is pleased with improvement A/P: continue to monitor- if worsens then would consider sports medicine referral of if were to continue to linger.   No problem-specific Assessment & Plan notes found for this encounter.  prevnar 13 today No Follow-up on file.  No orders of the defined types were placed in this encounter.   No orders of the defined types were placed in this encounter.   Return precautions advised.  Garret Reddish, MD

## 2017-03-29 NOTE — Assessment & Plan Note (Signed)
S: controlled on amlodipine 10mg , quinapril 20mg .  ASCVD 10 year risk calculation if age 68-79: elevated as diabetic but with last LDL of 89 (very reasonable) BP Readings from Last 3 Encounters:  03/29/17 118/68  03/16/17 124/76  03/08/17 (!) 151/83  A/P: We discussed blood pressure goal of <140/90. Continue current medications

## 2017-03-29 NOTE — Assessment & Plan Note (Signed)
  S: no gout flares since the fall. Taking allopurinol. Advised use colchicine just for flare ups Lab Results  Component Value Date   LABURIC 5.6 06/13/2016  A/P: continue current medications

## 2017-03-29 NOTE — Addendum Note (Signed)
Addended by: Mariam Dollar, Roselyn Reef M on: 03/29/2017 10:50 AM   Modules accepted: Orders

## 2017-04-03 ENCOUNTER — Other Ambulatory Visit: Payer: Self-pay | Admitting: *Deleted

## 2017-04-03 MED ORDER — METFORMIN HCL 1000 MG PO TABS: 1000.0000 mg | ORAL_TABLET | Freq: Two times a day (BID) | ORAL | 3 refills | Status: DC

## 2017-06-18 ENCOUNTER — Other Ambulatory Visit: Payer: Self-pay | Admitting: Family Medicine

## 2017-06-29 ENCOUNTER — Other Ambulatory Visit: Payer: Self-pay | Admitting: Family Medicine

## 2017-07-04 ENCOUNTER — Encounter: Payer: Self-pay | Admitting: Family Medicine

## 2017-07-04 ENCOUNTER — Ambulatory Visit (INDEPENDENT_AMBULATORY_CARE_PROVIDER_SITE_OTHER): Payer: BLUE CROSS/BLUE SHIELD | Admitting: Family Medicine

## 2017-07-04 ENCOUNTER — Other Ambulatory Visit: Payer: Self-pay

## 2017-07-04 DIAGNOSIS — E119 Type 2 diabetes mellitus without complications: Secondary | ICD-10-CM

## 2017-07-04 DIAGNOSIS — N401 Enlarged prostate with lower urinary tract symptoms: Secondary | ICD-10-CM

## 2017-07-04 DIAGNOSIS — I10 Essential (primary) hypertension: Secondary | ICD-10-CM

## 2017-07-04 DIAGNOSIS — M1A09X0 Idiopathic chronic gout, multiple sites, without tophus (tophi): Secondary | ICD-10-CM

## 2017-07-04 DIAGNOSIS — D696 Thrombocytopenia, unspecified: Secondary | ICD-10-CM

## 2017-07-04 LAB — COMPREHENSIVE METABOLIC PANEL
ALT: 28 U/L (ref 0–53)
AST: 20 U/L (ref 0–37)
Albumin: 4.1 g/dL (ref 3.5–5.2)
Alkaline Phosphatase: 71 U/L (ref 39–117)
BILIRUBIN TOTAL: 0.4 mg/dL (ref 0.2–1.2)
BUN: 12 mg/dL (ref 6–23)
CO2: 26 mEq/L (ref 19–32)
Calcium: 9.5 mg/dL (ref 8.4–10.5)
Chloride: 103 mEq/L (ref 96–112)
Creatinine, Ser: 0.84 mg/dL (ref 0.40–1.50)
GFR: 117.24 mL/min (ref >60.00)
GLUCOSE: 175 mg/dL — AB (ref 70–99)
Potassium: 4.2 mEq/L (ref 3.5–5.1)
SODIUM: 138 meq/L (ref 135–145)
Total Protein: 6.3 g/dL (ref 6.0–8.3)

## 2017-07-04 LAB — CBC WITH DIFFERENTIAL/PLATELET
BASOS PCT: 0.7 % (ref 0.0–3.0)
Basophils Absolute: 0 10*3/uL (ref 0.0–0.1)
Eosinophils Absolute: 0.2 10*3/uL (ref 0.0–0.7)
Eosinophils Relative: 4.7 % (ref 0.0–5.0)
HEMATOCRIT: 43.1 % (ref 39.0–52.0)
Hemoglobin: 13.8 g/dL (ref 13.0–17.0)
Lymphocytes Relative: 42.8 % (ref 12.0–46.0)
Lymphs Abs: 1.5 10*3/uL (ref 0.7–4.0)
MCHC: 31.9 g/dL (ref 30.0–36.0)
MCV: 81.6 fl (ref 78.0–100.0)
MONO ABS: 0.4 10*3/uL (ref 0.1–1.0)
Monocytes Relative: 10.6 % (ref 3.0–12.0)
Neutro Abs: 1.5 10*3/uL (ref 1.4–7.7)
Neutrophils Relative %: 41.2 % — ABNORMAL LOW (ref 43.0–77.0)
PLATELETS: 112 10*3/uL — AB (ref 150.0–400.0)
RBC: 5.29 Mil/uL (ref 4.22–5.81)
RDW: 13.6 % (ref 11.5–15.5)
WBC: 3.6 10*3/uL — ABNORMAL LOW (ref 4.0–10.5)

## 2017-07-04 LAB — HEMOGLOBIN A1C: Hgb A1c MFr Bld: 6 % (ref 4.6–6.5)

## 2017-07-04 LAB — URIC ACID: URIC ACID, SERUM: 4.6 mg/dL (ref 4.0–7.8)

## 2017-07-04 MED ORDER — GLUCOSE BLOOD VI STRP: ORAL_STRIP | 12 refills | Status: DC

## 2017-07-04 MED ORDER — ONETOUCH DELICA LANCETS FINE MISC: 3 refills | Status: DC

## 2017-07-04 NOTE — Progress Notes (Signed)
Subjective:  Preston Pennington is a 66 y.o. year old very pleasant male patient who presents for/with See problem oriented charting ROS- No chest pain or shortness of breath. No headache or blurry vision.    Past Medical History-  Patient Active Problem List   Diagnosis Date Noted  . Diabetes mellitus type II, controlled ([place]) 04/09/2013    Priority: High  . History of adenomatous polyp of colon 11/06/2016    Priority: Medium  . Chronic ankle pain 06/14/2016    Priority: Medium  . Thrombocytopenia ([place]) 11/29/2015    Priority: Medium  . Gout 04/30/2015    Priority: Medium  . Essential hypertension 04/09/2013    Priority: Medium  . Erectile dysfunction 02/04/2015    Priority: Low  . BPH (benign prostatic hyperplasia) 04/09/2013    Priority: Low  . Right knee pain 11/21/2016    Medications- reviewed and updated Current Outpatient Prescriptions  Medication Sig Dispense Refill  . acetaminophen (TYLENOL) 500 MG tablet Take 1,000 mg by mouth every 6 (six) hours as needed (for pain).    Marland Kitchen alfuzosin (UROXATRAL) 10 MG 24 hr tablet Take 10 mg by mouth every evening.     Marland Kitchen allopurinol (ZYLOPRIM) 100 MG tablet TAKE ONE TABLET BY MOUTH ONCE DAILY 90 tablet 3  . amLODipine (NORVASC) 10 MG tablet TAKE ONE TABLET BY MOUTH ONCE DAILY 90 tablet 3  . blood glucose meter kit and supplies KIT Dispense based on patient and insurance preference. Use 2 times daily as directed. E11.9 Accu-Check Smart View Meter 1 each 0  . COLCRYS 0.6 MG tablet TAKE 2 TABLETS BY MOUTH AT THE FIRST SIGN OF GOUT THEN 1 PILL 2 HOURS LATER THEN 1 PILL EVERY DAY UNTIL FLARE IS RESOLVED 30 tablet 5  . COLCRYS 0.6 MG tablet TAKE 2 TABLETS BY MOUTH AT THE FIRST SIGN OF GOUT THEN 1 PILL 2 HOURS LATER THEN 1 PILL DAILY UNTIL FLARE IS RESOLVED 30 tablet 5  . cyclobenzaprine (FLEXERIL) 5 MG tablet Take 1 tablet (5 mg total) by mouth 3 (three) times daily as needed for muscle spasms. 30 tablet 0  . finasteride (PROSCAR) 5 MG tablet Take 5  mg by mouth daily.    Marland Kitchen glipiZIDE (GLUCOTROL) 5 MG tablet Take 1 tablet (5 mg total) by mouth daily before breakfast. 90 tablet 3  . glucose blood test strip Use to check your blood sugar twice daily E11.9 100 each 12  . ibuprofen (ADVIL,MOTRIN) 400 MG tablet Take 1 tablet (400 mg total) by mouth every 6 (six) hours as needed. 30 tablet 0  . Lancets (ACCU-CHEK SOFT TOUCH) lancets Use to check your blood sugar twice a day E11.9 100 each 12  . meloxicam (MOBIC) 7.5 MG tablet Take 1 tablet (7.5 mg total) by mouth daily. 10 tablet 0  . metFORMIN (GLUCOPHAGE) 1000 MG tablet Take 1 tablet (1,000 mg total) by mouth 2 (two) times daily with a meal. 60 tablet 3  . Multiple Vitamin (MULTIVITAMIN WITH MINERALS) TABS tablet Take 1 tablet by mouth daily.    . quinapril (ACCUPRIL) 20 MG tablet TAKE ONE TABLET BY MOUTH AT BEDTIME 90 tablet 1   No current facility-administered medications for this visit.     Objective: BP 118/70 (BP Location: Right Arm, Patient Position: Sitting, Cuff Size: Normal)   Pulse 88   Temp 97.9 F (36.6 C) (Oral)   Ht '5\' 7"'  (1.702 m)   Wt 160 lb 6.4 oz (72.8 kg)   SpO2 97%   BMI  25.12 kg/m  Gen: NAD, resting comfortably CV: RRR no murmurs rubs or gallops Lungs: CTAB no crackles, wheeze, rhonchi Abdomen: soft/nontender/nondistended/normal bowel sounds.  Ext: no edema Skin: warm, dry, no rash  Assessment/Plan:  Diabetes mellitus type II, controlled ([place]) S: mild poorly controlled. On metformin 1082m BID (up from 502mBID with a1c of 7.6 last time), glipizide 43m52maily.  CBGs-  last visit a year ago reported mostly 110s, now 120 or 130 Exercise and diet- not exercising- encouraged to start Lab Results  Component Value Date   HGBA1C 7.6 (H) 03/29/2017   HGBA1C 5.6 06/13/2016   HGBA1C 5.7 11/29/2015   A/P: update a1c with labs today. Had eye exam- get copy  Gout S: no true gout flare since being on allopurinol 100m30means causes minor flares A/P: update uric acid  with goal <6  Essential hypertension S: controlled on  amlodipine 10mg27m quinapril 20mg 26meadings from Last 3 Encounters:  07/04/17 118/70  03/29/17 118/68  03/16/17 124/76  A/P: We discussed blood pressure goal of <140/90. Continue current meds  BPH (benign prostatic hyperplasia) Continue to follow up with [facility]. Has elevated PSA. Reduced now on finasteride. Patient reports negative biopsy in 2017  Thrombocytopenia ([facility]) [place]odgkinsstable thrombocytopenia around 100-150 on last check at 101. Mild leukopenia  On last check A/P: repeat CBC with differential- consider hematology if symptoms worsen  Future Appointments Date Time [place]/2018 9:45 AM [facility]in [facility] None   Return in about 4 months (around 11/03/2017) for follow up- or sooner if needed.  Orders Placed This Encounter  Procedures  . Comprehensive metabolic panel    [place]  . Hemoglobin A1c    [place]  . Uric Acid  . CBC with Differential/Platelet   Return precautions advised.  StepheGarret Reddish

## 2017-07-04 NOTE — Assessment & Plan Note (Signed)
S: no true gout flare since being on allopurinol 100mg . Beans causes minor flares A/P: update uric acid with goal <6

## 2017-07-04 NOTE — Assessment & Plan Note (Signed)
S: mild poorly controlled. On metformin 1000mg  BID (up from 500mg  BID with a1c of 7.6 last time), glipizide 5mg  daily.  CBGs-  last visit a year ago reported mostly 110s, now 120 or 130 Exercise and diet- not exercising- encouraged to start Lab Results  Component Value Date   HGBA1C 7.6 (H) 03/29/2017   HGBA1C 5.6 06/13/2016   HGBA1C 5.7 11/29/2015   A/P: update a1c with labs today. Had eye exam- get copy

## 2017-07-04 NOTE — Assessment & Plan Note (Signed)
S:  stable thrombocytopenia around 100-150 on last check at 101. Mild leukopenia  On last check A/P: repeat CBC with differential- consider hematology if symptoms worsen

## 2017-07-04 NOTE — Assessment & Plan Note (Signed)
S: controlled on  amlodipine 10mg  and quinapril 20mg  BP Readings from Last 3 Encounters:  07/04/17 118/70  03/29/17 118/68  03/16/17 124/76  A/P: We discussed blood pressure goal of <140/90. Continue current meds

## 2017-07-04 NOTE — Assessment & Plan Note (Signed)
Continue to follow up with [facility]. Has elevated PSA. Reduced now on finasteride. Patient reports negative biopsy in 2017

## 2017-07-04 NOTE — Patient Instructions (Addendum)
Sign release of information at the check out desk for last eye exam this year.   Flu shot at work- please let us know when you get this.   Reschedule your 08/02/17 visit for 4 months from today  Please try to do some exercise with long term goal 150 minutes per week OUTSIDE of work  Please stop by lab before you go

## 2017-07-23 ENCOUNTER — Other Ambulatory Visit: Payer: Self-pay | Admitting: Urology

## 2017-07-23 ENCOUNTER — Other Ambulatory Visit: Payer: Self-pay | Admitting: Family Medicine

## 2017-08-02 ENCOUNTER — Ambulatory Visit: Payer: Self-pay | Admitting: Family Medicine

## 2017-08-06 ENCOUNTER — Other Ambulatory Visit: Payer: Self-pay | Admitting: Family Medicine

## 2017-08-21 LAB — PSA: PSA: 4.35

## 2017-08-27 ENCOUNTER — Ambulatory Visit (INDEPENDENT_AMBULATORY_CARE_PROVIDER_SITE_OTHER): Payer: BLUE CROSS/BLUE SHIELD | Admitting: Family Medicine

## 2017-08-27 ENCOUNTER — Ambulatory Visit: Payer: Self-pay | Admitting: Sports Medicine

## 2017-08-27 ENCOUNTER — Encounter: Payer: Self-pay | Admitting: Family Medicine

## 2017-08-27 VITALS — BP 130/80 | HR 92 | Ht 67.0 in | Wt 156.6 lb

## 2017-08-27 DIAGNOSIS — M5412 Radiculopathy, cervical region: Secondary | ICD-10-CM

## 2017-08-27 MED ORDER — MELOXICAM 15 MG PO TABS: 15.0000 mg | ORAL_TABLET | Freq: Every day | ORAL | 0 refills | Status: DC

## 2017-08-27 NOTE — Patient Instructions (Signed)
Try the meloxicam for 2 weeks.  Let us know if not getting better or worsening.  Take care,  Dr Jerline Pain   Cervical Radiculopathy Cervical radiculopathy means that a nerve in the neck is pinched or bruised. This can cause pain or loss of feeling (numbness) that runs from your neck to your arm and fingers. Follow these instructions at home: Managing pain  Take over-the-counter and prescription medicines only as told by your doctor.  If directed, put ice on the injured or painful area. ? Put ice in a plastic bag. ? Place a towel between your skin and the bag. ? Leave the ice on for 20 minutes, 2-3 times per day.  If ice does not help, you can try using heat. Take a warm shower or warm bath, or use a heat pack as told by your doctor.  You may try a gentle neck and shoulder massage. Activity  Rest as needed. Follow instructions from your doctor about any activities to avoid.  Do exercises as told by your doctor or physical therapist. General instructions  If you were given a soft collar, wear it as told by your doctor.  Use a flat pillow when you sleep.  Keep all follow-up visits as told by your doctor. This is important. Contact a doctor if:  Your condition does not improve with treatment. Get help right away if:  Your pain gets worse and is not controlled with medicine.  You lose feeling or feel weak in your hand, arm, face, or leg.  You have a fever.  You have a stiff neck.  You cannot control when you poop or pee (have incontinence).  You have trouble with walking, balance, or talking. This information is not intended to replace advice given to you by your health care provider. Make sure you discuss any questions you have with your health care provider. Document Released: 10/05/2011 Document Revised: 03/23/2016 Document Reviewed: 12/10/2014 Elsevier Interactive Patient Education  [employer].

## 2017-08-27 NOTE — Progress Notes (Signed)
    Subjective:  Preston Pennington is a 66 y.o. male who presents today with a chief complaint of left arm pain.   HPI:  Left arm pain, acute issue Started 3 days ago.  Stable over that time.  Radiates down arm into his hand.  No obvious precipitating events. Pain is constant. Associated with some tingling into his hands. He has not noticed anything else that makes the pain better or worse. He tried some acetaminophen which did not help.  ROS: Per HPI  PMH: Smoking history reviewed.  Never smoker.  Objective:  Physical Exam: BP 130/80   Pulse 92   Ht 5\' 7"  (1.702 m)   Wt 156 lb 9.6 oz (71 kg)   SpO2 98%   BMI 24.53 kg/m   Gen: NAD, resting comfortably MSK: -Left shoulder: No deformities.  Strength 5 out of 5 with abduction, adduction, flexion, and extension.  Normal internal and external strength.  Supraspinatus testing normal.  Sensation to touch intact throughout.  Neurovascularly intact distally. -Neck: No deformities.  Nontender to palpation.  Spurling positive on the left.  Assessment/Plan:  Cervical radiculopathy No red flag signs or symptoms.  We will proceed with conservative management.  Start meloxicam daily for the next 2 weeks.  Discussed avoidance of provocative activities.  Discussed steroid course however patient deferred.  Return precautions reviewed.  Follow-up if not improving in 2-3 weeks.  Consider steroid course and/or physical therapy referral if symptoms not improving.  Preston Pennington. Jerline Pain, MD 08/27/2017 11:11 AM

## 2017-08-30 ENCOUNTER — Encounter: Payer: Self-pay | Admitting: Family Medicine

## 2017-08-30 ENCOUNTER — Other Ambulatory Visit: Payer: Self-pay | Admitting: Family Medicine

## 2017-09-03 ENCOUNTER — Other Ambulatory Visit: Payer: Self-pay | Admitting: Urology

## 2017-09-24 ENCOUNTER — Ambulatory Visit: Payer: BLUE CROSS/BLUE SHIELD | Admitting: Family Medicine

## 2017-09-24 ENCOUNTER — Encounter: Payer: Self-pay | Admitting: Family Medicine

## 2017-09-24 VITALS — BP 128/82 | HR 99 | Temp 98.1°F | Ht 67.0 in | Wt 161.0 lb

## 2017-09-24 DIAGNOSIS — I10 Essential (primary) hypertension: Secondary | ICD-10-CM | POA: Diagnosis not present

## 2017-09-24 DIAGNOSIS — M5412 Radiculopathy, cervical region: Secondary | ICD-10-CM | POA: Diagnosis not present

## 2017-09-24 DIAGNOSIS — E119 Type 2 diabetes mellitus without complications: Secondary | ICD-10-CM

## 2017-09-24 MED ORDER — PREDNISONE 20 MG PO TABS: ORAL_TABLET | ORAL | 0 refills | Status: DC

## 2017-09-24 NOTE — Assessment & Plan Note (Signed)
S: left cervical radiculopathy diagnosed 08/27/17 by Dr. Jerline Pain. Was primarily into left shoulder- that improved but now has more pain into left posterior arm as well as numbness/tingling into fingertips. Denies being worse with head movement A/P: on exam today- Spurling produces pain into left arm as well as numbness/tingling into fingertips. Strongly suspect cervical radiculopathy as previously diagnosed. Lack of improvement on meloxicam- will trial prednisone for 7 days with excellent prior a1c control. If no improvement with this- refer to sports medicine or orthopedics (of note prostatectomy planned dec 7th so likely visit would be sometime after that). We will also ask him to avoid heavy lifting at work- see letter- and see PT- he was able to schedule for early December before his surgery

## 2017-09-24 NOTE — Progress Notes (Signed)
Subjective:  Preston Pennington is a 66 y.o. year old very pleasant male patient who presents for/with See problem oriented charting ROS- no rash over arm. No dropping objects/hand weakness. Does have left arm pain as well as paresthesias. No leg weakness. No incontinence.    Past Medical History-  Patient Active Problem List   Diagnosis Date Noted  . Diabetes mellitus type II, controlled ([place]) 04/09/2013    Priority: High  . History of adenomatous polyp of colon 11/06/2016    Priority: Medium  . Chronic ankle pain 06/14/2016    Priority: Medium  . Thrombocytopenia ([place]) 11/29/2015    Priority: Medium  . Gout 04/30/2015    Priority: Medium  . Essential hypertension 04/09/2013    Priority: Medium  . Erectile dysfunction 02/04/2015    Priority: Low  . BPH (benign prostatic hyperplasia) 04/09/2013    Priority: Low  . Left cervical radiculopathy 09/24/2017  . Right knee pain 11/21/2016    Medications- reviewed and updated Current Outpatient Medications  Medication Sig Dispense Refill  . acetaminophen (TYLENOL) 500 MG tablet Take 1,000 mg by mouth every 6 (six) hours as needed (for pain).    Marland Kitchen alfuzosin (UROXATRAL) 10 MG 24 hr tablet Take 10 mg by mouth every evening.     Marland Kitchen allopurinol (ZYLOPRIM) 100 MG tablet TAKE ONE TABLET BY MOUTH ONCE DAILY 90 tablet 3  . amLODipine (NORVASC) 10 MG tablet TAKE ONE TABLET BY MOUTH ONCE DAILY 90 tablet 3  . blood glucose meter kit and supplies KIT Dispense based on patient and insurance preference. Use 2 times daily as directed. E11.9 Accu-Check Smart View Meter 1 each 0  . COLCRYS 0.6 MG tablet TAKE 2 TABLETS BY MOUTH AT THE FIRST SIGN OF GOUT THEN 1 PILL 2 HOURS LATER THEN 1 PILL DAILY UNTIL FLARE IS RESOLVED 30 tablet 5  . finasteride (PROSCAR) 5 MG tablet Take 5 mg by mouth daily.    Marland Kitchen glipiZIDE (GLUCOTROL) 5 MG tablet TAKE ONE TABLET BY MOUTH ONCE DAILY BEFORE BREAKFAST 90 tablet 3  . glucose blood (ACCU-CHEK SMARTVIEW) test strip Use as instructed  100 each 12  . meloxicam (MOBIC) 15 MG tablet Take 1 tablet (15 mg total) by mouth daily. 30 tablet 0  . metFORMIN (GLUCOPHAGE) 1000 MG tablet TAKE 1 TABLET BY MOUTH TWICE DAILY WITH A MEAL 180 tablet 1  . Multiple Vitamin (MULTIVITAMIN WITH MINERALS) TABS tablet Take 1 tablet by mouth daily.    Glory Rosebush DELICA LANCETS FINE MISC Use to check blood sugar once a day. E11.9 90 each 3  . quinapril (ACCUPRIL) 20 MG tablet TAKE ONE TABLET BY MOUTH AT BEDTIME 90 tablet 1   No current facility-administered medications for this visit.     Objective: BP 128/82 (BP Location: Right Arm, Patient Position: Sitting, Cuff Size: Large)   Pulse 99   Temp 98.1 F (36.7 C) (Oral)   Ht _0  (1.702 m)   Wt 161 lb (73 kg)   SpO2 99%   BMI 25.22 kg/m  Gen: NAD, resting comfortably CV: RRR no murmurs rubs or gallops Lungs: CTAB no crackles, wheeze, rhonchi Ext: no edema Skin: warm, dry Spurling to right produces left shoudler blade pain and pain into posterior left arm. spurling to left produces worse pain as well as numbness tingling into left hand. No arm weakness.   Assessment/Plan:  Diabetes S: well controlled. On  metformin 1g BID, glipizide 53m daily.   Lab Results  Component Value Date   HGBA1C  6.0 07/04/2017   HGBA1C 7.6 (H) 03/29/2017   HGBA1C 5.6 06/13/2016  A/P: reasonable to trial prednisone given excellent a1c control  Hypertension S: controlled on amlodipine 40m and quinapril 258m  BP Readings from Last 3 Encounters:  09/24/17 128/82  08/27/17 130/80  07/04/17 118/70  A/P: We discussed blood pressure goal of <140/90- continue meds. Suspect BP will tolerate prednisone as well  Left cervical radiculopathy S: left cervical radiculopathy diagnosed 08/27/17 by Dr. PaJerline PainWas primarily into left shoulder- that improved but now has more pain into left posterior arm as well as numbness/tingling into fingertips. Denies being worse with head movement A/P: on exam today- Spurling  produces pain into left arm as well as numbness/tingling into fingertips. Strongly suspect cervical radiculopathy as previously diagnosed. Lack of improvement on meloxicam- will trial prednisone for 7 days with excellent prior a1c control. If no improvement with this- refer to sports medicine or orthopedics (of note prostatectomy planned dec 7th so likely visit would be sometime after that). We will also ask him to avoid heavy lifting at work- see letter- and see PT- he was able to schedule for early December before his surgery   Future Appointments  Date Time Provider DeHutchinson12/12/2016  9:30 AM [facility] SUB THERAPIST A [facility] None  11/05/2017  9:00 AM HuMarin OlpMD [facility] None  encouraged follow up if not improving before next visit  Orders Placed This Encounter  Procedures  . Ambulatory referral to Physical Therapy    Referral Priority:   Routine    Referral Type:   Physical Medicine    Referral Reason:   Specialty Services Required    Requested Specialty:   Physical Therapy    Number of Visits Requested:   1    Meds ordered this encounter  Medications  . predniSONE (DELTASONE) 20 MG tablet    Sig: Take 2 pills for 3 days, 1 pill for 4 days    Dispense:  10 tablet    Refill:  0    Return precautions advised.  StGarret ReddishMD

## 2017-09-24 NOTE — Patient Instructions (Addendum)
Let's try a 7 day course of prednisone to calm down the nerve pain coming from the neck  Also schedule with physical therapy at check out desk  Letter for work

## 2017-10-01 ENCOUNTER — Other Ambulatory Visit: Payer: Self-pay

## 2017-10-01 ENCOUNTER — Ambulatory Visit: Payer: BLUE CROSS/BLUE SHIELD | Admitting: Physical Therapy

## 2017-10-01 ENCOUNTER — Encounter: Payer: Self-pay | Admitting: Physical Therapy

## 2017-10-01 DIAGNOSIS — R293 Abnormal posture: Secondary | ICD-10-CM

## 2017-10-01 DIAGNOSIS — M6281 Muscle weakness (generalized): Secondary | ICD-10-CM | POA: Diagnosis not present

## 2017-10-01 DIAGNOSIS — M5412 Radiculopathy, cervical region: Secondary | ICD-10-CM | POA: Diagnosis not present

## 2017-10-01 NOTE — Therapy (Signed)
[facility] [address] [place], [place], [address] Phone: [phone]   Fax:  [phone]  Physical Therapy Evaluation  Patient Details  Name: Preston Pennington MRN: [id number] Date of Birth: 31-Aug-1951 Referring Provider: Dr. Garret Reddish   Encounter Date: 10/01/2017  PT End of Session - 10/01/17 1205    Visit Number  1    Number of Visits  12    Date for PT Re-Evaluation  11/12/17    Authorization Type  [employer] $20 copay; 30 visit limit    PT Start Time  [phone] pt arrived late    PT Stop Time  1008    PT Time Calculation (min)  26 min    Activity Tolerance  Patient tolerated treatment well    Behavior During Therapy  [facility] for tasks assessed/performed       Past Medical History:  Diagnosis Date  . Diabetes mellitus without complication ([place])   . Difficult intubation    "mouth was not wide enough"  . Hypertension   . Prostate enlargement 2010   See's Urologist     Past Surgical History:  Procedure Laterality Date  . COLONOSCOPY WITH PROPOFOL N/A 11/02/2016   Procedure: COLONOSCOPY WITH PROPOFOL;  Surgeon: Gatha Mayer, MD;  Location: [facility];  Service: Endoscopy;  Laterality: N/A;  . LEG SURGERY Right 1997   accident related    There were no vitals filed for this visit.   Subjective Assessment - 10/01/17 0945    Subjective  Pt is a 66 y/o male who presents to OPPT for Lt sided neck pain into LUE. Pt also reports tingling into fingers.  Symptoms began ~ 1 month ago.  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    Pertinent History  DM, HTN    Limitations  House hold activities    Patient Stated Goals  improve pain and tingling    Currently in Pain?  Yes    Pain Score  5  up to 9/10; sometimes 0/10    Pain Location  Neck    Pain Orientation  Left    Pain Descriptors / Indicators  Tingling    Pain Type  Acute pain    Pain Onset  More than a month ago    Pain Frequency  Intermittent    Aggravating Factors   holding objects in Lt hand    Pain Relieving Factors  prednisone, otherwise nothing         [facility] PT Assessment - 10/01/17 0950      Assessment   Medical Diagnosis  Lt cervical radiculopathy    Referring Provider  Dr. Garret Reddish    Onset Date/Surgical Date  08/30/17    Hand Dominance  Right    Next MD Visit  11/05/17      Precautions   Precautions  None      Restrictions   Weight Bearing Restrictions  No      Balance Screen   Has the patient fallen in the past 6 months  No    Has the patient had a decrease in activity level because of a fear of falling?   No    Is the patient reluctant to leave their home because of a fear of falling?   No      Home Environment   Living Environment  Private residence    Living Arrangements  Children daughter, son-in-law, granddaughter (almost 2 y.o)  Available Help at Discharge  Family    Additional Comments  pt reports he is carrying granddaughter a little bit      Prior Function   Level of Independence  Independent    Vocation  Full time employment    Pensions consultant and med aide; some lifting; assiting residents with ADLs    Leisure  watch TV, bingo      Cognition   Overall Cognitive Status  Within Functional Limits for tasks assessed      Posture/Postural Control   Posture/Postural Control  Postural limitations    Postural Limitations  Rounded Shoulders;Forward head;Increased thoracic kyphosis      ROM / Strength   AROM / PROM / Strength  AROM;Strength      AROM   Overall AROM Comments  increase in tingling with flexion, extension, Lt SB, Lt rotation    AROM Assessment Site  Cervical    Cervical Flexion  13    Cervical Extension  25    Cervical - Right Side Bend  23    Cervical - Left Side Bend  17    Cervical - Right Rotation  42    Cervical - Left Rotation  35      Strength   Strength Assessment Site  Shoulder;Elbow;Hand    Right/Left Shoulder   Right;Left    Right Shoulder Flexion  5/5    Right Shoulder ABduction  5/5    Right Shoulder Internal Rotation  5/5    Right Shoulder External Rotation  4/5    Left Shoulder Flexion  4/5    Left Shoulder ABduction  4/5    Left Shoulder Internal Rotation  5/5    Left Shoulder External Rotation  3+/5    Right/Left Elbow  Right;Left    Right Elbow Flexion  5/5    Right Elbow Extension  5/5    Left Elbow Flexion  5/5    Left Elbow Extension  4/5    Right/Left hand  Right;Left    Right Hand Grip (lbs)  57 61, 56, 54    Left Hand Grip (lbs)  46 45, 47, 46      Palpation   Palpation comment  trigger points noted in Lt upper trap, rhomboids, levator and cervical paraspinals      Special Tests    Special Tests  Cervical    Cervical Tests  Spurling's;Dictraction      Spurling's   Findings  Positive    Side  Left      Distraction Test   Findngs  Positive    Comment  slight improvement in radicular symptoms      Ambulation/Gait   Gait Comments  with with mild gait abnormalities due to Rt leg length discrepancy - corrected with shoe lift; unrelated to current diagnosis             Objective measurements completed on examination: See above findings.              PT Education - 10/01/17 1205    Education provided  Yes    Education Details  provided handout for home traction unit to purchase    Person(s) Educated  Patient    Methods  Explanation;Handout    Comprehension  Verbalized understanding          PT Long Term Goals - 10/01/17 1228      PT LONG TERM GOAL #1   Title  verbalize understanding of posture/body mechanics to decrease risk of  reinjury    Status  New    Target Date  11/12/17      PT LONG TERM GOAL #2   Title  independent with HEP    Status  New    Target Date  11/12/17      PT LONG TERM GOAL #3   Title  improve cervical ROM to WNL without pain for improved function    Status  New    Target Date  11/12/17      PT LONG TERM GOAL #4    Title  report centralization of symptoms for improved function and in order to return to full work responsibilities    Status  New    Target Date  11/12/17      PT LONG TERM GOAL #5   Title  improve Lt grip strength to at least 51# for improved strength    Status  New    Target Date  11/12/17             Plan - 10/24/2017 1207    Clinical Impression Statement  Pt is a 66 y/o male who presents to [facility] for neck pain with symptoms radiating into LUE.  Spurling's increased symptoms on Lt and pt reports some decrease in tingling with manual distraction.  Pt may need mechanical traction in which case we will transfer him to clinic most convenient that offers this, but will try home program as well as manual and modalities initially.  If no or limited benefit will consider traction.  Pt demonstrates mild strength deficits, significant postural abnormalities with poor awareness, and decreased ROM with pain affecting functional mobility.  Pt will benefit from PT to address these deficits.      History and Personal Factors relevant to plan of care:  DN, HTN    Clinical Presentation  Stable    Clinical Decision Making  Low    Rehab Potential  Good    Clinical Impairments Affecting Rehab Potential  pt scheduled for surgery 12/7: may have to hold PT for a few weeks    PT Frequency  2x / week    PT Duration  6 weeks    PT Treatment/Interventions  ADLs/Self Care Home Management;Cryotherapy;Electrical Stimulation;Moist Heat;Traction;Therapeutic exercise;Therapeutic activities;Ultrasound;Neuromuscular re-education;Patient/family education;Manual techniques;Taping;Dry needling    PT Next Visit Plan  give HEP for posture and cervical radiculopathy, manual/modalities/DN PRN    Consulted and Agree with Plan of Care  Patient       Patient will benefit from skilled therapeutic intervention in order to improve the following deficits and impairments:  Pain, Impaired UE functional use, Decreased strength,  Increased muscle spasms, Increased fascial restricitons, Decreased range of motion, Postural dysfunction, Improper body mechanics  Visit Diagnosis: Radiculopathy, cervical region - Plan: PT plan of care cert/re-cert  Abnormal posture - Plan: PT plan of care cert/re-cert  Muscle weakness (generalized) - Plan: PT plan of care cert/re-cert  [facility] PT PB G-CODES - Oct 24, 2017 02/18/31    Functional Assessment Tool Used   clinical judgement    Functional Limitations  Carrying, moving and handling objects    Carrying, Moving and Handling Objects Current Status (Y6378)  At least 40 percent but less than 60 percent impaired, limited or restricted    Carrying, Moving and Handling Objects Goal Status (H8850)  At least 1 percent but less than 20 percent impaired, limited or restricted        Problem List Patient Active Problem List   Diagnosis Date Noted  . Left cervical radiculopathy  09/24/2017  . Right knee pain 11/21/2016  . History of adenomatous polyp of colon 11/06/2016  . Chronic ankle pain 06/14/2016  . Thrombocytopenia ([place]) 11/29/2015  . Gout 04/30/2015  . Erectile dysfunction 02/04/2015  . Essential hypertension 04/09/2013  . Diabetes mellitus type II, controlled ([place]) 04/09/2013  . BPH (benign prostatic hyperplasia) 04/09/2013      Laureen Abrahams, PT, DPT 10/01/17 12:43 PM    [facility] [place], [place], [address] Phone: [phone]   Fax:  [phone]  Name: Janis Cuffe MRN: [id number] Date of Birth: 11/20/1950

## 2017-10-01 NOTE — Patient Instructions (Addendum)
Preston Pennington  10/01/2017   Your procedure is scheduled on: 10-05-17   Report to [facility] Main  Entrance Take [facility]  elevators to 3rd floor to  [place] at 5:30 AM.    Call this number if you have problems the morning of surgery [phone]   Remember: ONLY 1 PERSON MAY GO WITH YOU TO SHORT STAY TO GET  READY MORNING OF [place].  Do not eat food or drink liquids :After Midnight.   Please consume a Clear Liquid Diet on the day before surgery     CLEAR LIQUID DIET   Foods Allowed                                                                     Foods Excluded  Coffee and tea, regular and decaf                             liquids that you cannot  Plain Jell-O in any flavor                                             see through such as: Fruit ices (not with fruit pulp)                                     milk, soups, orange juice  Iced Popsicles                                    All solid food Carbonated beverages, regular and diet                                    Cranberry, grape and apple juices Sports drinks like Gatorade Lightly seasoned clear broth or consume(fat free) Sugar, honey syrup  Sample Menu Breakfast                                Lunch                                     Supper Cranberry juice                    Beef broth                            Chicken broth Jell-O                                     Grape juice  Apple juice Coffee or tea                        Jell-O                                      Popsicle                                                Coffee or tea                        Coffee or tea  _____________________________________________________________________     Take these medicines the morning of surgery with A SIP OF WATER: Amlodipine (Norvasc) and Allopurinol (Zyloprin). You may also bring and use your eyedrops as needed. DO NOT TAKE ANY DIABETIC MEDICATIONS DAY OF  YOUR SURGERY                               You may not have any metal on your body including hair pins and              piercings  Do not wear jewelry, lotions, powders or deodorant             Men may shave face and neck.   Do not bring valuables to the hospital. [place].  Contacts, dentures or bridgework may not be worn into surgery.  Leave suitcase in the car. After surgery it may be brought to your room.               Please read over the following fact sheets you were given: _____________________________________________________________________ How to Manage Your Diabetes Before and After Surgery  Why is it important to control my blood sugar before and after surgery? . Improving blood sugar levels before and after surgery helps healing and can limit problems. . A way of improving blood sugar control is eating a healthy diet by: o  Eating less sugar and carbohydrates o  Increasing activity/exercise o  Talking with your doctor about reaching your blood sugar goals . High blood sugars (greater than 180 mg/dL) can raise your risk of infections and slow your recovery, so you will need to focus on controlling your diabetes during the weeks before surgery. . Make sure that the doctor who takes care of your diabetes knows about your planned surgery including the date and location.  How do I manage my blood sugar before surgery? . Check your blood sugar at least 4 times a day, starting 2 days before surgery, to make sure that the level is not too high or low. o Check your blood sugar the morning of your surgery when you wake up and every 2 hours until you get to the Short Stay unit. . If your blood sugar is less than 70 mg/dL, you will need to treat for low blood sugar: o Do not take insulin. o Treat a low blood sugar (less than 70 mg/dL) with  cup of clear juice (cranberry or apple), 4 glucose tablets, OR glucose gel. o Recheck blood  sugar  in 15 minutes after treatment (to make sure it is greater than 70 mg/dL). If your blood sugar is not greater than 70 mg/dL on recheck, call [phone] for further instructions. . Report your blood sugar to the short stay nurse when you get to Short Stay.  . If you are admitted to the hospital after surgery: o Your blood sugar will be checked by the staff and you will probably be given insulin after surgery (instead of oral diabetes medicines) to make sure you have good blood sugar levels. o The goal for blood sugar control after surgery is 80-180 mg/dL.   WHAT DO I DO ABOUT MY DIABETES MEDICATION?  Marland Kitchen Do not take oral diabetes medicines (pills) the morning of surgery.  . THE DAY BEFORE SURGERY, take your usual morning dose of Glipizide. Take your usual dose of Metformin          Patient Signature:  Date:   Nurse Signature:  Date:   Reviewed and Endorsed by [facility] Patient Education Committee, August 2015            [facility] - Preparing for Surgery Before surgery, you can play an important role.  Because skin is not sterile, your skin needs to be as free of germs as possible.  You can reduce the number of germs on your skin by washing with CHG (chlorahexidine gluconate) soap before surgery.  CHG is an antiseptic cleaner which kills germs and bonds with the skin to continue killing germs even after washing. Please DO NOT use if you have an allergy to CHG or antibacterial soaps.  If your skin becomes reddened/irritated stop using the CHG and inform your nurse when you arrive at Short Stay. Do not shave (including legs and underarms) for at least 48 hours prior to the first CHG shower.  You may shave your face/neck. Please follow these instructions carefully:  1.  Shower with CHG Soap the night before surgery and the  morning of Surgery.  2.  If you choose to wash your hair, wash your hair first as usual with your  normal  shampoo.  3.  After you shampoo, rinse your hair  and body thoroughly to remove the  shampoo.                           4.  Use CHG as you would any other liquid soap.  You can apply chg directly  to the skin and wash                       Gently with a scrungie or clean washcloth.  5.  Apply the CHG Soap to your body ONLY FROM THE NECK DOWN.   Do not use on face/ open                           Wound or open sores. Avoid contact with eyes, ears mouth and genitals (private parts).                       Wash face,  Genitals (private parts) with your normal soap.             6.  Wash thoroughly, paying special attention to the area where your surgery  will be performed.  7.  Thoroughly rinse your body with warm water from the neck down.  8.  DO  NOT shower/wash with your normal soap after using and rinsing off  the CHG Soap.                9.  Pat yourself dry with a clean towel.            10.  Wear clean pajamas.            11.  Place clean sheets on your bed the night of your first shower and do not  sleep with pets. Day of Surgery : Do not apply any lotions/deodorants the morning of surgery.  Please wear clean clothes to the hospital/surgery center.  FAILURE TO FOLLOW THESE INSTRUCTIONS MAY RESULT IN THE CANCELLATION OF YOUR SURGERY PATIENT SIGNATURE_________________________________  NURSE SIGNATURE__________________________________  ________________________________________________________________________

## 2017-10-03 ENCOUNTER — Other Ambulatory Visit: Payer: Self-pay

## 2017-10-03 ENCOUNTER — Encounter (HOSPITAL_COMMUNITY)
Admission: RE | Admit: 2017-10-03 | Discharge: 2017-10-03 | Disposition: A | Discharge status: Home / Self Care | Payer: BLUE CROSS/BLUE SHIELD | Source: Ambulatory Visit | Attending: Urology | Admitting: Urology

## 2017-10-03 ENCOUNTER — Encounter (HOSPITAL_COMMUNITY): Payer: Self-pay

## 2017-10-03 LAB — BASIC METABOLIC PANEL
ANION GAP: 8 (ref 5–15)
BUN: 16 mg/dL (ref 6–20)
CALCIUM: 9.5 mg/dL (ref 8.9–10.3)
CO2: 25 mmol/L (ref 22–32)
Chloride: 105 mmol/L (ref 101–111)
Creatinine, Ser: 0.94 mg/dL (ref 0.61–1.24)
GFR calc Af Amer: >60 mL/min (ref >60)
GLUCOSE: 160 mg/dL — AB (ref 65–99)
Potassium: 4 mmol/L (ref 3.5–5.1)
SODIUM: 138 mmol/L (ref 135–145)

## 2017-10-03 LAB — CBC
HCT: 43.6 % (ref 39.0–52.0)
HEMOGLOBIN: 14.9 g/dL (ref 13.0–17.0)
MCH: 26.7 pg (ref 26.0–34.0)
MCHC: 34.2 g/dL (ref 30.0–36.0)
MCV: 78.1 fL (ref 78.0–100.0)
Platelets: 93 10*3/uL — ABNORMAL LOW (ref 150–400)
RBC: 5.58 MIL/uL (ref 4.22–5.81)
RDW: 13.5 % (ref 11.5–15.5)
WBC: 4.8 10*3/uL (ref 4.0–10.5)

## 2017-10-03 LAB — HEMOGLOBIN A1C
HEMOGLOBIN A1C: 6.1 % — AB (ref 4.8–5.6)
MEAN PLASMA GLUCOSE: 128.37 mg/dL

## 2017-10-03 LAB — GLUCOSE, CAPILLARY: GLUCOSE-CAPILLARY: 179 mg/dL — AB (ref 65–99)

## 2017-10-03 NOTE — Progress Notes (Signed)
10-03-17 CBC result routed to Dr. Alyson Ingles for review.

## 2017-10-03 NOTE — Progress Notes (Signed)
Spoke to Dr. Frankey Shown regarding pt's past history with 'difficulty  intubation. Pt reports that he cannot open his mouth 'more than two fingers width, and stated that in prior surgeries, anesthesia  was unaware. Per Dr. Frankey Shown, no need to assess pt at this time. They have ways to address challenging airways. Pt will be assessed day of surgery.

## 2017-10-04 ENCOUNTER — Ambulatory Visit: Payer: BLUE CROSS/BLUE SHIELD | Admitting: Physical Therapy

## 2017-10-04 ENCOUNTER — Encounter: Payer: Self-pay | Admitting: Physical Therapy

## 2017-10-04 DIAGNOSIS — M5412 Radiculopathy, cervical region: Secondary | ICD-10-CM

## 2017-10-04 DIAGNOSIS — R293 Abnormal posture: Secondary | ICD-10-CM | POA: Diagnosis not present

## 2017-10-04 DIAGNOSIS — M6281 Muscle weakness (generalized): Secondary | ICD-10-CM

## 2017-10-04 NOTE — Therapy (Addendum)
[facility] [address] [place], [place], [address] Phone: [phone]   Fax:  [phone]  Physical Therapy Treatment/Discharge  Patient Details  Name: Preston Pennington MRN: [id number] Date of Birth: 09-May-1951 Referring Provider: Dr. Garret Reddish   Encounter Date: 10/04/2017  PT End of Session - 10/04/17 1217    Visit Number  2    Number of Visits  12    Date for PT Re-Evaluation  11/12/17    Authorization Type  [employer] $20 copay; 30 visit limit    PT Start Time  1015    PT Stop Time  1055    PT Time Calculation (min)  40 min    Activity Tolerance  Patient tolerated treatment well    Behavior During Therapy  [facility] for tasks assessed/performed       Past Medical History:  Diagnosis Date  . Diabetes mellitus without complication ([place])   . Difficult intubation    "mouth was not wide enough"  . Hypertension   . Prostate enlargement 2010   See's Urologist     Past Surgical History:  Procedure Laterality Date  . COLONOSCOPY WITH PROPOFOL N/A 11/02/2016   Procedure: COLONOSCOPY WITH PROPOFOL;  Surgeon: Gatha Mayer, MD;  Location: [facility] ENDOSCOPY;  Service: Endoscopy;  Laterality: N/A;  . LEG SURGERY Right 1997   accident related    There were no vitals filed for this visit.  Subjective Assessment - 10/04/17 1015    Subjective  doing well; no changes to neck pain at this time; tingling has been less in hands    Pertinent History  DM, HTN    Limitations  House hold activities    Patient Stated Goals  improve pain and tingling    Currently in Pain?  Yes    Pain Score  5     Pain Location  Neck    Pain Orientation  Left    Pain Descriptors / Indicators  Tingling    Pain Type  Acute pain    Pain Onset  More than a month ago    Pain Frequency  Intermittent    Aggravating Factors   holding objects in Lt hand    Pain Relieving Factors  prednisone, otherwise nothing                      [facility] Adult PT  Treatment/Exercise - 10/04/17 1035      Exercises   Exercises  Neck      Neck Exercises: Theraband   Rows  10 reps;Red mod cues for technique    Rows Limitations  standing    Shoulder External Rotation  10 reps;Red    Shoulder External Rotation Limitations  supine    Horizontal ABduction  10 reps;Red    Horizontal ABduction Limitations  supine      Neck Exercises: Supine   Neck Retraction  10 reps;5 secs      Manual Therapy   Manual Therapy  Soft tissue mobilization;Myofascial release;Manual Traction    Soft tissue mobilization  Lt UT/cervical parapsinals/levator scapula    Myofascial Release  TPR to Lt c-spine paraspinals, upper trap and levator scapula; suboccipital release    Manual Traction  10x10 sec holds with 30 sec rest      Neck Exercises: Stretches   Other Neck Stretches  doorway stretch 3x30 sec                  PT Long Term Goals - 10/01/17  [place] #1   Title  verbalize understanding of posture/body mechanics to decrease risk of reinjury    Status  New    [place] Date  11/12/17      PT LONG TERM GOAL #2   Title  independent with HEP    Status  New    [place] Date  11/12/17      PT LONG TERM GOAL #3   Title  improve cervical ROM to WNL without pain for improved function    Status  New    [place] Date  11/12/17      PT LONG TERM GOAL #4   Title  report centralization of symptoms for improved function and in order to return to full work responsibilities    Status  New    [place] Date  11/12/17      PT LONG TERM GOAL #5   Title  improve Lt grip strength to at least 51# for improved strength    Status  New    [place] Date  11/12/17            Plan - 10/04/17 1224    Clinical Impression Statement  HEP initiated today for posture and pt tolerated well needing min/mod cues for proper technique.  Pt will continue to benefit from PT to maximize function.    PT Treatment/Interventions  ADLs/Self Care Home  Management;Cryotherapy;Electrical Stimulation;Moist Heat;Traction;Therapeutic exercise;Therapeutic activities;Ultrasound;Neuromuscular re-education;Patient/family education;Manual techniques;Taping;Dry needling    PT Next Visit Plan  review HEP, manual/modalities/DN PRN    Consulted and Agree with Plan of Care  Patient       Patient will benefit from skilled therapeutic intervention in order to improve the following deficits and impairments:  Pain, Impaired UE functional use, Decreased strength, Increased muscle spasms, Increased fascial restricitons, Decreased range of motion, Postural dysfunction, Improper body mechanics  Visit Diagnosis: Radiculopathy, cervical region  Abnormal posture  Muscle weakness (generalized)     Problem List Patient Active Problem List   Diagnosis Date Noted  . Left cervical radiculopathy 09/24/2017  . Right knee pain 11/21/2016  . History of adenomatous polyp of colon 11/06/2016  . Chronic ankle pain 06/14/2016  . Thrombocytopenia ([place]) 11/29/2015  . Gout 04/30/2015  . Erectile dysfunction 02/04/2015  . Essential hypertension 04/09/2013  . Diabetes mellitus type II, controlled ([place]) 04/09/2013  . BPH (benign prostatic hyperplasia) 04/09/2013      Laureen Abrahams, PT, DPT 10/04/17 12:28 PM    [facility] [place], [place], [address] Phone: [phone]   Fax:  [phone]  Name: Corben Auzenne MRN: [id number] Date of Birth: [birth year]      PHYSICAL THERAPY DISCHARGE SUMMARY  Visits from Start of Care: 2  Current functional level related to goals / functional outcomes: See above; pt did not return following surgery   Remaining deficits: See above; unknown remaining deficits as pt did not return   Education / Equipment: HEP  Plan: Patient agrees to discharge.  Patient goals were not met. Patient is being discharged due to not returning since the last visit.  ?????     Laureen Abrahams, PT, DPT 11/30/17 8:26 AM   [place], [place], [address] Phone: [phone]  Fax: [phone]

## 2017-10-04 NOTE — Patient Instructions (Signed)
Supine Push    Take a deep breath and exhale while pushing the back of neck down on the bed. Hold __5__ seconds. Repeat __10__ times. Do __2-3_ sessions per day.   Scapula Adduction With Pectoralis Stretch: Low - Standing   Shoulders at 45 hands even with shoulders, keeping weight through legs, shift weight forward until you feel pull or stretch through the front of your chest. Hold _30__ seconds. Do _3__ times, _2-3__ times per day.  Scapular Retraction: Rowing (Eccentric) - Arms - Side (Resistance Band)    Hold end of band in each hand. Pull back until elbows are even with trunk. Keep elbows by sides, thumbs up. Hold for 3-5 seconds. Use ___red_____ resistance band. _10__ reps per set, _2-3__ sets per day, __7_ days per week.

## 2017-10-05 ENCOUNTER — Inpatient Hospital Stay (HOSPITAL_COMMUNITY)
Admission: RE | Admit: 2017-10-05 | Discharge: 2017-10-08 | DRG: 707 | Disposition: A | Discharge status: Home / Self Care | Payer: BLUE CROSS/BLUE SHIELD | Source: Ambulatory Visit | Attending: Urology | Admitting: Urology

## 2017-10-05 ENCOUNTER — Encounter (HOSPITAL_COMMUNITY)
Admission: RE | Disposition: A | Discharge status: Home / Self Care | Payer: Self-pay | Source: Ambulatory Visit | Attending: Urology

## 2017-10-05 ENCOUNTER — Inpatient Hospital Stay (HOSPITAL_COMMUNITY): Payer: BLUE CROSS/BLUE SHIELD | Admitting: Certified Registered Nurse Anesthetist

## 2017-10-05 ENCOUNTER — Other Ambulatory Visit: Payer: Self-pay

## 2017-10-05 ENCOUNTER — Encounter (HOSPITAL_COMMUNITY): Payer: Self-pay | Admitting: *Deleted

## 2017-10-05 DIAGNOSIS — N401 Enlarged prostate with lower urinary tract symptoms: Secondary | ICD-10-CM | POA: Diagnosis present

## 2017-10-05 DIAGNOSIS — Z7984 Long term (current) use of oral hypoglycemic drugs: Secondary | ICD-10-CM | POA: Diagnosis not present

## 2017-10-05 DIAGNOSIS — I10 Essential (primary) hypertension: Secondary | ICD-10-CM | POA: Diagnosis present

## 2017-10-05 DIAGNOSIS — E119 Type 2 diabetes mellitus without complications: Secondary | ICD-10-CM | POA: Diagnosis present

## 2017-10-05 DIAGNOSIS — N138 Other obstructive and reflux uropathy: Secondary | ICD-10-CM | POA: Diagnosis present

## 2017-10-05 HISTORY — PX: XI ROBOTIC ASSISTED SIMPLE PROSTATECTOMY: SHX6713

## 2017-10-05 LAB — GLUCOSE, CAPILLARY
GLUCOSE-CAPILLARY: 164 mg/dL — AB (ref 65–99)
GLUCOSE-CAPILLARY: 190 mg/dL — AB (ref 65–99)
GLUCOSE-CAPILLARY: 181 mg/dL — AB (ref 65–99)
GLUCOSE-CAPILLARY: 239 mg/dL — AB (ref 65–99)
Glucose-Capillary: 218 mg/dL — ABNORMAL HIGH (ref 65–99)

## 2017-10-05 LAB — HEMOGLOBIN AND HEMATOCRIT, BLOOD
HCT: 45.4 % (ref 39.0–52.0)
Hemoglobin: 15 g/dL (ref 13.0–17.0)

## 2017-10-05 SURGERY — PROSTATECTOMY, SIMPLE, ROBOT-ASSISTED
Anesthesia: General

## 2017-10-05 MED ORDER — QUINAPRIL HCL 10 MG PO TABS: 20.0000 mg | ORAL_TABLET | Freq: Every day | ORAL | Status: DC

## 2017-10-05 MED ORDER — LACTATED RINGERS IV SOLN
INTRAVENOUS | Status: DC | PRN
  Administered 2017-10-05 (×2): via INTRAVENOUS

## 2017-10-05 MED ORDER — DEXAMETHASONE SODIUM PHOSPHATE 10 MG/ML IJ SOLN
INTRAMUSCULAR | Status: AC
  Filled 2017-10-05: qty 1

## 2017-10-05 MED ORDER — SODIUM CHLORIDE 0.9 % IR SOLN
3000.0000 mL | Status: DC
  Administered 2017-10-05: 3000 mL

## 2017-10-05 MED ORDER — BUPIVACAINE LIPOSOME 1.3 % IJ SUSP
INTRAMUSCULAR | Status: DC | PRN
  Administered 2017-10-05: 20 mL

## 2017-10-05 MED ORDER — SODIUM CHLORIDE 0.9 % IJ SOLN
INTRAMUSCULAR | Status: DC | PRN
  Administered 2017-10-05: 10 mL

## 2017-10-05 MED ORDER — PHENYLEPHRINE HCL 10 MG/ML IJ SOLN
INTRAMUSCULAR | Status: DC | PRN
  Administered 2017-10-05 (×3): 80 ug via INTRAVENOUS

## 2017-10-05 MED ORDER — CEFAZOLIN SODIUM-DEXTROSE 2-4 GM/100ML-% IV SOLN
INTRAVENOUS | Status: AC
  Filled 2017-10-05: qty 100

## 2017-10-05 MED ORDER — SODIUM CHLORIDE 0.9 % IJ SOLN
INTRAMUSCULAR | Status: AC
  Filled 2017-10-05: qty 20

## 2017-10-05 MED ORDER — DIPHENHYDRAMINE HCL 50 MG/ML IJ SOLN
12.5000 mg | Freq: Four times a day (QID) | INTRAMUSCULAR | Status: DC | PRN
  Administered 2017-10-06: 12.5 mg via INTRAVENOUS
  Filled 2017-10-05: qty 1

## 2017-10-05 MED ORDER — DIPHENHYDRAMINE HCL 12.5 MG/5ML PO ELIX: 12.5000 mg | ORAL_SOLUTION | Freq: Four times a day (QID) | ORAL | Status: DC | PRN

## 2017-10-05 MED ORDER — PREDNISONE 20 MG PO TABS
20.0000 mg | ORAL_TABLET | Freq: Two times a day (BID) | ORAL | Status: DC
  Administered 2017-10-05 – 2017-10-08 (×5): 20 mg via ORAL
  Filled 2017-10-05 (×6): qty 1

## 2017-10-05 MED ORDER — ONDANSETRON HCL 4 MG/2ML IJ SOLN
INTRAMUSCULAR | Status: DC | PRN
  Administered 2017-10-05: 4 mg via INTRAVENOUS

## 2017-10-05 MED ORDER — ONDANSETRON HCL 4 MG/2ML IJ SOLN
4.0000 mg | INTRAMUSCULAR | Status: DC | PRN
  Administered 2017-10-05: 4 mg via INTRAVENOUS
  Filled 2017-10-05: qty 2

## 2017-10-05 MED ORDER — CEFAZOLIN SODIUM-DEXTROSE 2-4 GM/100ML-% IV SOLN
2.0000 g | INTRAVENOUS | Status: AC
  Administered 2017-10-05: 2 g via INTRAVENOUS

## 2017-10-05 MED ORDER — PHENOL 1.4 % MT LIQD
1.0000 | OROMUCOSAL | Status: DC | PRN
  Administered 2017-10-05: 1 via OROMUCOSAL
  Filled 2017-10-05: qty 177

## 2017-10-05 MED ORDER — ENSURE ENLIVE PO LIQD
1.0000 | Freq: Every day | ORAL | Status: DC
  Administered 2017-10-06 – 2017-10-08 (×2): 237 mL via ORAL

## 2017-10-05 MED ORDER — MENTHOL 3 MG MT LOZG
1.0000 | LOZENGE | OROMUCOSAL | Status: DC | PRN
  Administered 2017-10-05: 3 mg via ORAL
  Filled 2017-10-05: qty 9

## 2017-10-05 MED ORDER — LIDOCAINE 2% (20 MG/ML) 5 ML SYRINGE
INTRAMUSCULAR | Status: AC
  Filled 2017-10-05: qty 5

## 2017-10-05 MED ORDER — HYDROCODONE-ACETAMINOPHEN 5-325 MG PO TABS: 1.0000 | ORAL_TABLET | Freq: Four times a day (QID) | ORAL | 0 refills | Status: DC | PRN

## 2017-10-05 MED ORDER — SULFAMETHOXAZOLE-TRIMETHOPRIM 800-160 MG PO TABS: 1.0000 | ORAL_TABLET | Freq: Two times a day (BID) | ORAL | 0 refills | Status: DC

## 2017-10-05 MED ORDER — BUPIVACAINE LIPOSOME 1.3 % IJ SUSP
20.0000 mL | Freq: Once | INTRAMUSCULAR | Status: DC
  Filled 2017-10-05: qty 20

## 2017-10-05 MED ORDER — ALLOPURINOL 100 MG PO TABS
100.0000 mg | ORAL_TABLET | Freq: Every day | ORAL | Status: DC
  Administered 2017-10-05 – 2017-10-08 (×4): 100 mg via ORAL
  Filled 2017-10-05 (×4): qty 1

## 2017-10-05 MED ORDER — DEXAMETHASONE SODIUM PHOSPHATE 10 MG/ML IJ SOLN
INTRAMUSCULAR | Status: DC | PRN
  Administered 2017-10-05: 10 mg via INTRAVENOUS

## 2017-10-05 MED ORDER — SODIUM CHLORIDE 0.45 % IV SOLN
INTRAVENOUS | Status: DC
  Administered 2017-10-05 – 2017-10-06 (×2): via INTRAVENOUS

## 2017-10-05 MED ORDER — HYDROMORPHONE HCL 1 MG/ML IJ SOLN: 0.5000 mg | INTRAMUSCULAR | Status: DC | PRN

## 2017-10-05 MED ORDER — HYDROMORPHONE HCL 1 MG/ML IJ SOLN
INTRAMUSCULAR | Status: AC
  Filled 2017-10-05: qty 1

## 2017-10-05 MED ORDER — INSULIN ASPART 100 UNIT/ML ~~LOC~~ SOLN
SUBCUTANEOUS | Status: AC
Start: 2017-10-05 — End: 2017-10-05
  Filled 2017-10-05: qty 1

## 2017-10-05 MED ORDER — INSULIN ASPART 100 UNIT/ML ~~LOC~~ SOLN
0.0000 [IU] | Freq: Three times a day (TID) | SUBCUTANEOUS | Status: DC
Start: 2017-10-05 — End: 2017-10-08
  Administered 2017-10-05: 3 [IU] via SUBCUTANEOUS
  Administered 2017-10-05: 5 [IU] via SUBCUTANEOUS
  Administered 2017-10-06 (×2): 3 [IU] via SUBCUTANEOUS
  Administered 2017-10-06: 5 [IU] via SUBCUTANEOUS
  Administered 2017-10-07: 2 [IU] via SUBCUTANEOUS
  Administered 2017-10-07: 5 [IU] via SUBCUTANEOUS
  Administered 2017-10-08: 2 [IU] via SUBCUTANEOUS
  Administered 2017-10-08: 5 [IU] via SUBCUTANEOUS

## 2017-10-05 MED ORDER — LACTATED RINGERS IV SOLN: INTRAVENOUS | Status: DC

## 2017-10-05 MED ORDER — FENTANYL CITRATE (PF) 100 MCG/2ML IJ SOLN
INTRAMUSCULAR | Status: DC | PRN
  Administered 2017-10-05 (×4): 50 ug via INTRAVENOUS

## 2017-10-05 MED ORDER — ONDANSETRON HCL 4 MG/2ML IJ SOLN
INTRAMUSCULAR | Status: AC
  Filled 2017-10-05: qty 2

## 2017-10-05 MED ORDER — ACETAMINOPHEN 325 MG PO TABS: 650.0000 mg | ORAL_TABLET | ORAL | Status: DC | PRN

## 2017-10-05 MED ORDER — PROPOFOL 10 MG/ML IV BOLUS
INTRAVENOUS | Status: DC | PRN
  Administered 2017-10-05: 160 mg via INTRAVENOUS

## 2017-10-05 MED ORDER — MEPERIDINE HCL 50 MG/ML IJ SOLN: 6.2500 mg | INTRAMUSCULAR | Status: DC | PRN

## 2017-10-05 MED ORDER — HYDROMORPHONE HCL 1 MG/ML IJ SOLN
INTRAMUSCULAR | Status: DC | PRN
  Administered 2017-10-05 (×3): 0.5 mg via INTRAVENOUS

## 2017-10-05 MED ORDER — HYDROMORPHONE HCL 2 MG/ML IJ SOLN
INTRAMUSCULAR | Status: AC
  Filled 2017-10-05: qty 1

## 2017-10-05 MED ORDER — SUGAMMADEX SODIUM 200 MG/2ML IV SOLN
INTRAVENOUS | Status: DC | PRN
  Administered 2017-10-05: 200 mg via INTRAVENOUS

## 2017-10-05 MED ORDER — LIDOCAINE 2% (20 MG/ML) 5 ML SYRINGE
INTRAMUSCULAR | Status: DC | PRN
  Administered 2017-10-05: 70 mg via INTRAVENOUS

## 2017-10-05 MED ORDER — PROMETHAZINE HCL 25 MG/ML IJ SOLN: 6.2500 mg | INTRAMUSCULAR | Status: DC | PRN

## 2017-10-05 MED ORDER — FENTANYL CITRATE (PF) 250 MCG/5ML IJ SOLN
INTRAMUSCULAR | Status: AC
  Filled 2017-10-05: qty 5

## 2017-10-05 MED ORDER — EPHEDRINE SULFATE 50 MG/ML IJ SOLN
INTRAMUSCULAR | Status: DC | PRN
  Administered 2017-10-05: 5 mg via INTRAVENOUS
  Administered 2017-10-05: 10 mg via INTRAVENOUS

## 2017-10-05 MED ORDER — HYDROCODONE-ACETAMINOPHEN 5-325 MG PO TABS
1.0000 | ORAL_TABLET | ORAL | Status: DC | PRN
  Administered 2017-10-05 – 2017-10-07 (×4): 2 via ORAL
  Administered 2017-10-08: 1 via ORAL
  Administered 2017-10-08: 2 via ORAL
  Filled 2017-10-05 (×6): qty 2

## 2017-10-05 MED ORDER — PROPOFOL 10 MG/ML IV BOLUS
INTRAVENOUS | Status: AC
  Filled 2017-10-05: qty 20

## 2017-10-05 MED ORDER — LACTATED RINGERS IR SOLN
Status: DC | PRN
  Administered 2017-10-05: 1000 mL

## 2017-10-05 MED ORDER — LISINOPRIL 20 MG PO TABS
20.0000 mg | ORAL_TABLET | Freq: Every day | ORAL | Status: DC
  Administered 2017-10-05 – 2017-10-07 (×3): 20 mg via ORAL
  Filled 2017-10-05 (×3): qty 1

## 2017-10-05 MED ORDER — ROCURONIUM BROMIDE 50 MG/5ML IV SOSY
PREFILLED_SYRINGE | INTRAVENOUS | Status: DC | PRN
  Administered 2017-10-05: 50 mg via INTRAVENOUS
  Administered 2017-10-05: 10 mg via INTRAVENOUS
  Administered 2017-10-05: 20 mg via INTRAVENOUS

## 2017-10-05 MED ORDER — MIDAZOLAM HCL 5 MG/5ML IJ SOLN
INTRAMUSCULAR | Status: DC | PRN
  Administered 2017-10-05 (×2): 1 mg via INTRAVENOUS

## 2017-10-05 MED ORDER — AMLODIPINE BESYLATE 10 MG PO TABS
10.0000 mg | ORAL_TABLET | Freq: Every day | ORAL | Status: DC
  Administered 2017-10-05 – 2017-10-08 (×4): 10 mg via ORAL
  Filled 2017-10-05 (×4): qty 1

## 2017-10-05 MED ORDER — HYDROMORPHONE HCL 1 MG/ML IJ SOLN
0.2500 mg | INTRAMUSCULAR | Status: DC | PRN
  Administered 2017-10-05 (×2): 0.5 mg via INTRAVENOUS

## 2017-10-05 MED ORDER — ROCURONIUM BROMIDE 50 MG/5ML IV SOSY
PREFILLED_SYRINGE | INTRAVENOUS | Status: AC
  Filled 2017-10-05: qty 5

## 2017-10-05 MED ORDER — SUCCINYLCHOLINE CHLORIDE 200 MG/10ML IV SOSY
PREFILLED_SYRINGE | INTRAVENOUS | Status: AC
  Filled 2017-10-05: qty 10

## 2017-10-05 MED ORDER — MIDAZOLAM HCL 2 MG/2ML IJ SOLN
INTRAMUSCULAR | Status: AC
  Filled 2017-10-05: qty 2

## 2017-10-05 MED ORDER — SUCCINYLCHOLINE CHLORIDE 200 MG/10ML IV SOSY
PREFILLED_SYRINGE | INTRAVENOUS | Status: DC | PRN
  Administered 2017-10-05: 140 mg via INTRAVENOUS

## 2017-10-05 SURGICAL SUPPLY — 58 items
APPLICATOR COTTON TIP 6IN STRL (MISCELLANEOUS) ×2 IMPLANT
APPLICATOR SURGIFLO ENDO (HEMOSTASIS) ×2 IMPLANT
CATH FOLEY 3WAY 30CC 22FR (CATHETERS) IMPLANT
CATH TIEMANN FOLEY 18FR 5CC (CATHETERS) ×2 IMPLANT
CHLORAPREP W/TINT 26ML (MISCELLANEOUS) ×2 IMPLANT
CLOTH BEACON ORANGE TIMEOUT ST (SAFETY) ×2 IMPLANT
COVER SURGICAL LIGHT HANDLE (MISCELLANEOUS) ×2 IMPLANT
COVER TIP SHEARS 8 DVNC (MISCELLANEOUS) ×1 IMPLANT
COVER TIP SHEARS 8MM DA VINCI (MISCELLANEOUS) ×1
CUTTER ECHEON FLEX ENDO 45 340 (ENDOMECHANICALS) ×2 IMPLANT
DECANTER SPIKE VIAL GLASS SM (MISCELLANEOUS) IMPLANT
DERMABOND ADVANCED (GAUZE/BANDAGES/DRESSINGS) ×1
DERMABOND ADVANCED .7 DNX12 (GAUZE/BANDAGES/DRESSINGS) ×1 IMPLANT
DRAPE ARM DVNC X/XI (DISPOSABLE) ×4 IMPLANT
DRAPE COLUMN DVNC XI (DISPOSABLE) ×1 IMPLANT
DRAPE DA VINCI XI ARM (DISPOSABLE) ×4
DRAPE DA VINCI XI COLUMN (DISPOSABLE) ×1
DRSG TEGADERM 4X4.75 (GAUZE/BANDAGES/DRESSINGS) ×2 IMPLANT
ELECT REM PT RETURN 15FT ADLT (MISCELLANEOUS) ×2 IMPLANT
FLOSEAL 10ML (HEMOSTASIS) ×2 IMPLANT
GAUZE SPONGE 2X2 8PLY STRL LF (GAUZE/BANDAGES/DRESSINGS) ×1 IMPLANT
GLOVE BIO SURGEON STRL SZ 6.5 (GLOVE) ×2 IMPLANT
GLOVE BIO SURGEON STRL SZ8 (GLOVE) ×2 IMPLANT
GLOVE BIOGEL PI IND STRL 8 (GLOVE) ×1 IMPLANT
GLOVE BIOGEL PI INDICATOR 8 (GLOVE) ×1
GOWN STRL REUS W/TWL LRG LVL3 (GOWN DISPOSABLE) ×4 IMPLANT
GOWN STRL REUS W/TWL LRG LVL4 (GOWN DISPOSABLE) ×6 IMPLANT
HOLDER FOLEY CATH W/STRAP (MISCELLANEOUS) ×2 IMPLANT
IRRIG SUCT STRYKERFLOW 2 WTIP (MISCELLANEOUS) ×2
IRRIGATION SUCT STRKRFLW 2 WTP (MISCELLANEOUS) ×1 IMPLANT
IV LACTATED RINGERS 1000ML (IV SOLUTION) IMPLANT
NEEDLE INSUFFLATION 14GA 120MM (NEEDLE) ×2 IMPLANT
PACK ROBOT UROLOGY CUSTOM (CUSTOM PROCEDURE TRAY) ×2 IMPLANT
PAD POSITIONING PINK XL (MISCELLANEOUS) ×2 IMPLANT
SEAL CANN UNIV 5-8 DVNC XI (MISCELLANEOUS) ×4 IMPLANT
SEAL XI 5MM-8MM UNIVERSAL (MISCELLANEOUS) ×4
SET IRRIG Y TYPE TUR BLADDER L (SET/KITS/TRAYS/PACK) IMPLANT
SHEET LAVH (DRAPES) IMPLANT
SOLUTION ELECTROLUBE (MISCELLANEOUS) ×2 IMPLANT
SPONGE GAUZE 2X2 STER 10/PKG (GAUZE/BANDAGES/DRESSINGS) ×1
SPONGE LAP 4X18 X RAY DECT (DISPOSABLE) ×2 IMPLANT
STAPLE RELOAD 45 GRN (STAPLE) ×1 IMPLANT
STAPLE RELOAD 45MM GREEN (STAPLE) ×1
SUT ETHILON 3 0 PS 1 (SUTURE) ×2 IMPLANT
SUT MNCRL AB 4-0 PS2 18 (SUTURE) ×4 IMPLANT
SUT PROLENE 0 CT 2 (SUTURE) ×2 IMPLANT
SUT V-LOC BARB 180 2/0GR6 GS22 (SUTURE)
SUT VIC AB 0 CT1 27 (SUTURE) ×2
SUT VIC AB 0 CT1 27XBRD ANTBC (SUTURE) ×2 IMPLANT
SUT VIC AB 2-0 SH 27 (SUTURE) ×3
SUT VIC AB 2-0 SH 27X BRD (SUTURE) ×3 IMPLANT
SUT VICRYL 0 UR6 27IN ABS (SUTURE) ×2 IMPLANT
SUT VLOC BARB 180 ABS3/0GR12 (SUTURE) ×4
SUTURE V-LC BRB 180 2/0GR6GS22 (SUTURE) IMPLANT
SUTURE VLOC BRB 180 ABS3/0GR12 (SUTURE) ×2 IMPLANT
TOWEL OR 17X26 10 PK STRL BLUE (TOWEL DISPOSABLE) ×2 IMPLANT
TOWEL OR NON WOVEN STRL DISP B (DISPOSABLE) ×2 IMPLANT
WATER STERILE IRR 1000ML POUR (IV SOLUTION) IMPLANT

## 2017-10-05 NOTE — Anesthesia Procedure Notes (Signed)
Procedure Name: Intubation Date/Time: 10/05/2017 7:47 AM Performed by: West Pugh, CRNA Pre-anesthesia Checklist: Patient identified, Emergency Drugs available, Suction available, Patient being monitored and Timeout performed Patient Re-evaluated:Patient Re-evaluated prior to induction Oxygen Delivery Method: Circle system utilized Preoxygenation: Pre-oxygenation with 100% oxygen Induction Type: IV induction Ventilation: Mask ventilation without difficulty Laryngoscope Size: Glidescope and 3 Grade View: Grade I Tube type: Parker flex tip Tube size: 7.5 mm Number of attempts: 1 Airway Equipment and Method: Stylet and Video-laryngoscopy Placement Confirmation: ETT inserted through vocal cords under direct vision,  positive ETCO2,  CO2 detector and breath sounds checked- equal and bilateral Secured at: 22 cm Tube secured with: Tape Dental Injury: Teeth and Oropharynx as per pre-operative assessment  Difficulty Due To: Difficulty was anticipated and Difficult Airway- due to limited oral opening Comments: Elective glidescope utilized. Very limited mouth opening despite paralytic. Head and neck maintained in neutral position during video laryngoscopy and placement of ETT.

## 2017-10-05 NOTE — Anesthesia Preprocedure Evaluation (Addendum)
Anesthesia Evaluation  Patient identified by MRN, date of birth, ID band Patient awake    Reviewed: Allergy & Precautions, NPO status , Patient's Chart, lab work & pertinent test results  History of Anesthesia Complications (+) DIFFICULT AIRWAY  Airway Mallampati: IV  TM Distance: >3 FB Neck ROM: Full  Mouth opening: Limited Mouth Opening  Dental  (+) Dental Advisory Given, Chipped,    Pulmonary neg pulmonary ROS,    breath sounds clear to auscultation       Cardiovascular hypertension, Pt. on medications  Rhythm:Regular Rate:Normal     Neuro/Psych  Neuromuscular disease    GI/Hepatic negative GI ROS, Neg liver ROS,   Endo/Other  diabetes, Type 2, Oral Hypoglycemic Agents  Renal/GU      Musculoskeletal negative musculoskeletal ROS (+)   Abdominal Normal abdominal exam  (+)   Peds  Hematology   Anesthesia Other Findings   Reproductive/Obstetrics                            Lab Results  Component Value Date   WBC 4.8 10/03/2017   HGB 14.9 10/03/2017   HCT 43.6 10/03/2017   MCV 78.1 10/03/2017   PLT 93 (L) 10/03/2017   Lab Results  Component Value Date   CREATININE 0.94 10/03/2017   BUN 16 10/03/2017   NA 138 10/03/2017   K 4.0 10/03/2017   CL 105 10/03/2017   CO2 25 10/03/2017   No results found for: INR, PROTIME  EKG: normal sinus rhythm.  Anesthesia Physical Anesthesia Plan  ASA: II  Anesthesia Plan: General   Post-op Pain Management:    Induction: Intravenous and Rapid sequence  PONV Risk Score and Plan: 3 and Ondansetron, Dexamethasone and Midazolam  Airway Management Planned: Oral ETT and Video Laryngoscope Planned  Additional Equipment:   Intra-op Plan:   Post-operative Plan: Extubation in OR  Informed Consent: I have reviewed the patients History and Physical, chart, labs and discussed the procedure including the risks, benefits and alternatives for the  proposed anesthesia with the patient or authorized representative who has indicated his/her understanding and acceptance.   Dental advisory given  Plan Discussed with: CRNA  Anesthesia Plan Comments:         Anesthesia Quick Evaluation

## 2017-10-05 NOTE — Discharge Instructions (Signed)

## 2017-10-05 NOTE — Transfer of Care (Signed)
Immediate Anesthesia Transfer of Care Note  Patient: Preston Pennington  Procedure(s) Performed: XI ROBOTIC ASSISTED SIMPLE PROSTATECTOMY WITH UMBILICAL HERNIA REPAIR (N/A )  Patient Location: PACU  Anesthesia Type:General  Level of Consciousness: awake, oriented and patient cooperative  Airway & Oxygen Therapy: Patient Spontanous Breathing and Patient connected to face mask oxygen  Post-op Assessment: Report given to RN, Post -op Vital signs reviewed and stable and Patient moving all extremities X 4  Post vital signs: Reviewed and stable  Last Vitals:  Vitals:   10/05/17 0555 10/05/17 1110  BP: (!) 138/99 (!) 173/106  Pulse: 80 (!) (P) 110  Resp: 18 (!) 9  Temp: 36.8 C (P) 36.9 C  SpO2: 100% (P) 100%    Last Pain:  Vitals:   10/05/17 0555  TempSrc: Oral      Patients Stated Pain Goal: 4 (57/01/77 9390)  Complications: No apparent anesthesia complications

## 2017-10-05 NOTE — Anesthesia Postprocedure Evaluation (Signed)
Anesthesia Post Note  Patient: Preston Pennington  Procedure(s) Performed: XI ROBOTIC ASSISTED SIMPLE PROSTATECTOMY WITH UMBILICAL HERNIA REPAIR (N/A )     Patient location during evaluation: PACU Anesthesia Type: General Level of consciousness: awake and alert Pain management: pain level controlled Vital Signs Assessment: post-procedure vital signs reviewed and stable Respiratory status: spontaneous breathing, nonlabored ventilation, respiratory function stable and patient connected to nasal cannula oxygen Cardiovascular status: blood pressure returned to baseline and stable Postop Assessment: no apparent nausea or vomiting Anesthetic complications: no    Last Vitals:  Vitals:   10/05/17 1215 10/05/17 1236  BP: (!) 156/90 (!) 150/93  Pulse: 93 95  Resp: 10 12  Temp: 36.9 C 37.2 C  SpO2: 99% 100%    Last Pain:  Vitals:   10/05/17 1215  TempSrc:   PainSc: 3                  Effie Berkshire

## 2017-10-05 NOTE — H&P (Signed)
Urology Admission H&P  Chief Complaint: incomplete emptying  History of Present Illness: Preston Pennington is a 66yo with BPH with incomplete emptying who has failed medical therapy. His prostate US shows a 180g gland  Past Medical History:  Diagnosis Date  . Diabetes mellitus without complication ([place])   . Difficult intubation    "mouth was not wide enough"  . Hypertension   . Prostate enlargement 2010   See's Urologist    Past Surgical History:  Procedure Laterality Date  . COLONOSCOPY WITH PROPOFOL N/A 11/02/2016   Procedure: COLONOSCOPY WITH PROPOFOL;  Surgeon: Gatha Mayer, MD;  Location: [facility];  Service: Endoscopy;  Laterality: N/A;  . LEG SURGERY Right 1997   accident related    Home Medications:  Current Facility-Administered Medications  Medication Dose Route Frequency Provider Last Rate Last Dose  . bupivacaine liposome (EXPAREL) 1.3 % injection 266 mg  20 mL Infiltration Once Cleon Gustin, MD      . ceFAZolin (ANCEF) 2-4 GM/100ML-% IVPB           . ceFAZolin (ANCEF) IVPB 2g/100 mL premix  2 g Intravenous 30 min Pre-Op , Candee Furbish, MD       Facility-Administered Medications Ordered in Other Encounters  Medication Dose Route Frequency Provider Last Rate Last Dose  . lactated ringers infusion    Continuous PRN West Pugh, CRNA       Allergies: No Known Allergies  Family History  Problem Relation Age of Onset  . Other Father        and mother- states died of old age. States no medical problems in parents or siblings  . Colon cancer Neg Hx    Social History:  reports that  has never smoked. he has never used smokeless tobacco. He reports that he does not drink alcohol or use drugs.  Review of Systems  Genitourinary: Positive for frequency and urgency.  All other systems reviewed and are negative.   Physical Exam:  Vital signs in last 24 hours: Temp:  [98.2 F (36.8 C)] 98.2 F (36.8 C) (12/07 0555) Pulse Rate:  [80] 80 (12/07 0555) Resp:   [18] 18 (12/07 0555) BP: (138)/(99) 138/99 (12/07 0555) SpO2:  [100 %] 100 % (12/07 0555) Weight:  [70.3 kg (155 lb)] 70.3 kg (155 lb) (12/07 0617) Physical Exam  Constitutional: He is oriented to person, place, and time. He appears well-developed and well-nourished.  HENT:  Head: Normocephalic and atraumatic.  Eyes: EOM are normal. Pupils are equal, round, and reactive to light.  Neck: Normal range of motion. No thyromegaly present.  Cardiovascular: Normal rate and regular rhythm.  Respiratory: Effort normal. No respiratory distress.  GI: Soft. He exhibits no distension.  Musculoskeletal: Normal range of motion. He exhibits no edema.  Neurological: He is alert and oriented to person, place, and time.  Skin: Skin is warm and dry.  Psychiatric: He has a normal mood and affect. His behavior is normal. Judgment and thought content normal.    Laboratory Data:  Results for orders placed or performed during the hospital encounter of 10/05/17 (from the past 24 hour(s))  Glucose, capillary     Status: Abnormal   Collection Time: 10/05/17  6:00 AM  Result Value Ref Range   Glucose-Capillary 164 (H) 65 - 99 mg/dL   No results found for this or any previous visit (from the past 240 hour(s)). Creatinine: Recent Labs    10/03/17 1015  CREATININE 0.94   Baseline Creatinine: 0.9  Impression/Assessment:  66yo with BPH with incomplete emptyin  Plan:  THe risks/benefits/alternatives to robotic simple prostatectomy was explained to the patient and he understands and wishes to proceed with surgery  Preston Pennington 10/05/2017, 7:29 AM

## 2017-10-05 NOTE — Op Note (Signed)
PREOPERATIVE DIAGNOSIS: BPH with incomplete emptying  POSTOPERATIVE DIAGNOSIS: Same  PROCEDURES: 1. Robotic-assisted laparoscopic simple prostatectomy.  ANESTHESIA: General  ASSISTANT: Clemetine Marker, PA  RESIDENT: none  ESTIMATED BLOOD LOSS: 300 mL.  COMPLICATIONS: None.  SPECIMEN: 1.prostatic adenoma  ANTIBIOTICS: ancef  FINDINGS: 4cmcm intravesical prostatic protrusion. Ureteral orifices in normal anatomic location. No leaks from cystotomy at 150cc of water.   DRAINS: 1. Jackson-Pratt drain to bulb suction. 2. Foley catheter to straight drain.  INDICATION: Preston Pennington is a very pleasant 66 year old gentleman, who has BPH with significant LUTS including elevated PVR. His TRUS volume is 180cc.  Options were discussed with the patient in detail for primary manage including continued surveillance protocols versus surgical extirpation with and without minimally invasive assistance and he wished to proceed with robotic simple prostatectomy. Informed consent was obtained and placed in the medical record.  PROCEDURE IN DETAIL: The patient was brought to the operating room and a breif timeout was down to ensure correct patient, correct procedure, and correct site. Intravenous antibiotics were administered. General endotracheal anesthesia was introduced. The patient was placed into a low lithotomy position after tucking his arms with foam padding, placing on a pink and non-slide foam pad. A test of steep Trendelenburg positioning was performed and he was found to be suitably positioned. Sterile field was created by prepping and draping the patient's penis, perineum and proximal thighs using iodine and his infra-xiphoid abdomen using chlorhexidine gluconate. Next, a high-flow, low-pressure pneumoperitoneum was obtained using Veress technique in the infraumbilical midline having passed the aspiration and drop test. Next, a 8-mm robotic camera port was placed in the same  location. Laparoscopic examination of the peritoneal cavity revealed no significant adhesions and no visceral injury. Additional ports were placed as follows: Right paramedian 8-mm robotic port, right far lateral 12-mm assistant port, left paramedian 8-mm robotic port, left far lateral 8-mm robotic port, and right paramedian 5-mm suction port. Robot was docked and passed through the electronic checks. Next, attention was directed for the development of space of Retzius. Incision was made lateral to the left medial umbilical ligament from the midline towards the area of the internal ring and coursing along the iliac vessels towards the area of the ureter, which was positively identified. The left bladder was dissected away from the pelvic sidewall towards the area of the endopelvic fascia. A mirror-imaged dissection was performed on the right side.  Additional anterior attachments were taken down using cautery scissors. Next, the bladder neck was identified moving the Foley catheter back and forth. We then made a 6cm transverse cystotomy 3cm from the bladder neck. We identified the ureteral orifices and care was taken to exclude then from the dissection. We then placed 3 holding stitches in the anterior bladder wall and secured it to Coopers ligament.  We then made a circumscribing incision around the base of the prostate. We then used a 0 vicryl in a figure of eight fashion in the base of the adenoma for traction. We proceeded with a posterior dissection of the prostate until we identified the capsule. We then used a combination of electrocautery, blunt and sharp dissection to free the adenoma from the capsule. We then dissected laterally to the apex. Individual bleeders were cauterized. We then dissected anteriorly along the capsule until we reached the apex. The adenoma was then freed and placed in an endocatch bag. We noted good hemostasis and no additional sutures were placed. A Foley  catheter was then placed per urethra easily. We then tacked  down the bladder neck to the prostatic fossa with a single interrupted 2-0 vicryl. We then proceeded to closed the cystotomy. We closed the bladder with a running 0 vicryl full thickness. We then performed a imbricating second layer  Closure with 0 vicryl. The bladder was then filled with 150cc of water and we noted no leak.  All sponge and needle counts were correct. A closed suction drain was brought to the previous left lateral robotic port site into the area of the peritoneal cavity. The previous right 12-mm assistant port was closed at the level of the fascia using a Carter-Thomason suture passer under laparoscopic vision. Robot was undocked. Specimen was retrieved by extending the previous camera port site inferiorly for distance approximately 3 cm and removing the prostatectomy specimens and setting aside for permanent pathology. The site was closed at the level of fascia using figure-of-eight 0 vicryl followed by reapproximation of the Scarpa's using running Vicryl. All incision sites were infiltrated with dilute Lyophilized Marcaine and closed at the level of the skin using subcuticular Monocryl followed by Dermabond. Procedure was then terminated. The patient tolerated the procedure well. There were no immediate periprocedural complications and the patient was taken to the postanesthesia care unit in stable Condition. Debbrah Alar was utilized for retraction, passage of instruments and suture, and for suction for visualization of the operative field. COMPLICATIONS: None  CONDITION: Stable, extubated, transferred to PACU  PLAN: The patient will be admitted for 1-2 for hydration, post operative monitoring and pain control. He will be discharged home with foley in place and foley will be removed in 14 days, He will have a cystogram prior to foley catheter removal

## 2017-10-06 LAB — BASIC METABOLIC PANEL
Anion gap: 5 (ref 5–15)
BUN: 9 mg/dL (ref 6–20)
CALCIUM: 9.1 mg/dL (ref 8.9–10.3)
CO2: 25 mmol/L (ref 22–32)
CREATININE: 0.85 mg/dL (ref 0.61–1.24)
Chloride: 108 mmol/L (ref 101–111)
GFR calc Af Amer: >60 mL/min (ref >60)
GLUCOSE: 205 mg/dL — AB (ref 65–99)
Potassium: 4.6 mmol/L (ref 3.5–5.1)
SODIUM: 138 mmol/L (ref 135–145)

## 2017-10-06 LAB — GLUCOSE, CAPILLARY
Glucose-Capillary: 157 mg/dL — ABNORMAL HIGH (ref 65–99)
Glucose-Capillary: 212 mg/dL — ABNORMAL HIGH (ref 65–99)
Glucose-Capillary: 166 mg/dL — ABNORMAL HIGH (ref 65–99)
Glucose-Capillary: 224 mg/dL — ABNORMAL HIGH (ref 65–99)

## 2017-10-06 LAB — HEMOGLOBIN AND HEMATOCRIT, BLOOD
HCT: 38.7 % — ABNORMAL LOW (ref 39.0–52.0)
Hemoglobin: 13 g/dL (ref 13.0–17.0)

## 2017-10-06 MED ORDER — SODIUM CHLORIDE 0.9% FLUSH
3.0000 mL | Freq: Two times a day (BID) | INTRAVENOUS | Status: DC
  Administered 2017-10-07: 3 mL via INTRAVENOUS

## 2017-10-06 MED ORDER — SODIUM CHLORIDE 0.9 % IV SOLN: 250.0000 mL | INTRAVENOUS | Status: DC | PRN

## 2017-10-06 MED ORDER — SODIUM CHLORIDE 0.9% FLUSH: 3.0000 mL | INTRAVENOUS | Status: DC | PRN

## 2017-10-06 NOTE — Progress Notes (Signed)
Urology Progress Note   1 Day Post-Op  Subjective: NAEON.  Tolerated clears. No n/v CBI off, urine clear JP with minimal output  All labs WNL.    Objective: Vital signs in last 24 hours: Temp:  [98.4 F (36.9 C)-99.6 F (37.6 C)] 99.2 F (37.3 C) (12/08 0636) Pulse Rate:  [86-110] 86 (12/08 0636) Resp:  [9-18] 16 (12/08 0636) BP: (124-173)/(72-106) 129/74 (12/08 0636) SpO2:  [97 %-100 %] 99 % (12/08 0636)  Intake/Output from previous day: 12/07 0701 - 12/08 0700 In: 6702.5 [P.O.:480; I.V.:2622.5] Out: 8830 [Urine:8500; Drains:30; Blood:300] Intake/Output this shift: No intake/output data recorded.  Physical Exam:  General: Alert and oriented CV: RRR Lungs: Clear Abdomen: Soft, appropriately tender. Incision c/d/i. JP ss GU: Foley in place draining clear yellow urine  Ext: NT, No erythema  Lab Results: Recent Labs    10/03/17 1015 10/05/17 1143 10/06/17 0456  HGB 14.9 15.0 13.0  HCT 43.6 45.4 38.7*   BMET Recent Labs    10/03/17 1015 10/06/17 0456  NA 138 138  K 4.0 4.6  CL 105 108  CO2 25 25  GLUCOSE 160* 205*  BUN 16 9  CREATININE 0.94 0.85  CALCIUM 9.5 9.1     Studies/Results: No results found.  Assessment/Plan:  66 y.o. male s/p simple prostatectomy.  Overall doing well post-op.   - regular diet - medlock - ambulate - PRN pain meds - CBI clamped  - foley in for 14 days  - JP will likely be removed prior to dC   Dispo: tomorrow, likely   I have seen the pt and agree with the above assessment and plan   LOS: 1 day   Alla Feeling, MD 10/06/2017, 7:57 AM

## 2017-10-07 LAB — GLUCOSE, CAPILLARY
GLUCOSE-CAPILLARY: 131 mg/dL — AB (ref 65–99)
GLUCOSE-CAPILLARY: 201 mg/dL — AB (ref 65–99)
Glucose-Capillary: 249 mg/dL — ABNORMAL HIGH (ref 65–99)
Glucose-Capillary: 249 mg/dL — ABNORMAL HIGH (ref 65–99)

## 2017-10-07 NOTE — Progress Notes (Signed)
Urology Progress Note   2 Days Post-Op  Subjective: NAEON.  Tolerated Regular diet  No n/v/f/c Urine light pink, CBI off  JP with 30 cc out  Ambulating well   Objective: Vital signs in last 24 hours: Temp:  [97.9 F (36.6 C)-98.4 F (36.9 C)] 98 F (36.7 C) (12/09 0448) Pulse Rate:  [73-78] 74 (12/09 0448) Resp:  [16-17] 16 (12/09 0448) BP: (116-137)/(63-83) 137/83 (12/09 0448) SpO2:  [98 %-100 %] 98 % (12/09 0448)  Intake/Output from previous day: 12/08 0701 - 12/09 0700 In: 5400 [P.O.:600] Out: 4355 [Urine:4325; Drains:30] Intake/Output this shift: No intake/output data recorded.  Physical Exam:  General: Alert and oriented CV: RRR Lungs: Clear Abdomen: Soft, minimally tender. Incision c/d/i. JP ss GU: Foley in place draining light pink urine  Ext: NT, No erythema  Lab Results: Recent Labs    10/05/17 1143 10/06/17 0456  HGB 15.0 13.0  HCT 45.4 38.7*   BMET Recent Labs    10/06/17 0456  NA 138  K 4.6  CL 108  CO2 25  GLUCOSE 205*  BUN 9  CREATININE 0.85  CALCIUM 9.1     Studies/Results: No results found.  Assessment/Plan:  66 y.o. male s/p simple prostatectomy, POD2.  Overall doing well post-op.   - regular diet - ambulate - PRN pain meds - foley in for 14 days  - JP will likely be removed prior to dC  Dispo: Tomorrow      LOS: 2 days   Alla Feeling, MD 10/07/2017, 8:42 AM  I agree with the above assessment and plan.

## 2017-10-08 LAB — GLUCOSE, CAPILLARY
Glucose-Capillary: 131 mg/dL — ABNORMAL HIGH (ref 65–99)
Glucose-Capillary: 237 mg/dL — ABNORMAL HIGH (ref 65–99)

## 2017-10-08 NOTE — Care Management Note (Signed)
Case Management Note  Patient Details  Name: Lewi Drost MRN: [id number] Date of Birth: November 01, 1950  Subjective/Objective: 66 y/o m admitted w/BPH. From home.                   Action/Plan:d/c home.   Expected Discharge Date:                  Expected Discharge Plan:  Home/Self Care  In-House Referral:     Discharge planning Services  CM Consult  Post Acute Care Choice:    Choice offered to:     DME Arranged:    DME Agency:     HH Arranged:    [place] Agency:     Status of Service:  Completed, signed off  If discussed at [employer] of Stay Meetings, dates discussed:    Additional Comments:  Dessa Phi, RN 10/08/2017, 10:53 AM

## 2017-10-17 NOTE — Discharge Summary (Signed)
Physician Discharge Summary  Patient ID: Preston Pennington MRN: [id number] DOB/AGE: 67-Aug-1952 67 y.o.  Admit date: 10/05/2017 Discharge date: 10/08/2017  Admission Diagnoses: BPH with urinary obstruction Discharge Diagnoses:  Active Problems:   BPH with urinary obstruction   Discharged Condition: good  Hospital Course: The patient tolerated the procedure well and was transferred to the floor on IV pain meds, IV fluid. On POD#1 pt was started on clear liquid diet and they ambulated in the halls. On POD#2 the patient was transitioned to a regular diet, IVFs were discontinued, and the patient passed flatus. Prior to discharge the pt was tolerating a regular diet, pain was controlled on PO pain meds, they were ambulating without difficulty, and they had normal bowel function.   Consults: None  Significant Diagnostic Studies: none  Treatments: surgery: Robotic simple prostatectomy  Discharge Exam: Blood pressure (!) 135/93, pulse 69, temperature 98.3 F (36.8 C), temperature source Oral, resp. rate 18, height '5\' 7"'  (1.702 m), weight 70.3 kg (155 lb), SpO2 99 %. General appearance: alert, cooperative and appears stated age Head: Normocephalic, without obvious abnormality, atraumatic Nose: Nares normal. Septum midline. Mucosa normal. No drainage or sinus tenderness. Neck: no adenopathy, no carotid bruit, no JVD, supple, symmetrical, trachea midline and thyroid not enlarged, symmetric, no tenderness/mass/nodules Resp: clear to auscultation bilaterally Cardio: regular rate and rhythm, S1, S2 normal, no murmur, click, rub or gallop GI: soft, non-tender; bowel sounds normal; no masses,  no organomegaly Extremities: extremities normal, atraumatic, no cyanosis or edema Skin: Skin color, texture, turgor normal. No rashes or lesions Neurologic: Grossly normal Incision/Wound: clean, dry, intact  Disposition: 01-Home or Self Care  Discharge Instructions    Discharge patient   Complete by:  As  directed    Discharge disposition:  01-Home or Self Care   Discharge patient date:  10/08/2017     Allergies as of 10/08/2017   No Known Allergies     Medication List    STOP taking these medications   alfuzosin 10 MG 24 hr tablet Commonly known as:  UROXATRAL   meloxicam 15 MG tablet Commonly known as:  MOBIC   multivitamin with minerals Tabs tablet     TAKE these medications   acetaminophen 500 MG tablet Commonly known as:  TYLENOL Take 1,000 mg by mouth every 6 (six) hours as needed for moderate pain.   allopurinol 100 MG tablet Commonly known as:  ZYLOPRIM TAKE ONE TABLET BY MOUTH ONCE DAILY   amLODipine 10 MG tablet Commonly known as:  NORVASC TAKE ONE TABLET BY MOUTH ONCE DAILY   blood glucose meter kit and supplies Kit Dispense based on patient and insurance preference. Use 2 times daily as directed. E11.9 Accu-Check Smart View Meter   COLCRYS 0.6 MG tablet Generic drug:  colchicine TAKE 2 TABLETS BY MOUTH AT THE FIRST SIGN OF GOUT THEN 1 PILL 2 HOURS LATER THEN 1 PILL DAILY UNTIL FLARE IS RESOLVED   feeding supplement (ENSURE ENLIVE) Liqd Take 1 Bottle by mouth daily.   glipiZIDE 5 MG tablet Commonly known as:  GLUCOTROL TAKE ONE TABLET BY MOUTH ONCE DAILY BEFORE BREAKFAST   glucose blood test strip Commonly known as:  ACCU-CHEK SMARTVIEW Use as instructed   HYDROcodone-acetaminophen 5-325 MG tablet Commonly known as:  NORCO Take 1-2 tablets by mouth every 6 (six) hours as needed for moderate pain or severe pain.   metFORMIN 1000 MG tablet Commonly known as:  GLUCOPHAGE TAKE 1 TABLET BY MOUTH TWICE DAILY WITH A MEAL   ONETOUCH  DELICA LANCETS FINE Misc Use to check blood sugar once a day. E11.9   predniSONE 20 MG tablet Commonly known as:  DELTASONE Take 2 pills for 3 days, 1 pill for 4 days   quinapril 20 MG tablet Commonly known as:  ACCUPRIL TAKE ONE TABLET BY MOUTH AT BEDTIME   sulfamethoxazole-trimethoprim 800-160 MG tablet Commonly  known as:  BACTRIM DS,SEPTRA DS Take 1 tablet by mouth 2 (two) times daily. Start the day prior to foley removal appointment   VICKS VAPORUB EX Apply 1 application topically at bedtime as needed (pain/ congestions).   VISINE TOTALITY MULTI-SYMPTOM OP Apply 1 drop to eye daily as needed (dry eyes).      Follow-up Information    , Candee Furbish, MD Follow up on 10/19/2017.   Specialty:  Urology Why:  at 9:30 Contact information: [address] [employer] [place] [address] [phone]           Signed: Nicolette Bang 10/17/2017, 1:31 PM

## 2017-11-05 ENCOUNTER — Ambulatory Visit: Payer: BLUE CROSS/BLUE SHIELD | Admitting: Family Medicine

## 2017-11-05 ENCOUNTER — Encounter: Payer: Self-pay | Admitting: Family Medicine

## 2017-11-05 VITALS — BP 114/68 | HR 83 | Temp 98.0°F | Ht 67.0 in | Wt 150.8 lb

## 2017-11-05 DIAGNOSIS — M5412 Radiculopathy, cervical region: Secondary | ICD-10-CM

## 2017-11-05 DIAGNOSIS — I10 Essential (primary) hypertension: Secondary | ICD-10-CM

## 2017-11-05 DIAGNOSIS — E119 Type 2 diabetes mellitus without complications: Secondary | ICD-10-CM

## 2017-11-05 DIAGNOSIS — D696 Thrombocytopenia, unspecified: Secondary | ICD-10-CM | POA: Diagnosis not present

## 2017-11-05 NOTE — Assessment & Plan Note (Signed)
On last labs slight dip down to 93- we will repeat with next labs. For now monitor only- no intervention Lab Results  Component Value Date   WBC 4.8 10/03/2017   HGB 13.0 10/06/2017   HCT 38.7 (L) 10/06/2017   MCV 78.1 10/03/2017   PLT 93 (L) 10/03/2017

## 2017-11-05 NOTE — Assessment & Plan Note (Addendum)
S: well controlled. On metformin 1g BID, glipizide 5 mg Daily. Did have prednisone given in November and luckily a1c did not spike after that. Home cbgs have ranged from 100s all the way up to 200s fasting lately.  Lab Results  Component Value Date   HGBA1C 6.1 (H) 10/03/2017   HGBA1C 6.0 07/04/2017   HGBA1C 7.6 (H) 03/29/2017   A/P: last a1c appeared to be preoperative and luckily controlled. Too early to recheck- discussed 3 month follow up. Fasting CBGs slightly higher- will still wait 14 month a1c and reevaluate. Discussed increasing glipizide to 5mg  BID but wants to hold off for now.

## 2017-11-05 NOTE — Patient Instructions (Signed)
Glad you are healing from your surgery.   Hopefully as you get more active and get your diet back to the right place- your morning sugars will look better. We can adjust medicines at next visit if need be.   Schedule a visit with Dr. Paulla Fore before you leave

## 2017-11-05 NOTE — Assessment & Plan Note (Signed)
S:  last visit had positive spurling on left - with pain/numbness/tingling into left arm and down to fingertips. We did a 7 day course of prednisone with consideration of SM visit if does not improve.   Patient has had some mild improvement but not as significant as he wouldlike A/P: he would like to proceed to see Dr. Paulla Fore and referred him today for this

## 2017-11-05 NOTE — Progress Notes (Signed)
Subjective:  Preston Pennington is a 67 y.o. year old very pleasant male patient who presents for/with See problem oriented charting ROS- healing from recent sugery- still minor abdominal pain. No fever or chills. No expanding redness near port sites. No chest pain    Past Medical History-  Patient Active Problem List   Diagnosis Date Noted  . Diabetes mellitus type II, controlled ([place]) 04/09/2013    Priority: High  . Left cervical radiculopathy 09/24/2017    Priority: Medium  . History of adenomatous polyp of colon 11/06/2016    Priority: Medium  . Chronic ankle pain 06/14/2016    Priority: Medium  . Thrombocytopenia ([place]) 11/29/2015    Priority: Medium  . Gout 04/30/2015    Priority: Medium  . Essential hypertension 04/09/2013    Priority: Medium  . Erectile dysfunction 02/04/2015    Priority: Low  . BPH (benign prostatic hyperplasia) 04/09/2013    Priority: Low  . 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 Right knee pain 11/21/2016    Medications- reviewed and updated Current Outpatient Medications  Medication Sig Dispense Refill  . acetaminophen (TYLENOL) 500 MG tablet Take 1,000 mg by mouth every 6 (six) hours as needed for moderate pain.     Marland Kitchen allopurinol (ZYLOPRIM) 100 MG tablet TAKE ONE TABLET BY MOUTH ONCE DAILY 90 tablet 3  . amLODipine (NORVASC) 10 MG tablet TAKE ONE TABLET BY MOUTH ONCE DAILY 90 tablet 3  . blood glucose meter kit and supplies KIT Dispense based on patient and insurance preference. Use 2 times daily as directed. E11.9 Accu-Check Smart View Meter 1 each 0  . Camphor-Eucalyptus-Menthol (VICKS VAPORUB EX) Apply 1 application topically at bedtime as needed (pain/ congestions).    . COLCRYS 0.6 MG tablet TAKE 2 TABLETS BY MOUTH AT THE FIRST SIGN OF GOUT THEN 1 PILL 2 HOURS LATER THEN 1 PILL DAILY UNTIL FLARE IS RESOLVED 30 tablet 5  . feeding supplement, ENSURE ENLIVE, (ENSURE ENLIVE) LIQD Take 1 Bottle by mouth daily.    Marland Kitchen glipiZIDE (GLUCOTROL) 5 MG tablet  TAKE ONE TABLET BY MOUTH ONCE DAILY BEFORE BREAKFAST 90 tablet 3  . glucose blood (ACCU-CHEK SMARTVIEW) test strip Use as instructed 100 each 12  . metFORMIN (GLUCOPHAGE) 1000 MG tablet TAKE 1 TABLET BY MOUTH TWICE DAILY WITH A MEAL 180 tablet 1  . ONETOUCH DELICA LANCETS FINE MISC Use to check blood sugar once a day. E11.9 90 each 3  . quinapril (ACCUPRIL) 20 MG tablet TAKE ONE TABLET BY MOUTH AT BEDTIME 90 tablet 1  . Tetrahyd-Glyc-Hypro-PEG-ZnSulf (VISINE TOTALITY MULTI-SYMPTOM OP) Apply 1 drop to eye daily as needed (dry eyes).     No current facility-administered medications for this visit.     Objective: BP 114/68 (BP Location: Left Arm, Patient Position: Sitting, Cuff Size: Large)   Pulse 83   Temp 98 F (36.7 C) (Oral)   Ht '5\' 7"'  (1.702 m)   Wt 150 lb 12.8 oz (68.4 kg)   SpO2 96%   BMI 23.62 kg/m  Gen: NAD, resting comfortably CV: RRR no murmurs rubs or gallops Lungs: CTAB no crackles, wheeze, rhonchi Ext: no edema Skin: warm, dry Neuro: with spurling to left reports increasing shoulder and arm pain though no paresthesias reported this visit  Assessment/Plan:  Hypertension S: controlled on amlodipine 35m and quinapril 25m  BP Readings from Last 3 Encounters:  11/05/17 114/68  10/08/17 (!) 135/93  10/03/17 127/75  A/P:  blood pressure goal of <140/90. Continue current meds: controlled  Diabetes mellitus type II, controlled ([place]) S: well controlled. On metformin 1g BID, glipizide 5 mg Daily. Did have prednisone given in November and luckily a1c did not spike after that. Home cbgs have ranged from 100s all the way up to 200s fasting lately.  Lab Results  Component Value Date   HGBA1C 6.1 (H) 10/03/2017   HGBA1C 6.0 07/04/2017   HGBA1C 7.6 (H) 03/29/2017   A/P: last a1c appeared to be preoperative and luckily controlled. Too early to recheck- discussed 3 month follow up. Fasting CBGs slightly higher- will still wait 14 month a1c and reevaluate. Discussed increasing  glipizide to 18m BID but wants to hold off for now.   Left cervical radiculopathy S:  last visit had positive spurling on left - with pain/numbness/tingling into left arm and down to fingertips. We did a 7 day course of prednisone with consideration of SM visit if does not improve.   Patient has had some mild improvement but not as significant as he wouldlike A/P: he would like to proceed to see Dr. RPaulla Foreand referred him today for this  [place]rombocyt[place] ([place] last labs slight dip down to 93- we will repeat with next labs. For now monitor only- no intervention Lab Results  Component Value Date   WBC 4.8 10/03/2017   HGB 13.0 10/06/2017   HCT 38.7 (L) 10/06/2017   MCV 78.1 10/03/2017   PLT 93 (L) 10/03/2017     Future Appointments  Date Time Provider DS[place] 02/11/2018  9:30 AM HMarin Olp MD [facility]   Return in about 14 weeks (around 02/11/2018) for follow up- or sooner if needed.  Orders Placed This Encounter  Procedures  . Ambulatory referral to Sports Medicine    Referral Priority:   Routine    Referral Type:   Consultation    Referred to Provider:   RGerda Diss DO    Number of Visits Requested:   1  . [facility] DIABETES EYE EXAM    This external order was created through the Results Console.   Return precautions advised.  SGarret Reddish MD

## 2017-11-07 ENCOUNTER — Encounter: Payer: Self-pay | Admitting: Family Medicine

## 2017-11-08 ENCOUNTER — Ambulatory Visit: Payer: BLUE CROSS/BLUE SHIELD | Admitting: Sports Medicine

## 2017-11-08 ENCOUNTER — Encounter: Payer: Self-pay | Admitting: Sports Medicine

## 2017-11-08 ENCOUNTER — Ambulatory Visit (INDEPENDENT_AMBULATORY_CARE_PROVIDER_SITE_OTHER): Payer: BLUE CROSS/BLUE SHIELD

## 2017-11-08 VITALS — BP 112/78 | HR 83 | Ht 67.0 in | Wt 151.6 lb

## 2017-11-08 DIAGNOSIS — M5412 Radiculopathy, cervical region: Secondary | ICD-10-CM

## 2017-11-08 MED ORDER — GABAPENTIN 300 MG PO CAPS: ORAL_CAPSULE | ORAL | 1 refills | Status: DC

## 2017-11-08 NOTE — Progress Notes (Signed)
[name]. , [place] at [facility] [phone]  Norbert Malkin - 67 y.o. male MRN [id number]  Date of birth: 1950/12/15  Visit Date: 11/08/2017  PCP: Marin Olp, MD   Referred by: Marin Olp, MD   Scribe for today's visit: Josepha Pigg, CMA     SUBJECTIVE:  Preston Pennington is here for LT-sided neck pain .  Referred by: Garret Reddish, MD His LT-sided neck pain symptoms INITIALLY: Began about 2 months ago and MOI is unknown. Described as severe aching and tingling, radiating to LT arm and fingers. He also feels pain in the triceps.  Worsened with hanging LT arm by his side. Pain is also worse when trying to ly on the LT side.  Improved with raising arm overhead.  Additional associated symptoms include: He has noticed some pain around the LT scapula. He denies weakness in LT arm and decreased grip strength.     At this time symptoms are worsening compared to onset  He was prescribed 7 day course of Prednisone and has been to PT. He [place] from [place] that helps to support his neck. Pt had surgery on 10/05/17 to remove his prostate so he says that he has been unable to do exercises.    ROS Reports night time disturbances. Denies fevers, chills, or night sweats. Reports unexplained weight loss since surgery in 10/05/17. Reports personal history of cancer. Denies changes in bowel or bladder habits. Denies recent unreported falls. Denies new or worsening dyspnea or wheezing. Denies headaches or dizziness.  Reports numbness, tingling or weakness  In the extremities.  Denies dizziness or presyncopal episodes Denies lower extremity edema     HISTORY & PERTINENT PRIOR DATA:  Prior History reviewed and updated per electronic medical record.  Significant history, findings, studies and interim changes include:  reports that  has never smoked. he has never used smokeless tobacco. Recent Labs    03/29/17 1050  07/04/17 0958 10/03/17 1015  HGBA1C 7.6* 6.0 6.1*  LABURIC  --  4.6  --    No specialty comments available. No problems updated.  OBJECTIVE:  VS:  HT:5\' 7"  (170.2 cm)   WT:151 lb 9.6 oz (68.8 kg)  BMI:23.74    BP:112/78  HR:83bpm  TEMP: ( )  RESP:98 %   PHYSICAL EXAM: Constitutional: WDWN, Non-toxic appearing. Psychiatric: Alert & appropriately interactive.  Not depressed or anxious appearing. Respiratory: No increased work of breathing.  Trachea Midline Eyes: Pupils are equal.  EOM intact without nystagmus.  No scleral icterus  NEUROVASCULAR exam: No clubbing or cyanosis appreciated No significant venous stasis changes Capillary Refill: normal, less than 2 seconds    UPPER Extremities  SWELLING Generalized UE Edema: No significant edema  PULSES Radial Pulses: Normal & symmetrically palpable  SENSORY Deficit noted - In the C5 and C6 dermatome on the left  MOTOR Weakness noted - With triceps extension  REFLEXES Reflexes: Abnormal reflex in - C6/C7 DTRs   Pain with brachial plexus squeeze as well as arm squeeze test on the left.  Pain with Spurling's.  Negative Lhermitte's.  Negative Hoffmann's No additional findings.   ASSESSMENT & PLAN:   1. Left cervical radiculopathy    PLAN: Given persistent ongoing symptoms and slight weakness further diagnostic evaluation with MRI indicated at this time.  We will plan to follow-up with him after this is obtained to discuss further options including potential for referral for epidural steroid injection. No problem-specific Assessment & Plan  notes found for this encounter.   ++++++++++++++++++++++++++++++++++++++++++++ Orders & Meds: Orders Placed This Encounter  Procedures  . DG Cervical Spine Complete  . MR Cervical Spine Wo Contrast    Meds ordered this encounter  Medications  . gabapentin (NEURONTIN) 300 MG capsule    Sig: Start with 1 tab po qhs X 1 week, then increase to 1 tab po bid X 1 week then 1 tab po tid prn     Dispense:  90 capsule    Refill:  1    ++++++++++++++++++++++++++++++++++++++++++++ Follow-up: Return for MRI review.   Pertinent documentation may be included in additional procedure notes, imaging studies, problem based documentation and patient instructions. Please see these sections of the encounter for additional information regarding this visit. CMA/ATC served as Education administrator during this visit. History, Physical, and Plan performed by medical provider. Documentation and orders reviewed and attested to.      Gerda Diss, [facility] Physician

## 2017-11-08 NOTE — Patient Instructions (Signed)

## 2017-11-12 ENCOUNTER — Ambulatory Visit
Admission: RE | Admit: 2017-11-12 | Discharge: 2017-11-12 | Disposition: A | Discharge status: Home / Self Care | Payer: BLUE CROSS/BLUE SHIELD | Source: Ambulatory Visit | Attending: Sports Medicine | Admitting: Sports Medicine

## 2017-11-12 DIAGNOSIS — M5412 Radiculopathy, cervical region: Secondary | ICD-10-CM

## 2017-11-14 ENCOUNTER — Telehealth: Payer: Self-pay | Admitting: Sports Medicine

## 2017-11-14 ENCOUNTER — Encounter: Payer: Self-pay | Admitting: Sports Medicine

## 2017-11-14 ENCOUNTER — Ambulatory Visit: Payer: BLUE CROSS/BLUE SHIELD | Admitting: Sports Medicine

## 2017-11-14 VITALS — BP 116/72 | HR 87 | Ht 67.0 in | Wt 151.2 lb

## 2017-11-14 DIAGNOSIS — M5412 Radiculopathy, cervical region: Secondary | ICD-10-CM | POA: Diagnosis not present

## 2017-11-14 DIAGNOSIS — M4722 Other spondylosis with radiculopathy, cervical region: Secondary | ICD-10-CM

## 2017-11-14 NOTE — Telephone Encounter (Signed)
Pt scheduled for today at 10:40.

## 2017-11-14 NOTE — Telephone Encounter (Signed)
Copied from [place] [id number]. Topic: Quick Communication - See Telephone Encounter >> Nov 14, 2017  8:47 AM Ahmed Prima L wrote: CRM for notification. See Telephone encounter for:   11/14/17.  Wants Dr Paulla Fore to call him about his MRI. He did the MRI two days ago. Call back is [phone]

## 2017-11-14 NOTE — Progress Notes (Signed)
[name]. , [place] at [facility] [phone]  Preston Pennington - 67 y.o. male MRN [id number]  Date of birth: [birth year]  Visit Date: 11/14/2017  PCP: Marin Olp, MD   Referred by: Marin Olp, MD   Scribe for today's visit: Josepha Pigg, CMA     SUBJECTIVE:  Preston Pennington is here for Follow-up (MRI review, LT cervical radiculopathy)  Compared to the last office visit, his previously described symptoms show no change  Current symptoms are moderate & are radiating to LT arm He has been takign Gabapentin 300 mg qhs with minimal relief. He will increase to BID tomorrow.  He continues to report minimal weakness on the left arm with extension.   ROS Reports night time disturbances. Denies fevers, chills, or night sweats. Denies unexplained weight loss. Reports personal history of cancer. Denies changes in bowel or bladder habits. Denies recent unreported falls. Denies new or worsening dyspnea or wheezing. Denies headaches or dizziness.  Reports numbness, tingling or weakness  In the extremities.  Denies dizziness or presyncopal episodes Denies lower extremity edema     HISTORY & PERTINENT PRIOR DATA:  Prior History reviewed and updated per electronic medical record.  Significant history, findings, studies and interim changes include:  reports that  has never smoked. he has never used smokeless tobacco. Recent Labs    03/29/17 1050 07/04/17 0958 10/03/17 1015  HGBA1C 7.6* 6.0 6.1*  LABURIC  --  4.6  --    No specialty comments available. Problem  Osteoarthritis of Spine With Radiculopathy, Cervical Region   MRI 11/13/2017: Left foraminal encroachment with mild spinal stenosis at C3-4 and C6-7 secondary to disc and osteophyte complex     OBJECTIVE:  VS:  HT:5\' 7"  (170.2 cm)   WT:151 lb 3.2 oz (68.6 kg)  BMI:23.68    BP:116/72  HR:87bpm  TEMP: ( )  RESP:97 %   PHYSICAL EXAM: Constitutional: WDWN,  Non-toxic appearing. Psychiatric: Alert & appropriately interactive.  Not depressed or anxious appearing. Respiratory: No increased work of breathing.  Trachea Midline Eyes: Pupils are equal.  EOM intact without nystagmus.  No scleral icterus  EXTREMITIES EXAM: No clubbing or cyanosis appreciated Capillary Refill is normal, less than 2 seconds  Sensation: Abnormal, see below neuro exam Strength: Weakness noted - Left triceps at 4 out of 5 weakness, normal on the right.  Otherwise normal throughout all myotomes.  No additional findings.   ASSESSMENT & PLAN:   1. Left cervical radiculopathy   2. Radiculopathy, cervical region   3. Osteoarthritis of spine with radiculopathy, cervical region    PLAN:    Reviewed MRI results in person with him today including the images.   Osteoarthritis of spine with radiculopathy, cervical region Continue titration of gabapentin Referral to Dr. Ernestina Patches for left-sided ESI Weakness has improved mildly since last office   ++++++++++++++++++++++++++++++++++++++++++++ Orders & Meds: Orders Placed This Encounter  Procedures  . Ambulatory referral to Physical Medicine Rehab    No orders of the defined types were placed in this encounter.   ++++++++++++++++++++++++++++++++++++++++++++ Follow-up: Return in about 8 weeks (around 01/09/2018).   Pertinent documentation may be included in additional procedure notes, imaging studies, problem based documentation and patient instructions. Please see these sections of the encounter for additional information regarding this visit. CMA/ATC served as Education administrator during this visit. History, Physical, and Plan performed by medical provider. Documentation and orders reviewed and attested to.      Preston Pennington  [facility] Phys[facility]

## 2017-11-14 NOTE — Telephone Encounter (Signed)
Attempted to call pt twice and both times there was ringing followed by silence. Pt will need to schedule OV with Dr Paulla Fore to discuss results and treatment recommendations.

## 2017-11-14 NOTE — Assessment & Plan Note (Signed)
Continue titration of gabapentin Referral to Dr. Ernestina Patches for left-sided ESI Weakness has improved mildly since last office

## 2017-11-16 ENCOUNTER — Encounter: Payer: Self-pay | Admitting: Sports Medicine

## 2017-11-19 ENCOUNTER — Telehealth: Payer: Self-pay | Admitting: Sports Medicine

## 2017-11-19 NOTE — Telephone Encounter (Signed)
OK to provide work note for light duty?

## 2017-11-19 NOTE — Telephone Encounter (Signed)
Copied from [place]. Topic: Inquiry >> Nov 19, 2017  2:52 PM Preston Pennington, NT wrote: Reason for CRM: patient states if he goes back to work he will need a letter stating light work only. And he is wanting to know if Teresa Coombs can write that note for him.

## 2017-11-20 ENCOUNTER — Encounter: Payer: Self-pay | Admitting: Family Medicine

## 2017-11-21 NOTE — Telephone Encounter (Signed)
Copied from [place]. Topic: General - Other >> Nov 21, 2017 10:25 AM Lolita Rieger, RMA wrote: Reason for CRM: pt called back and would like a letter to return to work on light duty

## 2017-11-21 NOTE — Telephone Encounter (Signed)
Letter provided to return to work with light duty

## 2017-11-21 NOTE — Telephone Encounter (Signed)
Please contact patient with an update.

## 2017-11-21 NOTE — Telephone Encounter (Signed)
Please contact patient with an update

## 2017-11-22 NOTE — Telephone Encounter (Signed)
Spoke with patient, he did receive letter through [employer].

## 2017-12-03 ENCOUNTER — Encounter (INDEPENDENT_AMBULATORY_CARE_PROVIDER_SITE_OTHER): Payer: Self-pay | Admitting: Physical Medicine and Rehabilitation

## 2017-12-03 ENCOUNTER — Ambulatory Visit (INDEPENDENT_AMBULATORY_CARE_PROVIDER_SITE_OTHER): Payer: BLUE CROSS/BLUE SHIELD

## 2017-12-03 ENCOUNTER — Ambulatory Visit (INDEPENDENT_AMBULATORY_CARE_PROVIDER_SITE_OTHER): Payer: BLUE CROSS/BLUE SHIELD | Admitting: Physical Medicine and Rehabilitation

## 2017-12-03 VITALS — BP 127/84 | HR 95 | Temp 97.9°F

## 2017-12-03 DIAGNOSIS — R202 Paresthesia of skin: Secondary | ICD-10-CM

## 2017-12-03 DIAGNOSIS — M5412 Radiculopathy, cervical region: Secondary | ICD-10-CM | POA: Diagnosis not present

## 2017-12-03 DIAGNOSIS — M25512 Pain in left shoulder: Secondary | ICD-10-CM

## 2017-12-03 DIAGNOSIS — M5010 Cervical disc disorder with radiculopathy, unspecified cervical region: Secondary | ICD-10-CM | POA: Diagnosis not present

## 2017-12-03 DIAGNOSIS — G8929 Other chronic pain: Secondary | ICD-10-CM | POA: Diagnosis not present

## 2017-12-03 MED ORDER — METHYLPREDNISOLONE ACETATE 80 MG/ML IJ SUSP
80.0000 mg | Freq: Once | INTRAMUSCULAR | Status: AC
  Administered 2017-12-03: 80 mg

## 2017-12-03 NOTE — Patient Instructions (Signed)

## 2017-12-03 NOTE — Progress Notes (Deleted)
Pt states pain in left arm with some tingling. Pt states pain has been going on for about 3 months. Pt states using the arm makes the pain worse, pain medication makes the pain better. +Driver,-BT,-Dye Allergies.

## 2017-12-04 ENCOUNTER — Encounter (INDEPENDENT_AMBULATORY_CARE_PROVIDER_SITE_OTHER): Payer: Self-pay | Admitting: Physical Medicine and Rehabilitation

## 2017-12-04 NOTE — Progress Notes (Signed)
Preston Pennington - 67 y.o. male MRN [id number]  Date of birth: 1951/10/24  Office Visit Note: Visit Date: 12/03/2017 PCP: Marin Olp, MD Referred by: Marin Olp, MD  Subjective: Chief Complaint  Patient presents with  . Left Arm - Pain, Tingling  . Left Shoulder - Pain  . Neck - Pain   HPI: Preston Pennington is a 67 year old right-hand-dominant gentleman who comes in today at the request of Dr. Paulla Fore for evaluation management of his neck and shoulder pain and arm pain.  Preston Pennington is present today with his son who provides some of the history.  Preston Pennington reports neck pain and pain with rotation of the neck that is been chronic for over 3 months.  Preston Pennington gets pain radiating into the arm posteriorly into the hand.  Preston Pennington does not particularly endorse any specific fingers although Preston Pennington feels like his may be more of the middle of the hand.  Preston Pennington denies any right-sided symptoms.  Preston Pennington has no associated headache.  Preston Pennington has not really noticed much in the way of weakness focally but it does feel weak.  Preston Pennington has been using some pain medication with relief.  They have tried to increase his gabapentin.  Preston Pennington has had some therapy and exercises as well as anti-inflammatories and prednisone.  Preston Pennington has been treated by Dr. Jerline Pain as well as Dr. Yong Channel and ultimately Dr. Paulla Fore.  An MRI of the cervical spine was obtained.  I denies any specific images of the shoulder.  MRI of the cervical spine is reviewed below but does show areas on the left with foraminal disc herniation worse at C3-4 with significant tightness at C6-7.  Preston Pennington does not report any specific trauma or fevers or chills or night sweats.  Preston Pennington does report some mechanical complaints of the shoulder with worsening with movement.  Patient's history complicated by diabetes.    Review of Systems  Constitutional: Negative for chills, fever, malaise/fatigue and weight loss.  HENT: Negative for hearing loss and sinus pain.   Eyes: Negative for blurred vision, double vision and photophobia.    Respiratory: Negative for cough and shortness of breath.   Cardiovascular: Negative for chest pain, palpitations and leg swelling.  Gastrointestinal: Negative for abdominal pain, nausea and vomiting.  Genitourinary: Negative for flank pain.  Musculoskeletal: Positive for joint pain and neck pain. Negative for myalgias.  Skin: Negative for itching and rash.  Neurological: Positive for tingling. Negative for tremors, focal weakness and weakness.  Endo/Heme/Allergies: Negative.   Psychiatric/Behavioral: Negative for depression.  All other systems reviewed and are negative.  Otherwise per HPI.  Assessment & Plan: Visit Diagnoses:  1. Cervical radiculopathy   2. Cervical disc disorder with radiculopathy   3. Chronic left shoulder pain   4. Paresthesia of skin     Plan: Findings:  3 months of worsening chronic neck shoulder and arm pain with some paresthesias that goes to the hand.  This seems consistent with cervical MRI showing C3-4 left foraminal and lateral disc herniation as well as C6-7 disc herniation and some central canal stenosis although fairly mild.  Either 1 of these areas could cause his symptoms.  Preston Pennington has more symptoms that she would consider C7 type patterns.  Preston Pennington also has some mechanical complaints of the shoulder itself.  Preston Pennington does get some relief with different positions but worsening with using the arm.  I think at this point is worthwhile to complete a C7-T1 interlaminar injection to the left.  We went over this at  length with the patient and his son.  They do want to proceed with that.  We did talk about increased blood sugars and how to monitor that.  Preston Pennington will continue to follow-up with Dr. Paulla Fore.  Preston Pennington may need repeat injection going forward.  I do expect that the disc herniation would resolve to a small degree and Preston Pennington may get good relief overall.  There is definitely no red flag symptoms that would warrant immediate surgical referral and the patient does not really want that.     Meds & Orders:  Meds ordered this encounter  Medications  . methylPREDNISolone acetate (DEPO-MEDROL) injection 80 mg    Orders Placed This Encounter  Procedures  . XR C-ARM NO REPORT  . Epidural Steroid injection    Follow-up: Return if symptoms worsen or fail to improve, for Dr. Paulla Fore.   Procedures: No procedures performed  Cervical Epidural Steroid Injection - Interlaminar Approach with Fluoroscopic Guidance  Patient: Preston Pennington      Date of Birth: April 09, 1951 MRN: [id number] PCP: Marin Olp, MD      Visit Date: 12/03/2017   Universal Protocol:    Date/Time: 02/05/195:45 AM  Consent Given By: the patient  Position: PRONE  Additional Comments: Vital signs were monitored before and after the procedure. Patient was prepped and draped in the usual sterile fashion. The correct patient, procedure, and site was verified.   Injection Procedure Details:  Procedure Site One Meds Administered:  Meds ordered this encounter  Medications  . methylPREDNISolone acetate (DEPO-MEDROL) injection 80 mg     Laterality: Left  Location/Site:  C7-T1  Needle size: 20 G  Needle type: Touhy  Needle Placement: Paramedian epidural space  Findings:  -Comments: Excellent flow of contrast into the epidural space.  Procedure Details: Using a paramedian approach from the side mentioned above, the region overlying the inferior lamina was localized under fluoroscopic visualization and the soft tissues overlying this structure were infiltrated with 4 ml. of 1% Lidocaine without Epinephrine. A # 20 gauge, Tuohy needle was inserted into the epidural space using a paramedian approach.  The epidural space was localized using loss of resistance along with lateral and contralateral oblique bi-planar fluoroscopic views.  After negative aspirate for air, blood, and CSF, a 2 ml. volume of Isovue-250 was injected into the epidural space and the flow of contrast was observed. Radiographs were  obtained for documentation purposes.   The injectate was administered into the level noted above.  Additional Comments:  The patient tolerated the procedure well Dressing: Band-Aid    Post-procedure details: Patient was observed during the procedure. Post-procedure instructions were reviewed.  Patient left the clinic in stable condition.   Pertinent Imaging: MRI CERVICAL SPINE WITHOUT CONTRAST  TECHNIQUE: Multiplanar, multisequence MR imaging of the cervical spine was performed. No intravenous contrast was administered.  COMPARISON:  None.  FINDINGS: Alignment: Normal  Vertebrae: Negative for fracture or mass  Cord: Normal signal and morphology  Posterior Fossa, vertebral arteries, paraspinal tissues: Negative  Disc levels:  C2-3: Negative  C3-4: Left foraminal encroachment due to moderately large disc osteophyte complex on the left. This is causing cord flattening and mild spinal stenosis  C4-5: Mild disc degeneration. Small central osteophyte without significant stenosis.  C5-6: Mild disc degeneration and mild facet degeneration without significant stenosis  C6-7: Left foraminal encroachment. Asymmetric disc and osteophyte complex on the left contributing to left foraminal encroachment. Mild right foraminal narrowing and mild spinal stenosis due to spurring  C7-T1: Negative  IMPRESSION:  Left foraminal encroachment at C3-4 due to disc and osteophyte complex. Mild spinal stenosis  Left foraminal encroachment C6-7 due to disc and osteophyte complex. Mild spinal stenosis.   Electronically Signed   By: Franchot Gallo M.D.   On: 11/13/2017 06:46    Clinical History: MRI CERVICAL SPINE WITHOUT CONTRAST  TECHNIQUE: Multiplanar, multisequence MR imaging of the cervical spine was performed. No intravenous contrast was administered.  COMPARISON:  None.  FINDINGS: Alignment: Normal  Vertebrae: Negative for fracture or  mass  Cord: Normal signal and morphology  Posterior Fossa, vertebral arteries, paraspinal tissues: Negative  Disc levels:  C2-3: Negative  C3-4: Left foraminal encroachment due to moderately large disc osteophyte complex on the left. This is causing cord flattening and mild spinal stenosis  C4-5: Mild disc degeneration. Small central osteophyte without significant stenosis.  C5-6: Mild disc degeneration and mild facet degeneration without significant stenosis  C6-7: Left foraminal encroachment. Asymmetric disc and osteophyte complex on the left contributing to left foraminal encroachment. Mild right foraminal narrowing and mild spinal stenosis due to spurring  C7-T1: Negative  IMPRESSION: Left foraminal encroachment at C3-4 due to disc and osteophyte complex. Mild spinal stenosis  Left foraminal encroachment C6-7 due to disc and osteophyte complex. Mild spinal stenosis.   Electronically Signed   By: Franchot Gallo M.D.   On: 11/13/2017 06:46  Preston Pennington reports that  has never smoked. Preston Pennington has never used smokeless tobacco.  Recent Labs    03/29/17 1050 07/04/17 0958 10/03/17 1015  HGBA1C 7.6* 6.0 6.1*  LABURIC  --  4.6  --     Objective:  VS:  HT:    WT:   BMI:     BP:127/84  HR:95bpm  TEMP:97.9 F (36.6 C)(Oral)  RESP:98 % Physical Exam  Constitutional: Preston Pennington is oriented to person, place, and time. Preston Pennington appears well-developed and well-nourished. No distress.  HENT:  Head: Normocephalic and atraumatic.  Nose: Nose normal.  Mouth/Throat: Oropharynx is clear and moist.  Eyes: Conjunctivae are normal. Pupils are equal, round, and reactive to light.  Neck: Neck supple. No tracheal deviation present.  Cardiovascular: Regular rhythm and intact distal pulses.  Pulmonary/Chest: Effort normal and breath sounds normal.  Abdominal: Soft. Preston Pennington exhibits no distension. There is no guarding.  Musculoskeletal: Preston Pennington exhibits no deformity.  Cervical exam shows a forward  flexed cervical spine.  The patient does have some tenderness in the trapezius and levator scapula with focal trigger points.  Preston Pennington does have some mechanical the left and right shoulder with external rotation more than internal rotation.  Preston Pennington has a negative Hoffman's bilaterally and Preston Pennington does seem to have decreased reflex brachioradialis on the left compared to right.  Otherwise Preston Pennington has pretty good strength bilaterally.  Neurological: Preston Pennington is alert and oriented to person, place, and time. Preston Pennington exhibits normal muscle tone. Coordination normal.  Skin: Skin is warm. No rash noted.  Psychiatric: Preston Pennington has a normal mood and affect. His behavior is normal.  Nursing note and vitals reviewed.   Ortho Exam Imaging: Xr C-arm No Report  Result Date: 12/03/2017 Please see Notes or Procedures tab for imaging impression.   Past Medical/Family/Surgical/Social History: Medications & Allergies reviewed per EMR Patient Active Problem List   Diagnosis Date Noted  . Osteoarthritis of spine with radiculopathy, cervical region 11/14/2017  . 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 Left cervical radiculopathy 09/24/2017  . Right knee pain 11/21/2016  . History of adenomatous polyp of colon 11/06/2016  . Chronic ankle pain 06/14/2016  .  Thrombocytopenia ([place]) 11/29/2015  . Gout 04/30/2015  . Erectile dysfunction 02/04/2015  . Essential hypertension 04/09/2013  . Diabetes mellitus type II, controlled ([place]) 04/09/2013  . BPH (benign prostatic hyperplasia) 04/09/2013   Past Medical History:  Diagnosis Date  . Diabetes mellitus without complication ([place])   . Difficult intubation    "mouth was not wide enough"  . Hypertension   . Prostate enlargement 2010   See's Urologist    Family History  Problem Relation Age of Onset  . Other Father        and mother- states died of old age. States no medical problems in parents or siblings  . Colon cancer Neg Hx    Past Surgical History:  Procedure Laterality Date  .  COLONOSCOPY WITH PROPOFOL N/A 11/02/2016   Procedure: COLONOSCOPY WITH PROPOFOL;  Surgeon: Gatha Mayer, MD;  Location: [facility] ENDOSCOPY;  Service: Endoscopy;  Laterality: N/A;  . LEG SURGERY Right 1997   accident related  . XI ROBOTIC ASSISTED SIMPLE PROSTATECTOMY N/A 10/05/2017   Procedure: XI ROBOTIC ASSISTED SIMPLE PROSTATECTOMY WITH UMBILICAL HERNIA REPAIR;  Surgeon: Cleon Gustin, MD;  Location: [facility] ORS;  Service: Urology;  Laterality: N/A;   Social History   Occupational History  . Not on file  Tobacco Use  . Smoking status: Never Smoker  . Smokeless tobacco: Never Used  Substance and Sexual Activity  . Alcohol use: No    Alcohol/week: 0.0 oz  . Drug use: No  . Sexual activity: Not on file

## 2017-12-04 NOTE — Procedures (Signed)
Cervical Epidural Steroid Injection - Interlaminar Approach with Fluoroscopic Guidance  Patient: Preston Pennington      Date of Birth: 1950/12/28 MRN: [id number] PCP: Marin Olp, MD      Visit Date: 12/03/2017   Universal Protocol:    Date/Time: 02/05/195:45 AM  Consent Given By: the patient  Position: PRONE  Additional Comments: Vital signs were monitored before and after the procedure. Patient was prepped and draped in the usual sterile fashion. The correct patient, procedure, and site was verified.   Injection Procedure Details:  Procedure Site One Meds Administered:  Meds ordered this encounter  Medications  . methylPREDNISolone acetate (DEPO-MEDROL) injection 80 mg     Laterality: Left  Location/Site:  C7-T1  Needle size: 20 G  Needle type: Touhy  Needle Placement: Paramedian epidural space  Findings:  -Comments: Excellent flow of contrast into the epidural space.  Procedure Details: Using a paramedian approach from the side mentioned above, the region overlying the inferior lamina was localized under fluoroscopic visualization and the soft tissues overlying this structure were infiltrated with 4 ml. of 1% Lidocaine without Epinephrine. A # 20 gauge, Tuohy needle was inserted into the epidural space using a paramedian approach.  The epidural space was localized using loss of resistance along with lateral and contralateral oblique bi-planar fluoroscopic views.  After negative aspirate for air, blood, and CSF, a 2 ml. volume of Isovue-250 was injected into the epidural space and the flow of contrast was observed. Radiographs were obtained for documentation purposes.   The injectate was administered into the level noted above.  Additional Comments:  The patient tolerated the procedure well Dressing: Band-Aid    Post-procedure details: Patient was observed during the procedure. Post-procedure instructions were reviewed.  Patient left the clinic in stable  condition.   Pertinent Imaging: MRI CERVICAL SPINE WITHOUT CONTRAST  TECHNIQUE: Multiplanar, multisequence MR imaging of the cervical spine was performed. No intravenous contrast was administered.  COMPARISON:  None.  FINDINGS: Alignment: Normal  Vertebrae: Negative for fracture or mass  Cord: Normal signal and morphology  Posterior Fossa, vertebral arteries, paraspinal tissues: Negative  Disc levels:  C2-3: Negative  C3-4: Left foraminal encroachment due to moderately large disc osteophyte complex on the left. This is causing cord flattening and mild spinal stenosis  C4-5: Mild disc degeneration. Small central osteophyte without significant stenosis.  C5-6: Mild disc degeneration and mild facet degeneration without significant stenosis  C6-7: Left foraminal encroachment. Asymmetric disc and osteophyte complex on the left contributing to left foraminal encroachment. Mild right foraminal narrowing and mild spinal stenosis due to spurring  C7-T1: Negative  IMPRESSION: Left foraminal encroachment at C3-4 due to disc and osteophyte complex. Mild spinal stenosis  Left foraminal encroachment C6-7 due to disc and osteophyte complex. Mild spinal stenosis.   Electronically Signed   By: Franchot Gallo M.D.   On: 11/13/2017 06:46

## 2017-12-19 ENCOUNTER — Encounter: Payer: Self-pay | Admitting: Sports Medicine

## 2017-12-19 ENCOUNTER — Ambulatory Visit: Payer: BLUE CROSS/BLUE SHIELD | Admitting: Sports Medicine

## 2017-12-19 VITALS — BP 136/82 | HR 78 | Ht 67.0 in | Wt 155.6 lb

## 2017-12-19 DIAGNOSIS — M4722 Other spondylosis with radiculopathy, cervical region: Secondary | ICD-10-CM | POA: Diagnosis not present

## 2017-12-19 DIAGNOSIS — M5412 Radiculopathy, cervical region: Secondary | ICD-10-CM | POA: Diagnosis not present

## 2017-12-19 MED ORDER — GABAPENTIN 300 MG PO CAPS: 300.0000 mg | ORAL_CAPSULE | Freq: Four times a day (QID) | ORAL | 1 refills | Status: DC

## 2017-12-19 NOTE — Progress Notes (Signed)
Preston Pennington. , [place] at [facility] [phone]  Jerauld Bostwick - 67 y.o. male MRN [id number]  Date of birth: October 31, 1950  Visit Date: 12/19/2017  PCP: Marin Olp, MD   Referred by: Marin Olp, MD   Scribe for today's visit: Josepha Pigg, CMA     SUBJECTIVE:  Preston Pennington is here for Follow-up (L cervical radiculopathy)  11/08/2017: His LT-sided neck pain symptoms INITIALLY: Began about 2 months ago and MOI is unknown. Described as severe aching and tingling, radiating to LT arm and fingers. He also feels pain in the triceps.  Worsened with hanging LT arm by his side. Pain is also worse when trying to ly on the LT side.  Improved with raising arm overhead.  Additional associated symptoms include: He has noticed some pain around the LT scapula. He denies weakness in LT arm and decreased grip strength.  At this time symptoms are worsening compared to onset  He was prescribed 7 day course of Prednisone and has been to PT. He [place] something from [place] that helps to support his neck. Pt had surgery on 10/05/17 to remove his prostate so he says that he has been unable to do exercises.    11/14/2017: Compared to the last office visit, his previously described symptoms show no change  Current symptoms are moderate & are radiating to LT arm He has been takign Gabapentin 300 mg qhs with minimal relief. He will increase to BID tomorrow. He continues to report minimal weakness on the left arm with extension.   12/19/2017: Compared to the last office visit, his previously described symptoms are improving, he is still having tingling in his fingers though. He also continues to have muscle pain but not as bad as prior to inj.  Current symptoms are mild & are radiating to L arm and fingers He has been taking Gabapentin 300 mg TID. He also takes Tylenol prn for pain with some relief. He had epidural inj with Dr. Ernestina Patches 12/03/17 which did  help with the pain but did not resolve it.  In the past he has tried Medrol Dosepak. MRI c-spine 11/12/17   ROS Reports night time disturbances. Denies fevers, chills, or night sweats. Denies unexplained weight loss. Reports personal history of cancer. Denies changes in bowel or bladder habits. Denies recent unreported falls. Denies new or worsening dyspnea or wheezing. Denies headaches or dizziness.  Reports numbness, tingling or weakness  In the extremities.  Denies dizziness or presyncopal episodes Denies lower extremity edema     HISTORY & PERTINENT PRIOR DATA:  Prior History reviewed and updated per electronic medical record.  Significant history, findings, studies and interim changes include:  reports that  has never smoked. he has never used smokeless tobacco. Recent Labs    03/29/17 1050 07/04/17 0958 10/03/17 1015  HGBA1C 7.6* 6.0 6.1*  LABURIC  --  4.6  --    No specialty comments available. Problem  Left Cervical Radiculopathy    OBJECTIVE:  VS:  HT:5\' 7"  (170.2 cm)   WT:155 lb 9.6 oz (70.6 kg)  BMI:24.36    BP:136/82  HR:78bpm  TEMP: ( )  RESP:98 %   PHYSICAL EXAM: Constitutional: WDWN, Non-toxic appearing. Psychiatric: Alert & appropriately interactive.  Not depressed or anxious appearing. Respiratory: No increased work of breathing.  Trachea Midline Eyes: Pupils are equal.  EOM intact without nystagmus.  No scleral icterus  NEUROVASCULAR exam: No clubbing or cyanosis appreciated No significant venous  stasis changes Capillary Refill: normal, less than 2 seconds   Bilateral upper extremities overall well aligned.  No focal weakness with dermatomal testing.  No significant pain with Spurling's compression test or with brachial plexus squeeze we does have some persistent pain with arm squeeze test.  Nondermatomal dysesthesia described as pins and needles to light touch shoulder is overall good range of motion without significant crepitation or  limitations   No additional findings.   ASSESSMENT & PLAN:   1. Osteoarthritis of spine with radiculopathy, cervical region   2. Left cervical radiculopathy   3. Radiculopathy, cervical region    PLAN:    Left cervical radiculopathy He is having persistent left-sided numbness and tingling.  He does have some mild neural tension signs or significant reflex abnormalities.  He is status post epidural steroid injection with Dr. good improvement in his pain and discomfort however he is having persistent dysesthesia there is in for him.  I would like for him to work with physical therapy as well as trying to increase his gabapentin dose to tolerability.  We will plan to check in with him in 6 weeks and keep him on light duty until that time.  If he has significant improvement in his symptoms and feels able to return to full duty sooner but I am concerned that he may have exacerbation of his symptoms with trying to lift or push objects.   ++++++++++++++++++++++++++++++++++++++++++++ Orders & Meds: Orders Placed This Encounter  Procedures  . Ambulatory referral to Physical Therapy    Meds ordered this encounter  Medications  . gabapentin (NEURONTIN) 300 MG capsule    Sig: Take 1 capsule (300 mg total) by mouth 4 (four) times daily.    Dispense:  120 capsule    Refill:  1    ++++++++++++++++++++++++++++++++++++++++++++ Follow-up: Return in about 6 weeks (around 01/30/2018).   Pertinent documentation may be included in additional procedure notes, imaging studies, problem based documentation and patient instructions. Please see these sections of the encounter for additional information regarding this visit. CMA/ATC served as Education administrator during this visit. History, Physical, and Plan performed by medical provider. Documentation and orders reviewed and attested to.      Gerda Diss, [facility] Physician

## 2017-12-19 NOTE — Assessment & Plan Note (Signed)
He is having persistent left-sided numbness and tingling.  He does have some mild neural tension signs or significant reflex abnormalities.  He is status post epidural steroid injection with Dr. good improvement in his pain and discomfort however he is having persistent dysesthesia there is in for him.  I would like for him to work with physical therapy as well as trying to increase his gabapentin dose to tolerability.  We will plan to check in with him in 6 weeks and keep him on light duty until that time.  If he has significant improvement in his symptoms and feels able to return to full duty sooner but I am concerned that he may have exacerbation of his symptoms with trying to lift or push objects.

## 2017-12-25 ENCOUNTER — Ambulatory Visit: Payer: BLUE CROSS/BLUE SHIELD | Admitting: Physical Therapy

## 2017-12-25 DIAGNOSIS — M5412 Radiculopathy, cervical region: Secondary | ICD-10-CM

## 2017-12-25 NOTE — Therapy (Signed)
[facility] [address] [place], [place], [address] Phone: [phone]   Fax:  [phone]  Physical Therapy Evaluation  Patient Details  Name: Preston Pennington MRN: [id number] Date of Birth: October 28, 1951 Referring Provider: Teresa Coombs   Encounter Date: 12/25/2017  PT End of Session - 12/25/17 0917    Visit Number  1    Number of Visits  12    Date for PT Re-Evaluation  02/05/18    PT Start Time  0842    PT Stop Time  0928    PT Time Calculation (min)  46 min    Activity Tolerance  Patient tolerated treatment well;Patient limited by pain       Past Medical History:  Diagnosis Date  . Diabetes mellitus without complication ([place])   . Difficult intubation    "mouth was not wide enough"  . Hypertension   . Prostate enlargement 2010   See's Urologist     Past Surgical History:  Procedure Laterality Date  . COLONOSCOPY WITH PROPOFOL N/A 11/02/2016   Procedure: COLONOSCOPY WITH PROPOFOL;  Surgeon: Gatha Mayer, MD;  Location: [facility];  Service: Endoscopy;  Laterality: N/A;  . LEG SURGERY Right 1997   accident related  . XI ROBOTIC ASSISTED SIMPLE PROSTATECTOMY N/A 10/05/2017   Procedure: XI ROBOTIC ASSISTED SIMPLE PROSTATECTOMY WITH UMBILICAL HERNIA REPAIR;  Surgeon: Cleon Gustin, MD;  Location: [facility];  Service: Urology;  Laterality: N/A;    There were no vitals filed for this visit.   Subjective Assessment - 12/25/17 0841    Subjective  Pt states onset of pain about 1 month ago, he has had increasing discomfort in L UE, no injury to report. He states no "pain", but uncomfortable tingling and numbness in L UE, into L fingers. He had recent injection, and states mild pain relief, but not full resolution of symptoms. 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 He was seen previously for 1 PT visit, but was unable to attend follow up sessions, due to  having prostate removal.     Limitations  Sitting;Reading;Writing;House hold activities    Patient Stated Goals  Decreased L UE tingling     Currently in Pain?  Other (Comment)    Pain Score  7     Pain Location  Arm    Pain Orientation  Left    Pain Descriptors / Indicators  Tingling    Pain Type  Acute pain    Pain Onset  More than a month ago    Pain Frequency  Intermittent    Aggravating Factors   Arm hanging at side    Pain Relieving Factors  Weight bearing through arm, or raising arm overhead         [facility] PT Assessment - 12/25/17 0001      Assessment   Medical Diagnosis  Cervica radiculopathy/L     Referring Provider  Teresa Coombs    Hand Dominance  Right      Precautions   Precautions  None      Restrictions   Weight Bearing Restrictions  No      Balance Screen   Has the patient fallen in the past 6 months  No      Prior Function   Level of Independence  Independent      Cognition   Overall Cognitive Status  Within Functional Limits for tasks assessed  Posture/Postural Control   Posture Comments  Forward head posture, rounded shoulders, (increased pain with cervical retraction)       ROM / Strength   AROM / PROM / Strength  AROM;Strength;PROM      AROM   AROM Assessment Site  Cervical    Cervical Flexion  wnl    Cervical Extension  moderate/significant limitation    Cervical - Right Side Bend  20    Cervical - Left Side Bend  20 pain    Cervical - Right Rotation  60    Cervical - Left Rotation  55 pain      PROM   Overall PROM Comments  Cervical PROM: WNL for rotation and SB;  Moderate extension loss, due to increased pain      Strength   Strength Assessment Site  Hand;Shoulder    Right/Left Shoulder  Left    Left Shoulder Flexion  4/5    Left Shoulder ABduction  4-/5    Left Shoulder Internal Rotation  4/5    Left Shoulder External Rotation  4/5    Right/Left hand  Right;Left    Right Hand Grip (lbs)  65    Left Hand Grip (lbs)  50              Objective measurements completed on examination: See above findings.      [facility] Adult PT Treatment/Exercise - 12/25/17 0001      Exercises   Exercises  Neck      Neck Exercises: Seated   Neck Retraction  20 reps    Shoulder Rolls  20 reps      Modalities   Modalities  Moist Heat      Moist Heat Therapy   Number Minutes Moist Heat  10 Minutes    Moist Heat Location  Cervical      Manual Therapy   Manual Therapy  Joint mobilization;Soft tissue mobilization;Manual Traction;Passive ROM    Passive ROM  for L shoulder, all motions, Nerve glides (for L radial and median); manual stretches for UT    Manual Traction  10sec x10             PT Education - 12/25/17 0917    Education provided  Yes    Education Details  HEP, posture , DX and PT POC     Person(s) Educated  Patient    Methods  Explanation;Demonstration;Tactile cues;Verbal cues;Handout    Comprehension  Verbalized understanding       PT Short Term Goals - 12/25/17 0918      PT SHORT TERM GOAL #1   Title  Pt to be independent with initial HEP     Time  2    Period  Weeks    Status  New    Target Date  01/08/18      PT SHORT TERM GOAL #2   Title  Pt to demo ability to independently self correct posture in clinic at least 50% of the time     Time  2    Period  Weeks    Status  New    Target Date  01/08/18        PT Long Term Goals - 12/25/17 0919      PT LONG TERM GOAL #1   Title  Pt to be demo improved AROM for c-spine to be WNL, to improve ability for driving and ADLs.     Time  6    Period  Weeks  Status  New    Target Date  02/05/18      PT LONG TERM GOAL #2   Title  Pt to report UE symptoms of tingling to be rated at 0-2/10 with resting and activity     Time  6    Period  Weeks    Status  New    Target Date  02/05/18      PT LONG TERM GOAL #3   Title  Pt to demo improved AROM of L shoulder to be [facility] , to allow for improved ADLs and IADLs.     Time  6    Period   Weeks    Status  New    Target Date  02/05/18             Plan - 12/25/17 0926    Clinical Impression Statement  Pt presents with primary complaint of increased numbness and tingling in L UE. He denies any pain in neck, but radicuar symptoms are very bothersome to him. He has decreased cervical AROM due to pain, but PROM is [facility]. He has decreased ability for shoulder AROM due to pain as well, and decreased shoulder strength for elevation. He has muslce tightness in cervical musculature and UTs, and has poor seated posture with forward head. Pt states that he has OTC/home traction device, will bring next visit for educaton on use. Pt with pain and deficits that are effecting his ability for full functional activities, driving, and work duties. Pt to benefit from skilled care to improve this and return to full duty work.     Clinical Presentation  Evolving    Clinical Decision Making  Moderate    Rehab Potential  Fair    PT Frequency  2x / week    PT Duration  6 weeks    PT Treatment/Interventions  ADLs/Self Care Home Management;Cryotherapy;Electrical Stimulation;Moist Heat;Therapeutic activities;Functional mobility training;Ultrasound;Therapeutic exercise;Traction;Balance training;Neuromuscular re-education;Patient/family education;Dry needling;Passive range of motion;Taping;Manual techniques;Manual lymph drainage       Patient will benefit from skilled therapeutic intervention in order to improve the following deficits and impairments:  Hypomobility, Impaired sensation, Decreased strength, Impaired UE functional use, Pain, Decreased range of motion, Improper body mechanics  Visit Diagnosis: Left cervical radiculopathy     Problem List Patient Active Problem List   Diagnosis Date Noted  . Osteoarthritis of spine with radiculopathy, cervical region 11/14/2017  . 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 Left cervical radiculopathy 09/24/2017  . Right knee pain 11/21/2016  .  History of adenomatous polyp of colon 11/06/2016  . Chronic ankle pain 06/14/2016  . Thrombocytopenia ([place]) 11/29/2015  . Gout 04/30/2015  . Erectile dysfunction 02/04/2015  . Essential hypertension 04/09/2013  . Diabetes mellitus type II, controlled ([place]) 04/09/2013  . BPH (benign prostatic hyperplasia) 04/09/2013   Lyndee Hensen, PT, DPT 11:23 AM  12/25/17    [facility] [place], [place], [address] Phone: [phone]   Fax:  [phone]  Name: Preston Pennington MRN: [id number] Date of Birth: 1951/05/27

## 2017-12-27 ENCOUNTER — Encounter: Payer: Self-pay | Admitting: Physical Therapy

## 2017-12-27 ENCOUNTER — Ambulatory Visit: Payer: BLUE CROSS/BLUE SHIELD | Admitting: Physical Therapy

## 2017-12-27 DIAGNOSIS — M5412 Radiculopathy, cervical region: Secondary | ICD-10-CM

## 2017-12-27 NOTE — Therapy (Signed)
[facility] [address] [place], [place], [address] Phone: [phone]   Fax:  [phone]  Physical Therapy Treatment  Patient Details  Name: Preston Pennington MRN: [id number] Date of Birth: 01/26/1951 Referring Provider: Teresa Coombs   Encounter Date: 12/27/2017  PT End of Session - 12/27/17 1140    Visit Number  2    Number of Visits  12    Date for PT Re-Evaluation  02/05/18    PT Start Time  1048    PT Stop Time  1143    PT Time Calculation (min)  55 min    Activity Tolerance  Patient tolerated treatment well;Patient limited by pain       Past Medical History:  Diagnosis Date  . Diabetes mellitus without complication ([place])   . Difficult intubation    "mouth was not wide enough"  . Hypertension   . Prostate enlargement 2010   See's Urologist     Past Surgical History:  Procedure Laterality Date  . COLONOSCOPY WITH PROPOFOL N/A 11/02/2016   Procedure: COLONOSCOPY WITH PROPOFOL;  Surgeon: Gatha Mayer, MD;  [facility]: [facility];  Service: Endoscopy;  Laterality: N/A;  . LEG SURGERY Right 1997   accident related  . XI ROBOTIC ASSISTED SIMPLE PROSTATECTOMY N/A 10/05/2017   Procedure: XI ROBOTIC ASSISTED SIMPLE PROSTATECTOMY WITH UMBILICAL HERNIA REPAIR;  Surgeon: Cleon Gustin, MD;  Location: [facility];  Service: Urology;  Laterality: N/A;    There were no vitals filed for this visit.  Subjective Assessment - 12/27/17 1139    Subjective  Pt states mild decrease in pain. He has been doing HEP. He brought in his home cervical traction- inflatable collar, with questions on use.     Currently in Pain?  Yes    Pain Location  Arm    Pain Orientation  Left    Pain Descriptors / Indicators  Sore    Pain Type  Acute pain    Pain Onset  More than a month ago    Pain Frequency  Intermittent                      [facility] Adult PT Treatment/Exercise - 12/27/17 0001      Self-Care   Self-Care  Other Self-Care  Comments    Other Self-Care Comments   Education on proper use of home traction device, inflatable collar, pt states understanding of use and precautions.       Neck Exercises: Seated   Neck Retraction  20 reps    Shoulder Rolls  20 reps      Modalities   Modalities  Moist Heat      Moist Heat Therapy   Number Minutes Moist Heat  10 Minutes    Moist Heat Location  Cervical      Manual Therapy   Manual Therapy  Joint mobilization;Soft tissue mobilization    Joint Mobilization  PA mobs c-spine, grade 3;  side glides; rotation mobs for L rotation;      Soft tissue mobilization  STM/DTM to L UT    Passive ROM  for L shoulder, all motions, Nerve glides (for L radial and median); manual stretches for UT; cervical retraction with assisted extension    Manual Traction  10sec x10             PT Education - 12/27/17 1140    Education provided  Yes    Education Details  HEP, posture, use of home cervical  inflatable collar     Person(s) Educated  Patient    Methods  Explanation    Comprehension  Verbalized understanding       PT Short Term Goals - 12/25/17 0918      PT SHORT TERM GOAL #1   Title  Pt to be independent with initial HEP     Time  2    Period  Weeks    Status  New    Target Date  01/08/18      PT SHORT TERM GOAL #2   Title  Pt to demo ability to independently self correct posture in clinic at least 50% of the time     Time  2    Period  Weeks    Status  New    Target Date  01/08/18        PT Long Term Goals - 12/25/17 0919      PT LONG TERM GOAL #1   Title  Pt to be demo improved AROM for c-spine to be WNL, to improve ability for driving and ADLs.     Time  6    Period  Weeks    Status  New    Target Date  02/05/18      PT LONG TERM GOAL #2   Title  Pt to report UE symptoms of tingling to be rated at 0-2/10 with resting and activity     Time  6    Period  Weeks    Status  New    Target Date  02/05/18      PT LONG TERM GOAL #3   Title  Pt to  demo improved AROM of L shoulder to be [facility] , to allow for improved ADLs and IADLs.     Time  6    Period  Weeks    Status  New    Target Date  02/05/18            Plan - 12/27/17 1142    Clinical Impression Statement  Pt with increased soreness and tightness at L Upper trap, addressed with STM today. He also has tingling and pain sensation in L UE, manual therapy, joint mobilization and distraction done to decrease. Pt with increased pain with L rotation, and increased UE symptoms with increased cervical retraction. Plan to progress postural ther ex as tolerated, and as pain improves.     PT Treatment/Interventions  ADLs/Self Care Home Management;Cryotherapy;Electrical Stimulation;Moist Heat;Therapeutic activities;Functional mobility training;Ultrasound;Therapeutic exercise;Traction;Balance training;Neuromuscular re-education;Patient/family education;Dry needling;Passive range of motion;Taping;Manual techniques;Manual lymph drainage       Patient will benefit from skilled therapeutic intervention in order to improve the following deficits and impairments:  Hypomobility, Impaired sensation, Decreased strength, Impaired UE functional use, Pain, Decreased range of motion, Improper body mechanics  Visit Diagnosis: Left cervical radiculopathy     Problem List Patient Active Problem List   Diagnosis Date Noted  . Osteoarthritis of spine with radiculopathy, cervical region 11/14/2017  . 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 Left cervical radiculopathy 09/24/2017  . Right knee pain 11/21/2016  . History of adenomatous polyp of colon 11/06/2016  . Chronic ankle pain 06/14/2016  . Thrombocytopenia ([place]) 11/29/2015  . Gout 04/30/2015  . Erectile dysfunction 02/04/2015  . Essential hypertension 04/09/2013  . Diabetes mellitus type II, controlled ([place]) 04/09/2013  . BPH (benign prostatic hyperplasia) 04/09/2013   Lyndee Hensen, PT, DPT 12:03 PM  12/27/17    [facility] [address]  [place], [place], [address] Phone: [phone]   Fax:  [phone]  Name: Preston Pennington MRN: [id number] Date of Birth: [birth year]

## 2018-01-01 ENCOUNTER — Encounter: Payer: Self-pay | Admitting: Physical Therapy

## 2018-01-01 ENCOUNTER — Ambulatory Visit: Payer: BLUE CROSS/BLUE SHIELD | Admitting: Physical Therapy

## 2018-01-01 DIAGNOSIS — M5412 Radiculopathy, cervical region: Secondary | ICD-10-CM | POA: Diagnosis not present

## 2018-01-01 NOTE — Therapy (Signed)
[facility] [address] [place], [place], [address] Phone: [phone]   Fax:  [phone]  Physical Therapy Treatment  Patient Details  Name: Preston Pennington MRN: [id number] Date of Birth: 06-Sep-1951 Referring Provider: Teresa Coombs   Encounter Date: 01/01/2018  PT End of Session - 01/01/18 1004    Visit Number  3    Number of Visits  12    Date for PT Re-Evaluation  02/05/18    PT Start Time  0849    PT Stop Time  0938    PT Time Calculation (min)  49 min    Activity Tolerance  Patient tolerated treatment well;Patient limited by pain       Past Medical History:  Diagnosis Date  . Diabetes mellitus without complication ([place])   . Difficult intubation    "mouth was not wide enough"  . Hypertension   . Prostate enlargement 2010   See's Urologist     Past Surgical History:  Procedure Laterality Date  . COLONOSCOPY WITH PROPOFOL N/A 11/02/2016   Procedure: COLONOSCOPY WITH PROPOFOL;  Surgeon: Gatha Mayer, MD;  Location: [facility];  Service: Endoscopy;  Laterality: N/A;  . LEG SURGERY Right 1997   accident related  . XI ROBOTIC ASSISTED SIMPLE PROSTATECTOMY N/A 10/05/2017   Procedure: XI ROBOTIC ASSISTED SIMPLE PROSTATECTOMY WITH UMBILICAL HERNIA REPAIR;  Surgeon: Cleon Gustin, MD;  Location: [facility];  Service: Urology;  Laterality: N/A;    There were no vitals filed for this visit.  Subjective Assessment - 01/01/18 0854    Subjective  Pt states improving pain, but still has mild tingling into L fingers. He states no pain at work, but is still working light duty.     Currently in Pain?  Yes    Pain Score  3     Pain Location  Arm    Pain Orientation  Left    Pain Descriptors / Indicators  Tingling;Sore    Pain Type  Acute pain    Pain Onset  More than a month ago    Pain Frequency  Intermittent                      [facility] Adult PT Treatment/Exercise - 01/01/18 0852      Self-Care   Self-Care  --     Other Self-Care Comments   --      Neck Exercises: Standing   Other Standing Exercises  Scap Squeeze x20    Other Standing Exercises  Row- RTB x20      Neck Exercises: Seated   Neck Retraction  20 reps    Cervical Rotation  15 reps    Shoulder Rolls  20 reps    Other Seated Exercise  Shoulder pulley x3 min    Other Seated Exercise  Cervical retraction with extension x15;  Thoracic extension with towel roll x20       Modalities   Modalities  Moist Heat      Moist Heat Therapy   Number Minutes Moist Heat  10 Minutes    Moist Heat Location  Cervical      Manual Therapy   Manual Therapy  Joint mobilization;Soft tissue mobilization    Joint Mobilization  PA mobs c-spine, grade 3;  side glides;     Soft tissue mobilization  --    Passive ROM  for L shoulder, all motions, Nerve glides (for L radial and median); manual stretches for UT; cervical retraction with  assisted extension    Manual Traction  10sec x10             PT Education - 01/01/18 0855    Education provided  Yes    Education Details  HEp, posture     Person(s) Educated  Patient    Methods  Explanation    Comprehension  Verbalized understanding       PT Short Term Goals - 12/25/17 0918      PT SHORT TERM GOAL #1   Title  Pt to be independent with initial HEP     Time  2    Period  Weeks    Status  New    Target Date  01/08/18      PT SHORT TERM GOAL #2   Title  Pt to demo ability to independently self correct posture in clinic at least 50% of the time     Time  2    Period  Weeks    Status  New    Target Date  01/08/18        PT Long Term Goals - 12/25/17 0919      PT LONG TERM GOAL #1   Title  Pt to be demo improved AROM for c-spine to be WNL, to improve ability for driving and ADLs.     Time  6    Period  Weeks    Status  New    Target Date  02/05/18      PT LONG TERM GOAL #2   Title  Pt to report UE symptoms of tingling to be rated at 0-2/10 with resting and activity     Time  6     Period  Weeks    Status  New    Target Date  02/05/18      PT LONG TERM GOAL #3   Title  Pt to demo improved AROM of L shoulder to be [facility] , to allow for improved ADLs and IADLs.     Time  6    Period  Weeks    Status  New    Target Date  02/05/18            Plan - 01/01/18 1006    Clinical Impression Statement  Pt states decreased pain and symptoms overall, but still has difficulty during session today with retraction and extension, both of which increase tigling into L UE. Distraction decreases tingling. Pt continues to be educated on posture, and seated posture for low back, thoracic, and neck. Pt with tendency for flexed posture. Recommend continued care.     PT Treatment/Interventions  ADLs/Self Care Home Management;Cryotherapy;Electrical Stimulation;Moist Heat;Therapeutic activities;Functional mobility training;Ultrasound;Therapeutic exercise;Traction;Balance training;Neuromuscular re-education;Patient/family education;Dry needling;Passive range of motion;Taping;Manual techniques;Manual lymph drainage       Patient will benefit from skilled therapeutic intervention in order to improve the following deficits and impairments:  Hypomobility, Impaired sensation, Decreased strength, Impaired UE functional use, Pain, Decreased range of motion, Improper body mechanics  Visit Diagnosis: Left cervical radiculopathy     Problem List Patient Active Problem List   Diagnosis Date Noted  . Osteoarthritis of spine with radiculopathy, cervical region 11/14/2017  . 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 Left cervical radiculopathy 09/24/2017  . Right knee pain 11/21/2016  . History of adenomatous polyp of colon 11/06/2016  . Chronic ankle pain 06/14/2016  . Thrombocytopenia ([place]) 11/29/2015  . Gout 04/30/2015  . Erectile dysfunction 02/04/2015  . Essential hypertension 04/09/2013  . Diabetes mellitus type  II, controlled ([place]) 04/09/2013  . BPH (benign prostatic hyperplasia)  04/09/2013   Lyndee Hensen, PT, DPT 10:33 AM  01/01/18    [facility] [place], [place], [address] Phone: [phone]   Fax:  [phone]  Name: Preston Pennington MRN: [id number] Date of Birth: [birth year]

## 2018-01-08 ENCOUNTER — Ambulatory Visit: Payer: BLUE CROSS/BLUE SHIELD | Admitting: Physical Therapy

## 2018-01-08 DIAGNOSIS — M5412 Radiculopathy, cervical region: Secondary | ICD-10-CM | POA: Diagnosis not present

## 2018-01-08 NOTE — Therapy (Signed)
[facility] [address] [place], [place], [address] Phone: [phone]   Fax:  [phone]  Physical Therapy Treatment  Patient Details  Name: Preston Pennington MRN: [id number] Date of Birth: 1951-08-20 Referring Provider: Teresa Coombs   Encounter Date: 01/08/2018  PT End of Session - 01/08/18 0945    Visit Number  4    Number of Visits  12    Date for PT Re-Evaluation  02/05/18    PT Start Time  0937    PT Stop Time  1025    PT Time Calculation (min)  48 min    Activity Tolerance  Patient tolerated treatment well;Patient limited by pain       Past Medical History:  Diagnosis Date  . Diabetes mellitus without complication ([place])   . Difficult intubation    "mouth was not wide enough"  . Hypertension   . Prostate enlargement 2010   See's Urologist     Past Surgical History:  Procedure Laterality Date  . COLONOSCOPY WITH PROPOFOL N/A 11/02/2016   Procedure: COLONOSCOPY WITH PROPOFOL;  Surgeon: Gatha Mayer, MD;  [facility];  Service: Endoscopy;  Laterality: N/A;  . LEG SURGERY Right 1997   accident related  . XI ROBOTIC ASSISTED SIMPLE PROSTATECTOMY N/A 10/05/2017   Procedure: XI ROBOTIC ASSISTED SIMPLE PROSTATECTOMY WITH UMBILICAL HERNIA REPAIR;  Surgeon: Cleon Gustin, MD;  Location: [facility];  Service: Urology;  Laterality: N/A;    There were no vitals filed for this visit.  Subjective Assessment - 01/08/18 0943    Subjective  Pt has mild soreness after sessions, then notes decreased pain. He reports decreased pain overall, less intense. He continues to have pain in tricep region and tingling into fingers.     Currently in Pain?  Yes    Pain Score  5     Pain Location  Arm    Pain Orientation  Left    Pain Descriptors / Indicators  Tingling;Sore    Pain Type  Acute pain    Pain Onset  More than a month ago    Pain Frequency  Intermittent                      [facility] Adult PT  Treatment/Exercise - 01/08/18 0949      Neck Exercises: Standing   Other Standing Exercises  Scap Squeeze x20    Other Standing Exercises  --      Neck Exercises: Seated   Neck Retraction  20 reps    Cervical Rotation  15 reps    Shoulder Rolls  --    Other Seated Exercise  Shoulder pulley x3 min    Other Seated Exercise  Cervical retraction with extension x15;  Thoracic extension with towel roll x20       Modalities   Modalities  Moist Heat      Moist Heat Therapy   Number Minutes Moist Heat  10 Minutes    Moist Heat Location  Cervical      Manual Therapy   Manual Therapy  Joint mobilization;Soft tissue mobilization    Joint Mobilization  PA mobs c-spine, grade 3;  side glides;     Soft tissue mobilization  STM/DTM to L UT    Passive ROM  for L shoulder, all motions, Nerve glides (for L radial and median); manual stretches for UT; cervical retraction with assisted extension    Manual Traction  10sec x10  PT Education - 01/08/18 0945    Education provided  Yes    Education Details  HEP    Person(s) Educated  Patient    Methods  Explanation    Comprehension  Verbalized understanding       PT Short Term Goals - 12/25/17 0918      PT SHORT TERM GOAL #1   Title  Pt to be independent with initial HEP     Time  2    Period  Weeks    Status  New    Target Date  01/08/18      PT SHORT TERM GOAL #2   Title  Pt to demo ability to independently self correct posture in clinic at least 50% of the time     Time  2    Period  Weeks    Status  New    Target Date  01/08/18        PT Long Term Goals - 12/25/17 0919      PT LONG TERM GOAL #1   Title  Pt to be demo improved AROM for c-spine to be WNL, to improve ability for driving and ADLs.     Time  6    Period  Weeks    Status  New    Target Date  02/05/18      PT LONG TERM GOAL #2   Title  Pt to report UE symptoms of tingling to be rated at 0-2/10 with resting and activity     Time  6    Period   Weeks    Status  New    Target Date  02/05/18      PT LONG TERM GOAL #3   Title  Pt to demo improved AROM of L shoulder to be [facility] , to allow for improved ADLs and IADLs.     Time  6    Period  Weeks    Status  New    Target Date  02/05/18            Plan - 01/08/18 1026    Clinical Impression Statement  Pt with mild decrease in UE symptoms with nerve glides today. He has cervical ROM that is improving for all motions, but continues to have increased radicular symptoms with retraction, extension, and scapular retraction. Pt with + response with distraction. Recommend continued care.     PT Treatment/Interventions  ADLs/Self Care Home Management;Cryotherapy;Electrical Stimulation;Moist Heat;Therapeutic activities;Functional mobility training;Ultrasound;Therapeutic exercise;Traction;Balance training;Neuromuscular re-education;Patient/family education;Dry needling;Passive range of motion;Taping;Manual techniques;Manual lymph drainage       Patient will benefit from skilled therapeutic intervention in order to improve the following deficits and impairments:  Hypomobility, Impaired sensation, Decreased strength, Impaired UE functional use, Pain, Decreased range of motion, Improper body mechanics  Visit Diagnosis: Left cervical radiculopathy     Problem List Patient Active Problem List   Diagnosis Date Noted  . Osteoarthritis of spine with radiculopathy, cervical region 11/14/2017  . 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 Left cervical radiculopathy 09/24/2017  . Right knee pain 11/21/2016  . History of adenomatous polyp of colon 11/06/2016  . Chronic ankle pain 06/14/2016  . Thrombocytopenia ([place]) 11/29/2015  . Gout 04/30/2015  . Erectile dysfunction 02/04/2015  . Essential hypertension 04/09/2013  . Diabetes mellitus type II, controlled ([place]) 04/09/2013  . BPH (benign prostatic hyperplasia) 04/09/2013   Lyndee Hensen, PT, DPT 10:29 AM  01/08/18    [facility] [place]  [place], [place], [address] Phone: [phone]   Fax:  [phone]  Name: Preston Pennington MRN: [id number] Date of Birth: Jul 04, 1951

## 2018-01-09 ENCOUNTER — Ambulatory Visit: Payer: BLUE CROSS/BLUE SHIELD | Admitting: Sports Medicine

## 2018-01-10 ENCOUNTER — Ambulatory Visit: Payer: BLUE CROSS/BLUE SHIELD | Admitting: Physical Therapy

## 2018-01-10 DIAGNOSIS — M5412 Radiculopathy, cervical region: Secondary | ICD-10-CM | POA: Diagnosis not present

## 2018-01-10 NOTE — Therapy (Signed)
[facility] [address] [place], [place], [address] Phone: [phone]   Fax:  [phone]  Physical Therapy Treatment  Patient Details  Name: Preston Pennington MRN: [id number] Date of Birth: October 29, 1951 Referring Provider: Teresa Coombs   Encounter Date: 01/10/2018  PT End of Session - 01/10/18 1101    Visit Number  5    Number of Visits  12    Date for PT Re-Evaluation  02/05/18    PT Start Time  0935    PT Stop Time  1030    PT Time Calculation (min)  55 min    Activity Tolerance  Patient tolerated treatment well;Patient limited by pain       Past Medical History:  Diagnosis Date  . Diabetes mellitus without complication ([place])   . Difficult intubation    "mouth was not wide enough"  . Hypertension   . Prostate enlargement 2010   See's Urologist     Past Surgical History:  Procedure Laterality Date  . COLONOSCOPY WITH PROPOFOL N/A 11/02/2016   Procedure: COLONOSCOPY WITH PROPOFOL;  Surgeon: Gatha Mayer, MD;  [facility];  Service: Endoscopy;  Laterality: N/A;  . LEG SURGERY Right 1997   accident related  . XI ROBOTIC ASSISTED SIMPLE PROSTATECTOMY N/A 10/05/2017   Procedure: XI ROBOTIC ASSISTED SIMPLE PROSTATECTOMY WITH UMBILICAL HERNIA REPAIR;  Surgeon: Cleon Gustin, MD;  [facility];  Service: Urology;  Laterality: N/A;    There were no vitals filed for this visit.  Subjective Assessment - 01/10/18 0937    Subjective  Pt states improving symptoms, mild sorness in arm with increased UE activity.     Currently in Pain?  Yes    Pain Score  4     Pain Location  Arm    Pain Orientation  Left    Pain Descriptors / Indicators  Tightness;Sore    Pain Type  Acute pain    Pain Onset  More than a month ago    Pain Frequency  Intermittent                      [facility] Adult PT Treatment/Exercise - 01/10/18 0940      Neck Exercises: Standing   Other Standing Exercises  Shoulder AROM: Abd with  2lb x10; Scaption 0lb x15    Other Standing Exercises  Row- GTB x20      Neck Exercises: Seated   Neck Retraction  20 reps    Cervical Rotation  --    Other Seated Exercise  Shoulder pulley x3 min    Other Seated Exercise  Cervical retraction with extension x20;  Thoracic extension with towel roll x20 ; Thoracic Rotation      Modalities   Modalities  Moist Heat      Moist Heat Therapy   Number Minutes Moist Heat  10 Minutes    Moist Heat Location  Cervical      Manual Therapy   Manual Therapy  Joint mobilization;Soft tissue mobilization    Joint Mobilization  PA mobs c-spine, grade 3;  side glides;     Soft tissue mobilization  STM/DTM to L UT    Passive ROM  for L shoulder, all motions, ; cervical retraction with assisted extension    Manual Traction  10sec x10             PT Education - 01/10/18 0938    Education provided  Yes    Education Details  HEP    Person(s) Educated  Patient    Methods  Explanation    Comprehension  Verbalized understanding       PT Short Term Goals - 12/25/17 0918      PT SHORT TERM GOAL #1   Title  Pt to be independent with initial HEP     Time  2    Period  Weeks    Status  New    Target Date  01/08/18      PT SHORT TERM GOAL #2   Title  Pt to demo ability to independently self correct posture in clinic at least 50% of the time     Time  2    Period  Weeks    Status  New    Target Date  01/08/18        PT Long Term Goals - 12/25/17 0919      PT LONG TERM GOAL #1   Title  Pt to be demo improved AROM for c-spine to be WNL, to improve ability for driving and ADLs.     Time  6    Period  Weeks    Status  New    Target Date  02/05/18      PT LONG TERM GOAL #2   Title  Pt to report UE symptoms of tingling to be rated at 0-2/10 with resting and activity     Time  6    Period  Weeks    Status  New    Target Date  02/05/18      PT LONG TERM GOAL #3   Title  Pt to demo improved AROM of L shoulder to be [facility] , to allow for  improved ADLs and IADLs.     Time  6    Period  Weeks    Status  New    Target Date  02/05/18            Plan - 01/10/18 1102    Clinical Impression Statement  Pt continues to have pain with retraction and extension, but reports less intensity. Passively, he is able to perform these motions with little/no pain. Pt with significant soreness with STM to L UT today. Pt benefitting from treatments, will benefit from continued care.     PT Treatment/Interventions  ADLs/Self Care Home Management;Cryotherapy;Electrical Stimulation;Moist Heat;Therapeutic activities;Functional mobility training;Ultrasound;Therapeutic exercise;Traction;Balance training;Neuromuscular re-education;Patient/family education;Dry needling;Passive range of motion;Taping;Manual techniques;Manual lymph drainage       Patient will benefit from skilled therapeutic intervention in order to improve the following deficits and impairments:  Hypomobility, Impaired sensation, Decreased strength, Impaired UE functional use, Pain, Decreased range of motion, Improper body mechanics  Visit Diagnosis: Left cervical radiculopathy     Problem List Patient Active Problem List   Diagnosis Date Noted  . Osteoarthritis of spine with radiculopathy, cervical region 11/14/2017  . 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 Left cervical radiculopathy 09/24/2017  . Right knee pain 11/21/2016  . History of adenomatous polyp of colon 11/06/2016  . Chronic ankle pain 06/14/2016  . Thrombocytopenia ([place]) 11/29/2015  . Gout 04/30/2015  . Erectile dysfunction 02/04/2015  . Essential hypertension 04/09/2013  . Diabetes mellitus type II, controlled ([place]) 04/09/2013  . BPH (benign prostatic hyperplasia) 04/09/2013    Lyndee Hensen, PT, DPT 11:04 AM  01/10/18    [facility] [place], [place], [address] Phone: [phone]   Fax:  [phone]  Name: Preston Pennington MRN:  [id number] Date  of Birth: 1951/03/30

## 2018-01-11 ENCOUNTER — Other Ambulatory Visit: Payer: Self-pay

## 2018-01-11 MED ORDER — GABAPENTIN 300 MG PO CAPS: 300.0000 mg | ORAL_CAPSULE | Freq: Four times a day (QID) | ORAL | 1 refills | Status: DC

## 2018-01-15 ENCOUNTER — Ambulatory Visit: Payer: BLUE CROSS/BLUE SHIELD | Admitting: Physical Therapy

## 2018-01-15 ENCOUNTER — Encounter: Payer: Self-pay | Admitting: Physical Therapy

## 2018-01-15 DIAGNOSIS — M5412 Radiculopathy, cervical region: Secondary | ICD-10-CM

## 2018-01-15 NOTE — Therapy (Signed)
[facility] [address] [place], [place], [address] Phone: [phone]   Fax:  [phone]  Physical Therapy Treatment  Patient Details  Name: Preston Pennington MRN: [id number] Date of Birth: [birth year] Referring Provider: Teresa Coombs   Encounter Date: 01/15/2018  PT End of Session - 01/15/18 1152    Visit Number  6    Number of Visits  12    Date for PT Re-Evaluation  02/05/18    PT Start Time  0932    PT Stop Time  1030    PT Time Calculation (min)  58 min    Activity Tolerance  Patient tolerated treatment well;Patient limited by pain       Past Medical History:  Diagnosis Date  . Diabetes mellitus without complication ([place])   . Difficult intubation    "mouth was not wide enough"  . Hypertension   . Prostate enlargement 2010   See's Urologist     Past Surgical History:  Procedure Laterality Date  . COLONOSCOPY WITH PROPOFOL N/A 11/02/2016   Procedure: COLONOSCOPY WITH PROPOFOL;  Surgeon: Gatha Mayer, MD;  [facility];  Service: Endoscopy;  Laterality: N/A;  . LEG SURGERY Right 1997   accident related  . XI ROBOTIC ASSISTED SIMPLE PROSTATECTOMY N/A 10/05/2017   Procedure: XI ROBOTIC ASSISTED SIMPLE PROSTATECTOMY WITH UMBILICAL HERNIA REPAIR;  Surgeon: Cleon Gustin, MD;  [facility];  Service: Urology;  Laterality: N/A;    There were no vitals filed for this visit.  Subjective Assessment - 01/15/18 1151    Subjective  Pt states decreasing pain, still feels soreness in L UT region and into L deltoid and tricep    Currently in Pain?  Yes    Pain Score  3     Pain Location  Arm    Pain Orientation  Left    Pain Descriptors / Indicators  Sore    Pain Type  Acute pain    Pain Onset  More than a month ago    Pain Frequency  Intermittent                      [facility] Adult PT Treatment/Exercise - 01/15/18 0937      Neck Exercises: Standing   Other Standing Exercises  Shoulder AROM: Abd  with 2lb x10; Scaption 2lb x20    Other Standing Exercises  Row- GTB x20      Neck Exercises: Seated   Neck Retraction  20 reps    Other Seated Exercise  Shoulder pulley x3 min    Other Seated Exercise  --      Neck Exercises: Supine   Other Supine Exercise  Shoulder AAROM, cane x20      Modalities   Modalities  Moist Heat      Moist Heat Therapy   Number Minutes Moist Heat  10 Minutes    Moist Heat Location  Cervical      Manual Therapy   Manual Therapy  Joint mobilization;Soft tissue mobilization    Joint Mobilization  PA mobs c-spine, grade 3;  side glides;     Soft tissue mobilization  STM/DTM to L UT    Passive ROM  for L shoulder, all motions, ; cervical retraction with assisted extension    Manual Traction  10sec x10             PT Education - 01/15/18 1152    Education provided  Yes    Education Details  HEP, posture,     Person(s) Educated  Patient    Methods  Explanation    Comprehension  Verbalized understanding;Need further instruction       PT Short Term Goals - 12/25/17 0918      PT SHORT TERM GOAL #1   Title  Pt to be independent with initial HEP     Time  2    Period  Weeks    Status  New    Target Date  01/08/18      PT SHORT TERM GOAL #2   Title  Pt to demo ability to independently self correct posture in clinic at least 50% of the time     Time  2    Period  Weeks    Status  New    Target Date  01/08/18        PT Long Term Goals - 12/25/17 0919      PT LONG TERM GOAL #1   Title  Pt to be demo improved AROM for c-spine to be WNL, to improve ability for driving and ADLs.     Time  6    Period  Weeks    Status  New    Target Date  02/05/18      PT LONG TERM GOAL #2   Title  Pt to report UE symptoms of tingling to be rated at 0-2/10 with resting and activity     Time  6    Period  Weeks    Status  New    Target Date  02/05/18      PT LONG TERM GOAL #3   Title  Pt to demo improved AROM of L shoulder to be [facility] , to allow for  improved ADLs and IADLs.     Time  6    Period  Weeks    Status  New    Target Date  02/05/18            Plan - 01/15/18 1153    Clinical Impression Statement  Pt with mild improvements in ability for retraction and extension ROM, with decreased pain and symptoms. He has tightness, soreness, and trigger points in L UT, addressed with DTM today. Pt responding well to treatment, Recommend continued care.     PT Treatment/Interventions  ADLs/Self Care Home Management;Cryotherapy;Electrical Stimulation;Moist Heat;Therapeutic activities;Functional mobility training;Ultrasound;Therapeutic exercise;Traction;Balance training;Neuromuscular re-education;Patient/family education;Dry needling;Passive range of motion;Taping;Manual techniques;Manual lymph drainage       Patient will benefit from skilled therapeutic intervention in order to improve the following deficits and impairments:  Hypomobility, Impaired sensation, Decreased strength, Impaired UE functional use, Pain, Decreased range of motion, Improper body mechanics  Visit Diagnosis: Left cervical radiculopathy     Problem List Patient Active Problem List   Diagnosis Date Noted  . Osteoarthritis of spine with radiculopathy, cervical region 11/14/2017  . 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 Left cervical radiculopathy 09/24/2017  . Right knee pain 11/21/2016  . History of adenomatous polyp of colon 11/06/2016  . Chronic ankle pain 06/14/2016  . Thrombocytopenia ([place]) 11/29/2015  . Gout 04/30/2015  . Erectile dysfunction 02/04/2015  . Essential hypertension 04/09/2013  . Diabetes mellitus type II, controlled ([place]) 04/09/2013  . BPH (benign prostatic hyperplasia) 04/09/2013    Lyndee Hensen, PT, DPT 11:54 AM  01/15/18    [facility] [place], [place], [address] Phone: [phone]   Fax:  [phone]  Name: Keimon Basaldua MRN: [id number] Date of Birth:  01/07/1951   

## 2018-01-17 ENCOUNTER — Ambulatory Visit: Payer: BLUE CROSS/BLUE SHIELD | Admitting: Physical Therapy

## 2018-01-17 ENCOUNTER — Encounter: Payer: Self-pay | Admitting: Physical Therapy

## 2018-01-17 DIAGNOSIS — M5412 Radiculopathy, cervical region: Secondary | ICD-10-CM

## 2018-01-18 ENCOUNTER — Other Ambulatory Visit: Payer: Self-pay

## 2018-01-18 MED ORDER — COLCHICINE 0.6 MG PO CAPS: ORAL_CAPSULE | ORAL | 3 refills | Status: DC

## 2018-01-18 NOTE — Therapy (Signed)
[facility] [address] [place], [place], [address] Phone: [phone]   Fax:  [phone]  Physical Therapy Treatment  Patient Details  Name: Preston Pennington MRN: [id number] Date of Birth: April 24, 1951 Referring Provider: Teresa Coombs   Encounter Date: 01/17/2018  PT End of Session - 01/18/18 2143    Visit Number  7    Number of Visits  12    Date for PT Re-Evaluation  02/05/18    PT Start Time  1019    PT Stop Time  1105    PT Time Calculation (min)  46 min    Activity Tolerance  Patient tolerated treatment well;Patient limited by pain       Past Medical History:  Diagnosis Date  . Diabetes mellitus without complication ([place])   . Difficult intubation    "mouth was not wide enough"  . Hypertension   . Prostate enlargement 2010   See's Urologist     Past Surgical History:  Procedure Laterality Date  . COLONOSCOPY WITH PROPOFOL N/A 11/02/2016   Procedure: COLONOSCOPY WITH PROPOFOL;  Surgeon: Gatha Mayer, MD;  Location: [facility];  Service: Endoscopy;  Laterality: N/A;  . LEG SURGERY Right 1997   accident related  . XI ROBOTIC ASSISTED SIMPLE PROSTATECTOMY N/A 10/05/2017   Procedure: XI ROBOTIC ASSISTED SIMPLE PROSTATECTOMY WITH UMBILICAL HERNIA REPAIR;  Surgeon: Cleon Gustin, MD;  Location: [facility];  Service: Urology;  Laterality: N/A;    There were no vitals filed for this visit.  Subjective Assessment - 01/18/18 2137    Subjective  Pt states improving pain. "I still feel pain, but not much". Pt with difficulty quantifying pain.     Limitations  House hold activities;Lifting    Currently in Pain?  Yes    Pain Score  2     Pain Location  Arm    Pain Orientation  Left    Pain Descriptors / Indicators  Sore    Pain Type  Acute pain    Pain Onset  More than a month ago    Pain Frequency  Intermittent                No data recorded       [facility] Adult PT Treatment/Exercise - 01/18/18 0001       Neck Exercises: Standing   Other Standing Exercises  Shoulder AROM: Abd with 2lb x10; Scaption 2lb x20    Other Standing Exercises  Row- GTB x20; Wall walks x8      Neck Exercises: Seated   Neck Retraction  20 reps    Cervical Rotation  20 reps    Other Seated Exercise  Shoulder pulley x3 min    Other Seated Exercise  Thoracic rotation x10      Neck Exercises: Supine   Other Supine Exercise  Shoulder AAROM, cane x20, 2 lb      Modalities   Modalities  Moist Heat      Moist Heat Therapy   Number Minutes Moist Heat  10 Minutes    Moist Heat Location  Cervical      Manual Therapy   Manual Therapy  Joint mobilization;Soft tissue mobilization    Joint Mobilization  PA mobs c-spine, grade 3;  side glides;     Soft tissue mobilization  STM/DTM to L UT    Passive ROM  for L shoulder, all motions, ; cervical retraction with assisted extension    Manual Traction  10sec x10  PT Education - 01/18/18 2142    Education provided  Yes    Education Details  HEP    Person(s) Educated  Patient    Methods  Explanation    Comprehension  Verbalized understanding       PT Short Term Goals - 12/25/17 0918      PT SHORT TERM GOAL #1   Title  Pt to be independent with initial HEP     Time  2    Period  Weeks    Status  New    Target Date  01/08/18      PT SHORT TERM GOAL #2   Title  Pt to demo ability to independently self correct posture in clinic at least 50% of the time     Time  2    Period  Weeks    Status  New    Target Date  01/08/18        PT Long Term Goals - 12/25/17 0919      PT LONG TERM GOAL #1   Title  Pt to be demo improved AROM for c-spine to be WNL, to improve ability for driving and ADLs.     Time  6    Period  Weeks    Status  New    Target Date  02/05/18      PT LONG TERM GOAL #2   Title  Pt to report UE symptoms of tingling to be rated at 0-2/10 with resting and activity     Time  6    Period  Weeks    Status  New    Target Date   02/05/18      PT LONG TERM GOAL #3   Title  Pt to demo improved AROM of L shoulder to be [facility] , to allow for improved ADLs and IADLs.     Time  6    Period  Weeks    Status  New    Target Date  02/05/18            Plan - 01/18/18 2138    Clinical Impression Statement  Pt with improving pain. He has improved ability for all cervical motions, including retraction and extension , but these 2 motions continue to be movements that elicit pain. He states minimal/no pain with daily activities, driving, sleeping, or work. He notes tingling in UE is much improved, but still present intermittently. He has follow up with MD next week.     PT Treatment/Interventions  ADLs/Self Care Home Management;Cryotherapy;Electrical Stimulation;Moist Heat;Therapeutic activities;Functional mobility training;Ultrasound;Therapeutic exercise;Traction;Balance training;Neuromuscular re-education;Patient/family education;Dry needling;Passive range of motion;Taping;Manual techniques;Manual lymph drainage       Patient will benefit from skilled therapeutic intervention in order to improve the following deficits and impairments:  Hypomobility, Impaired sensation, Decreased strength, Impaired UE functional use, Pain, Decreased range of motion, Improper body mechanics  Visit Diagnosis: Left cervical radiculopathy     Problem List Patient Active Problem List   Diagnosis Date Noted  . Osteoarthritis of spine with radiculopathy, cervical region 11/14/2017  . 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 Left cervical radiculopathy 09/24/2017  . Right knee pain 11/21/2016  . History of adenomatous polyp of colon 11/06/2016  . Chronic ankle pain 06/14/2016  . Thrombocytopenia ([place]) 11/29/2015  . Gout 04/30/2015  . Erectile dysfunction 02/04/2015  . Essential hypertension 04/09/2013  . Diabetes mellitus type II, controlled ([place]) 04/09/2013  . BPH (benign prostatic hyperplasia) 04/09/2013    Lyndee Hensen, PT,  DPT  9:44 PM  01/18/18   [place], [place], [address] Phone: [phone]   Fax:  [phone]  Name: Preston Pennington MRN: [id number] Date of Birth: 1951/01/01

## 2018-01-22 ENCOUNTER — Other Ambulatory Visit: Payer: Self-pay

## 2018-01-22 ENCOUNTER — Encounter: Payer: BLUE CROSS/BLUE SHIELD | Admitting: Physical Therapy

## 2018-01-22 MED ORDER — MITIGARE 0.6 MG PO CAPS: ORAL_CAPSULE | ORAL | 3 refills | Status: DC

## 2018-01-23 ENCOUNTER — Ambulatory Visit: Payer: BLUE CROSS/BLUE SHIELD | Admitting: Physical Therapy

## 2018-01-23 ENCOUNTER — Telehealth: Payer: Self-pay | Admitting: Family Medicine

## 2018-01-23 ENCOUNTER — Encounter: Payer: Self-pay | Admitting: Physical Therapy

## 2018-01-23 ENCOUNTER — Encounter: Payer: Self-pay | Admitting: Sports Medicine

## 2018-01-23 ENCOUNTER — Ambulatory Visit: Payer: BLUE CROSS/BLUE SHIELD | Admitting: Sports Medicine

## 2018-01-23 VITALS — BP 132/84 | HR 77 | Ht 67.0 in | Wt 157.8 lb

## 2018-01-23 DIAGNOSIS — M5412 Radiculopathy, cervical region: Secondary | ICD-10-CM

## 2018-01-23 DIAGNOSIS — M4722 Other spondylosis with radiculopathy, cervical region: Secondary | ICD-10-CM

## 2018-01-23 NOTE — Progress Notes (Signed)
Preston Pennington. , [place] at [facility] [phone]  Preston Pennington - 67 y.o. male MRN [id number]  Date of birth: 1951-03-06  Visit Date: 01/23/2018  PCP: Preston Olp, MD   Referred by: Preston Olp, MD  Scribe for today's visit: Preston Pennington, LAT, ATC     SUBJECTIVE:  Preston Pennington is here for Follow-up (neck pain w/ radiculopathy) .   11/08/2017: His LT-sided neck pain symptoms INITIALLY: Began about 2 months ago and MOI is unknown. Described as severe aching and tingling, radiating to LT arm and fingers. He also feels pain in the triceps.  Worsened with hanging LT arm by his side. Pain is also worse when trying to ly on the LT side.  Improved with raising arm overhead.  Additional associated symptoms include: He has noticed some pain around the LT scapula. He denies weakness in LT arm and decreased grip strength.  At this time symptoms are worsening compared to onset  He was prescribed 7 day course of Prednisone and has been to PT. He [place] something from [place] that helps to support his neck. Pt had surgery on 10/05/17 to remove his prostate so he says that he has been unable to do exercises.    12/19/2017: Compared to the last office visit, his previously described symptoms are improving, he is still having tingling in his fingers though. He also continues to have muscle pain but not as bad as prior to inj.  Current symptoms are mild & are radiating to L arm and fingers He has been taking Gabapentin 300 mg TID. He also takes Tylenol prn for pain with some relief. He had epidural inj with Dr. Ernestina Patches 12/03/17 which did help with the pain but did not resolve it.  In the past he has tried Medrol Dosepak. MRI c-spine 11/12/17  01/23/18: Compared to the last office visit on 12/19/17, his previously described neck pain symptoms are improving w/ decreased pain and decreased tingling in the L arm.  Tingling is now only in his L  fingertips. Current symptoms are mild & are radiating to the L fingertips. He has been going to PT, taking Gabapentin 300mg  TID.  He also takes Tylenol prn for pain w/ some relief.  Had an epidural w/ Dr. Ernestina Patches on 12/03/17 and had a c-spine MRI on 11/12/17.  ROS Denies night time disturbances. Denies fevers, chills, or night sweats. Denies unexplained weight loss. Reports personal history of cancer.  Beningn enlarged prostate and removed in Dec. 2018. Denies changes in bowel or bladder habits. Denies recent unreported falls. Denies new or worsening dyspnea or wheezing. Denies headaches or dizziness.  Reports numbness, tingling or weakness  In the extremities - in the L fingertips Denies dizziness or presyncopal episodes Denies lower extremity edema    HISTORY & PERTINENT PRIOR DATA:  Prior History reviewed and updated per electronic medical record.  Significant/pertinent history, findings, studies include:  reports that he has never smoked. He has never used smokeless tobacco. Recent Labs    03/29/17 1050 07/04/17 0958 10/03/17 1015  HGBA1C 7.6* 6.0 6.1*  LABURIC  --  4.6  --    No specialty comments available. Problem  Osteoarthritis of Spine With Radiculopathy, Cervical Region   MRI 11/13/2017: Left foraminal encroachment with mild spinal stenosis at C3-4 and C6-7 secondary to disc and osteophyte complex     OBJECTIVE:  VS:  HT:5\' 7"  (170.2 cm)   WT:157 lb 12.8 oz (71.6 kg)  BMI:24.71    BP:132/84  HR:77bpm  TEMP: ( )  RESP:95 %   PHYSICAL EXAM: Constitutional: WDWN, Non-toxic appearing. Psychiatric: Alert & appropriately interactive.  Not depressed or anxious appearing. Respiratory: No increased work of breathing.  Trachea Midline Eyes: Pupils are equal.  EOM intact without nystagmus.  No scleral icterus  VASCULAR: warm to touch upper extremity neuro exam: normal strength normal sensation diminished DTR in the left triceps with 1- out of 4 compared to the  rest of his upper extremities that are 2+/4.  Triceps strength is intact. Cervical range of motion has significantly improved he has limited side bending to the left but this is only by 15 degrees compared to the right.  Negative Spurling's and Lhermitte's compression test.   ASSESSMENT & PLAN:   1. Left cervical radiculopathy   2. Osteoarthritis of spine with radiculopathy, cervical region     PLAN: Okay to discontinue physical therapy at this time but he does need to continue with his home exercise program.  He does continue to have some intermittent radicular symptoms especially with terminal range of motion but this is only intermittent and immediately resolves.  Given this finding if any lack of improvement neurosurgical evaluation will be indicated.      Follow-up: Return in about 3 months (around 04/25/2018) for repeat clinical exam.   Osteoarthritis of spine with radiculopathy, cervical region He has had overall good improvement but does continue to have intermittent radicular symptoms.  His strength and his reflexes are doing well however and he would like to return to activities.  At this time no specific restrictions at work but we will plan to have him call if any exacerbation of his pain.  Can consider repeat injections versus neurosurgical evaluation if worsening symptoms.  Recheck in 3 months to ensure strength and reflexes are doing well.      Please see additional documentation for Objective, Assessment and Plan sections. Pertinent additional documentation may be included in corresponding procedure notes, imaging studies, problem based documentation and patient instructions. Please see these sections of the encounter for additional information regarding this visit.  CMA/ATC served as Education administrator during this visit. History, Physical, and Plan performed by medical provider. Documentation and orders reviewed and attested to.      Preston Pennington, [facility]  Physician

## 2018-01-23 NOTE — Telephone Encounter (Signed)
See note

## 2018-01-23 NOTE — Patient Instructions (Signed)
Please call us if you have any worsening symptoms or if you continue to have numbness following activities at work.  We are going to have you return to work without restrictions at this time.

## 2018-01-23 NOTE — Telephone Encounter (Signed)
Copied from [place] [phone]. Topic: Quick Communication - See Telephone Encounter >> Jan 23, 2018  4:39 PM Arletha Grippe wrote: CRM for notification. See Telephone encounter for: 01/23/18. [employer] called to let us know  that MITIGARE 0.6 MG CAPS is denied.  They have sent a fax that provides appeal information.   [phone]

## 2018-01-23 NOTE — Telephone Encounter (Signed)
Copied from [place]. Topic: Quick Communication - See Telephone Encounter >> Jan 23, 2018  2:45 PM Boyd Kerbs wrote: CRM for notification.  [employer] [phone]  Ref # [id number] MITIGARE 0.6 MG CAPS prescribed (can use on or after April 8th without PA) but can use colcrys now until then.  Wes is cancelling prescription PA -  Any questions please call   See Telephone encounter for: 01/23/18.

## 2018-01-23 NOTE — Assessment & Plan Note (Signed)
He has had overall good improvement but does continue to have intermittent radicular symptoms.  His strength and his reflexes are doing well however and he would like to return to activities.  At this time no specific restrictions at work but we will plan to have him call if any exacerbation of his pain.  Can consider repeat injections versus neurosurgical evaluation if worsening symptoms.  Recheck in 3 months to ensure strength and reflexes are doing well.

## 2018-01-23 NOTE — Therapy (Signed)
[facility] [address] [place], [place], [address] Phone: [phone]   Fax:  [phone]  Physical Therapy Treatment/Discharge  Patient Details  Name: Preston Pennington MRN: [id number] Date of Birth: 1951-05-15 Referring Provider: Teresa Coombs   Encounter Date: 01/23/2018  PT End of Session - 01/23/18 0951    Visit Number  8    Number of Visits  12    Date for PT Re-Evaluation  02/05/18    PT Start Time  0840    PT Stop Time  0940    PT Time Calculation (min)  60 min    Activity Tolerance  Patient tolerated treatment well;Patient limited by pain       Past Medical History:  Diagnosis Date  . Diabetes mellitus without complication ([place])   . Difficult intubation    "mouth was not wide enough"  . Hypertension   . Prostate enlargement 2010   See's Urologist     Past Surgical History:  Procedure Laterality Date  . COLONOSCOPY WITH PROPOFOL N/A 11/02/2016   Procedure: COLONOSCOPY WITH PROPOFOL;  Surgeon: Gatha Mayer, MD;  [facility];  Service: Endoscopy;  Laterality: N/A;  . LEG SURGERY Right 1997   accident related  . XI ROBOTIC ASSISTED SIMPLE PROSTATECTOMY N/A 10/05/2017   Procedure: XI ROBOTIC ASSISTED SIMPLE PROSTATECTOMY WITH UMBILICAL HERNIA REPAIR;  Surgeon: Cleon Gustin, MD;  Location: [facility];  Service: Urology;  Laterality: N/A;    There were no vitals filed for this visit.  Subjective Assessment - 01/23/18 0856    Subjective  Pt states improving pain. He is able to do regular home activities, work duties, and driving without pain or UE symptoms.     Limitations  House hold activities;Lifting    Currently in Pain?  No/denies    Pain Score  0-No pain    Pain Onset  More than a month ago         [facility] PT Assessment - 01/23/18 0001      AROM   Cervical Extension  mild deficit, mild pain    Cervical - Right Rotation  60    Cervical - Left Rotation  60, increased pain in UE      Strength   Left Shoulder Flexion  4+/5    Left Shoulder ABduction  4+/5    Left Shoulder Internal Rotation  4+/5    Left Shoulder External Rotation  4+/5    Right Hand Grip (lbs)  50    Left Hand Grip (lbs)  40            No data recorded       [facility] Adult PT Treatment/Exercise - 01/23/18 0906      Neck Exercises: Standing   Other Standing Exercises  --    Other Standing Exercises  Row- GTB x20; Wall slides x20      Neck Exercises: Seated   Neck Retraction  10 reps    Cervical Rotation  20 reps    Other Seated Exercise  Shoulder pulley x3 min    Other Seated Exercise  Thoracic rotation x10      Neck Exercises: Supine   Other Supine Exercise  Shoulder AAROM, cane x20, 2 lb      Modalities   Modalities  Moist Heat      Moist Heat Therapy   Number Minutes Moist Heat  10 Minutes    Moist Heat Location  Cervical      Manual Therapy  Manual Therapy  Joint mobilization;Soft tissue mobilization    Joint Mobilization  PA mobs c-spine, grade 3;  side glides;     Soft tissue mobilization  STM/DTM to L UT    Passive ROM  for L shoulder, all motions, ; cervical PROM    Manual Traction  10sec x10             PT Education - 01/23/18 0857    Education provided  Yes    Education Details  HEP, PT plan     Person(s) Educated  Patient    Methods  Explanation    Comprehension  Verbalized understanding       PT Short Term Goals - 01/23/18 0951      PT SHORT TERM GOAL #1   Title  Pt to be independent with initial HEP     Time  2    Period  Weeks    Status  Achieved      PT SHORT TERM GOAL #2   Title  Pt to demo ability to independently self correct posture in clinic at least 50% of the time     Time  2    Period  Weeks    Status  Achieved        PT Long Term Goals - 01/23/18 9735      PT LONG TERM GOAL #1   Title  Pt to be demo improved AROM for c-spine to be WNL, to improve ability for driving and ADLs.     Time  6    Period  Weeks    Status  Achieved       PT LONG TERM GOAL #2   Title  Pt to report UE symptoms of tingling to be rated at 0-2/10 with resting and activity     Time  6    Period  Weeks    Status  Partially Met      PT LONG TERM GOAL #3   Title  Pt to demo improved AROM of L shoulder to be [facility] , to allow for improved ADLs and IADLs.     Time  6    Period  Weeks    Status  Achieved            Plan - 01/23/18 1153    Clinical Impression Statement  Pt has been seen for 8 visits. He describes significant less pain and tingling into UE. He has symptoms with cervical retraction and increased UE activity, that are intermittent. He has improved cervical ROM, much improved for retraction and extension, he continues to have soreness with L rotation. He has demonstrated independence with final HEP, and will continue to use cervical traction at home. He is able to perform all ADLS, and work duties, as well as driving, without pain. Pt has met goals for PT at this time, and is ready for d/c. Pt will follow up wiht MD if symptoms worsen.     PT Treatment/Interventions  ADLs/Self Care Home Management;Cryotherapy;Electrical Stimulation;Moist Heat;Therapeutic activities;Functional mobility training;Ultrasound;Therapeutic exercise;Traction;Balance training;Neuromuscular re-education;Patient/family education;Dry needling;Passive range of motion;Taping;Manual techniques;Manual lymph drainage       Patient will benefit from skilled therapeutic intervention in order to improve the following deficits and impairments:  Hypomobility, Impaired sensation, Decreased strength, Impaired UE functional use, Pain, Decreased range of motion, Improper body mechanics  Visit Diagnosis: Left cervical radiculopathy     Problem List Patient Active Problem List   Diagnosis Date Noted  . Osteoarthritis of spine with radiculopathy,  cervical region 11/14/2017  . 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 Left cervical radiculopathy 09/24/2017  . Right knee pain  11/21/2016  . History of adenomatous polyp of colon 11/06/2016  . Chronic ankle pain 06/14/2016  . Thrombocytopenia ([place]) 11/29/2015  . Gout 04/30/2015  . Erectile dysfunction 02/04/2015  . Essential hypertension 04/09/2013  . Diabetes mellitus type II, controlled ([place]) 04/09/2013  . BPH (benign prostatic hyperplasia) 04/09/2013   Lyndee Hensen, PT, DPT 11:56 AM  01/23/18    [facility] [place], [place], [address] Phone: [phone]   Fax:  [phone]  Name: Preston Pennington MRN: [id number] Date of Birth: 23-Jul-1951   PHYSICAL THERAPY DISCHARGE SUMMARY  Visits from Start of Care: 8  Plan: Patient agrees to discharge.  Patient goals were met. Patient is being discharged due to meeting the stated rehab goals.  ?????       Lyndee Hensen, PT, DPT 11:57 AM  01/23/18

## 2018-01-24 NOTE — Telephone Encounter (Signed)
noted 

## 2018-01-28 ENCOUNTER — Telehealth: Payer: Self-pay | Admitting: Family Medicine

## 2018-01-28 NOTE — Telephone Encounter (Signed)
See pt. Request. Thanks. 

## 2018-01-28 NOTE — Telephone Encounter (Signed)
Copied from [place] [phone] [id number]. Topic: Quick Communication - Rx Refill/Question >> Jan 28, 2018 11:16 AM Oliver Pila B wrote: Medication: MITIGARE 0.6 MG CAPS [[id number]]   Pt called and states his insurance is not going to cover this medication and he is needing an alternative medication, call pt to advise

## 2018-01-28 NOTE — Telephone Encounter (Signed)
Check with insurance about alternates- I placed this on Preston Pennington's desk as well as received referral  Sometimes they will cover other forms of colchicine such as generic colchicine or colcrys.

## 2018-01-29 NOTE — Telephone Encounter (Signed)
From phone encounter dated 01/23/18:  Copied from [place]. Topic: Quick Communication - See Telephone Encounter >> Jan 23, 2018  2:45 PM Boyd Kerbs wrote: CRM for notification.  [employer] [phone]  Ref # [id number] MITIGARE 0.6 MG CAPS prescribed (can use on or after April 8th without PA) but can use colcrys now until then.  Wes is cancelling prescription PA -  Any questions please call   See Telephone encounter for: 01/23/18.   Insurance will pay for med after February 04, 2018

## 2018-02-11 ENCOUNTER — Ambulatory Visit: Payer: BLUE CROSS/BLUE SHIELD | Admitting: Family Medicine

## 2018-02-12 ENCOUNTER — Other Ambulatory Visit: Payer: Self-pay | Admitting: Family Medicine

## 2018-03-08 ENCOUNTER — Ambulatory Visit: Payer: BLUE CROSS/BLUE SHIELD | Admitting: Family Medicine

## 2018-03-08 ENCOUNTER — Encounter: Payer: Self-pay | Admitting: Family Medicine

## 2018-03-08 VITALS — BP 130/80 | HR 82 | Ht 67.0 in | Wt 158.0 lb

## 2018-03-08 DIAGNOSIS — E119 Type 2 diabetes mellitus without complications: Secondary | ICD-10-CM | POA: Diagnosis not present

## 2018-03-08 DIAGNOSIS — I10 Essential (primary) hypertension: Secondary | ICD-10-CM

## 2018-03-08 DIAGNOSIS — M5412 Radiculopathy, cervical region: Secondary | ICD-10-CM

## 2018-03-08 DIAGNOSIS — M1A09X0 Idiopathic chronic gout, multiple sites, without tophus (tophi): Secondary | ICD-10-CM

## 2018-03-08 DIAGNOSIS — D696 Thrombocytopenia, unspecified: Secondary | ICD-10-CM

## 2018-03-08 LAB — CBC
HEMATOCRIT: 42.2 % (ref 39.0–52.0)
Hemoglobin: 13.7 g/dL (ref 13.0–17.0)
MCHC: 32.5 g/dL (ref 30.0–36.0)
MCV: 80.9 fl (ref 78.0–100.0)
PLATELETS: 111 10*3/uL — AB (ref 150.0–400.0)
RBC: 5.22 Mil/uL (ref 4.22–5.81)
RDW: 14.2 % (ref 11.5–15.5)
WBC: 3.8 10*3/uL — ABNORMAL LOW (ref 4.0–10.5)

## 2018-03-08 LAB — COMPREHENSIVE METABOLIC PANEL
ALBUMIN: 4 g/dL (ref 3.5–5.2)
ALT: 16 U/L (ref 0–53)
AST: 14 U/L (ref 0–37)
Alkaline Phosphatase: 65 U/L (ref 39–117)
BUN: 12 mg/dL (ref 6–23)
CALCIUM: 9.3 mg/dL (ref 8.4–10.5)
CHLORIDE: 108 meq/L (ref 96–112)
CO2: 25 meq/L (ref 19–32)
CREATININE: 0.84 mg/dL (ref 0.40–1.50)
GFR: 117 mL/min (ref >60.00)
Glucose, Bld: 123 mg/dL — ABNORMAL HIGH (ref 70–99)
Potassium: 4.3 mEq/L (ref 3.5–5.1)
Sodium: 141 mEq/L (ref 135–145)
Total Bilirubin: 0.4 mg/dL (ref 0.2–1.2)
Total Protein: 6.7 g/dL (ref 6.0–8.3)

## 2018-03-08 LAB — LDL CHOLESTEROL, DIRECT: LDL DIRECT: 96 mg/dL

## 2018-03-08 LAB — HEMOGLOBIN A1C: HEMOGLOBIN A1C: 6.2 % (ref 4.6–6.5)

## 2018-03-08 NOTE — Progress Notes (Signed)
Your hemoglobin A1c looks great once again at 6.2 which is below goal of 7 Your CBC showed slightly low platelets which you have had since we have been seeing you for years ago.  You also had slightly low white blood cell count/infection fighting cells- we see this intermittently on your labs.  We will plan on rechecking at next visit and only pursue further work-up if it continues to drop- but usually it pops right back up on recheck Your CMET was normal (kidney, liver, and electrolytes, blood sugar) for someone with diabetes Your cholesterol remains above goal of 70 for someone with diabetes.  If it is above this goal we recommend a once a week cholesterol medicine at minimum.  Team-could you please send in atorvastatin 10 mg weekly #13 with 3 refills-this will last him a year.  Usually very low side effect profile with once a week cholesterol medicine.  Pick a day it is easy for you to remember.

## 2018-03-08 NOTE — Assessment & Plan Note (Signed)
S: Previously controlled on metformin 1 g twice daily, glipizide 5 mg daily. CBGs-  blood sugar around 150 in the morning- running somewhat higher. Up from 140 last visit Lab Results  Component Value Date   HGBA1C 6.1 (H) 10/03/2017   HGBA1C 6.0 07/04/2017   HGBA1C 7.6 (H) 03/29/2017  A/P: update a1c. Will update LDL and if over 70 consider once a week cholesterol medicine perhaps atorvastatin 10mg  once a week- he agrees to this

## 2018-03-08 NOTE — Assessment & Plan Note (Signed)
S: still getting flares on allopurinol 100 mg- does have one currently at IP joing of great toe right foot. Black eyed beans seem to flare this up still. Colchicine does help him a fair amount.   He states colchicine is not covered- mitigare is but we sent that in already Lab Results  Component Value Date   LABURIC 4.6 07/04/2017  A/P: we will see if we can find cheaper option - perhaps he needs to be on colcrys- calling pharmacy- they state mitigare covered at his pharmacy for $20 per fill. Hold off on uric acid as currently on flare.

## 2018-03-08 NOTE — Progress Notes (Signed)
Subjective:  Preston Pennington is a 67 y.o. year old very pleasant male patient who presents for/with See problem oriented charting ROS- pain in right great IP joint. No hypoglycemia. No chest pain or shortness of breath reported   Past Medical History-  Patient Active Problem List   Diagnosis Date Noted  . Diabetes mellitus type II, controlled ([place]) 04/09/2013    Priority: High  . Left cervical radiculopathy 09/24/2017    Priority: Medium  . History of adenomatous polyp of colon 11/06/2016    Priority: Medium  . Chronic ankle pain 06/14/2016    Priority: Medium  . Thrombocytopenia ([place]) 11/29/2015    Priority: Medium  . Gout 04/30/2015    Priority: Medium  . Essential hypertension 04/09/2013    Priority: Medium  . Osteoarthritis of spine with radiculopathy, cervical region 11/14/2017    Priority: Low  . Right knee pain 11/21/2016    Priority: Low  . Erectile dysfunction 02/04/2015    Priority: Low  . BPH (benign prostatic hyperplasia) 04/09/2013    Priority: Low    Medications- reviewed and updated Current Outpatient Medications  Medication Sig Dispense Refill  . acetaminophen (TYLENOL) 500 MG tablet Take 1,000 mg by mouth every 6 (six) hours as needed for moderate pain.     Marland Kitchen allopurinol (ZYLOPRIM) 100 MG tablet TAKE ONE TABLET BY MOUTH ONCE DAILY 90 tablet 3  . amLODipine (NORVASC) 10 MG tablet TAKE ONE TABLET BY MOUTH ONCE DAILY 90 tablet 3  . blood glucose meter kit and supplies KIT Dispense based on patient and insurance preference. Use 2 times daily as directed. E11.9 Accu-Check Smart View Meter 1 each 0  . Camphor-Eucalyptus-Menthol (VICKS VAPORUB EX) Apply 1 application topically at bedtime as needed (pain/ congestions).    . feeding supplement, ENSURE ENLIVE, (ENSURE ENLIVE) LIQD Take 1 Bottle by mouth daily.    Marland Kitchen gabapentin (NEURONTIN) 300 MG capsule Take 1 capsule (300 mg total) by mouth 4 (four) times daily. 120 capsule 1  . glipiZIDE (GLUCOTROL) 5 MG tablet TAKE ONE  TABLET BY MOUTH ONCE DAILY BEFORE BREAKFAST 90 tablet 3  . glucose blood (ACCU-CHEK SMARTVIEW) test strip Use as instructed 100 each 12  . metFORMIN (GLUCOPHAGE) 1000 MG tablet TAKE 1 TABLET BY MOUTH TWICE DAILY WITH A MEAL 180 tablet 1  . MITIGARE 0.6 MG CAPS TAKE 2 TABLETS BY MOUTH AT THE FIRST SIGN OF GOUT THEN 1 PILL 2 HOURS LATER THEN 1 PILL DAILY UNTIL FLARE IS RESOLVED 30 capsule 3  . ONETOUCH DELICA LANCETS FINE MISC Use to check blood sugar once a day. E11.9 90 each 3  . quinapril (ACCUPRIL) 20 MG tablet TAKE 1 TABLET BY MOUTH AT BEDTIME 90 tablet 1  . Tetrahyd-Glyc-Hypro-PEG-ZnSulf (VISINE TOTALITY MULTI-SYMPTOM OP) Apply 1 drop to eye daily as needed (dry eyes).     No current facility-administered medications for this visit.     Objective: BP 130/80 (BP Location: Left Arm, Patient Position: Sitting, Cuff Size: Normal)   Pulse 82   Ht '5\' 7"'  (1.702 m)   Wt 158 lb (71.7 kg)   SpO2 96%   BMI 24.75 kg/m  Gen: NAD, resting comfortably CV: RRR no murmurs rubs or gallops Lungs: CTAB no crackles, wheeze, rhonchi Abdomen: soft/nontender/nondistended/normal bowel sounds.  Ext: no edema left leg, chronically slightly larer right ankle from prior ankle injury. Right great toe at IP joint- mild erythema, warmth, pain to palpation Skin: warm, dry MSK: raised platform right shoe due to leg length discrepancy  Assessment/Plan:  Other notes: 1.  Referred to Dr. Paulla Fore last visit for potential left cervical radiculopathy not responsive to oral prednisone.  Patient had an epidural injection with Dr. Ernestina Patches at [place] [facility].  He has been taking gabapentin.  Plan was for 81-monthfollow-up from March.  He is already scheduled for that 2. Monitor cbc- mild thrombocytopenia  Hypertension  s: controlled on amlodipine 10 mg and quinapril 20 mg BP Readings from Last 3 Encounters:  03/08/18 130/80  01/23/18 132/84  12/19/17 136/82  A/P: We discussed blood pressure goal of <140/90.  Continue current meds   Diabetes mellitus type II, controlled ([place] S: Previously controlled on metformin 1 g twice daily, glipizide 5 mg daily. CBGs-  blood sugar around 150 in the morning- running somewhat higher. Up from 140 last visit Lab Results  Component Value Date   HGBA1C 6.1 (H) 10/03/2017   HGBA1C 6.0 07/04/2017   HGBA1C 7.6 (H) 03/29/2017  A/P: update a1c. Will update LDL and if over 70 consider once a week cholesterol medicine perhaps atorvastatin 141monce a week- he agrees to this  Gout S: still getting flares on allopurinol 100 mg- does have one currently at IP joing of great toe right foot. Black eyed beans seem to flare this up still. Colchicine does help him a fair amount.   He states colchicine is not covered- mitigare is but we sent that in already Lab Results  Component Value Date   LABURIC 4.6 07/04/2017  A/P: we will see if we can find cheaper option - perhaps he needs to be on colcrys- calling pharmacy- they state mitigare covered at his pharmacy for $20 per fill. Hold off on uric acid as currently on flare.  usually flares once a month- with how good uric acid looks- possible not related to gout.   Future Appointments  Date Time Provider DeMondamin6/27/2019 10:00 AM RiGerda DissDO [facility]   Return in about 4 months (around 07/09/2018).  Lab/Order associations: Controlled type 2 diabetes mellitus without complication, without long-term current use of insulin ([place]- Plan: CBC, Comprehensive metabolic panel, Hemoglobin A1c, LDL cholesterol, direct  Return precautions advised.  StGarret ReddishMD

## 2018-03-08 NOTE — Patient Instructions (Addendum)
Please stop by lab before you go  Cut out the foods that trigger the gout. Lets check in 4 months from now to check in on the gout and diabetes since they dont seem to be doing quite as well

## 2018-03-23 ENCOUNTER — Other Ambulatory Visit: Payer: Self-pay | Admitting: Family Medicine

## 2018-04-02 ENCOUNTER — Other Ambulatory Visit: Payer: Self-pay | Admitting: Sports Medicine

## 2018-04-25 ENCOUNTER — Encounter: Payer: Self-pay | Admitting: Sports Medicine

## 2018-04-25 ENCOUNTER — Ambulatory Visit: Payer: BLUE CROSS/BLUE SHIELD | Admitting: Sports Medicine

## 2018-04-25 VITALS — BP 108/68 | HR 92 | Ht 67.0 in | Wt 157.0 lb

## 2018-04-25 DIAGNOSIS — M5412 Radiculopathy, cervical region: Secondary | ICD-10-CM | POA: Diagnosis not present

## 2018-04-25 DIAGNOSIS — M4722 Other spondylosis with radiculopathy, cervical region: Secondary | ICD-10-CM | POA: Diagnosis not present

## 2018-04-25 MED ORDER — GABAPENTIN 300 MG PO CAPS: 300.0000 mg | ORAL_CAPSULE | Freq: Four times a day (QID) | ORAL | 3 refills | Status: DC

## 2018-04-25 NOTE — Progress Notes (Signed)
[name]. , [place] at [facility] [phone]  Dior Stepter - 67 y.o. male MRN [id number]  Date of birth: 07/13/1951  Visit Date: 04/25/2018  PCP: Marin Olp, MD   Referred by: Marin Olp, MD  Scribe(s) for today's visit: Wendy Poet, LAT, ATC  SUBJECTIVE:  Preston Pennington is here for Follow-up (neck pain w/ L UE radiculopathy) .    11/08/2017: His LT-sided neck pain symptoms INITIALLY: Began about 2 months ago and MOI is unknown. Described as severe aching and tingling, radiating to LT arm and fingers. He also feels pain in the triceps.  Worsened with hanging LT arm by his side. Pain is also worse when trying to ly on the LT side.  Improved with raising arm overhead.  Additional associated symptoms include: He has noticed some pain around the LT scapula. He denies weakness in LT arm and decreased grip strength.  At this time symptoms are worsening compared to onset  He was prescribed 7 day course of Prednisone and has been to PT. He [place] something from [place] that helps to support his neck. Pt had surgery on 10/05/17 to remove his prostate so he says that he has been unable to do exercises.    12/19/2017: Compared to the last office visit, his previously described symptoms are improving, he is still having tingling in his fingers though. He also continues to have muscle pain but not as bad as prior to inj.  Current symptoms are mild & are radiating to L arm and fingers He has been taking Gabapentin 300 mg TID. He also takes Tylenol prn for pain with some relief. He had epidural inj with Dr. Ernestina Patches 12/03/17 which did help with the pain but did not resolve it.  In the past he has tried Medrol Dosepak. MRI c-spine 11/12/17  01/23/18: Compared to the last office visit on 12/19/17, his previously described neck pain symptoms are improving w/ decreased pain and decreased tingling in the L arm.  Tingling is now only in his L  fingertips. Current symptoms are mild & are radiating to the L fingertips. He has been going to PT, taking Gabapentin 300mg  TID.  He also takes Tylenol prn for pain w/ some relief.  Had an epidural w/ Dr. Ernestina Patches on 12/03/17 and had a c-spine MRI on 11/12/17.  04/25/18: Compared to the last office visit on 01/23/18, his previously described neck pain symptoms are improving w/ much less pain.  The majority of his symptoms are in his L upper arm/tricep area. Current symptoms are mild & are radiating to the L upper arm.  He is no longer having any N/T into his L UE. He has been taking Gabapentin 300 mg TID and Tylenol.  He had an epidural w/ Dr. Ernestina Patches on 12/03/17 and a c-spine MRI on 11/12/17.   REVIEW OF SYSTEMS: Denies night time disturbances. Denies fevers, chills, or night sweats. Denies unexplained weight loss. Reports personal history of cancer. Diagnosed w/ benign enlarged prostate that was removed in Dec. 2018. Denies changes in bowel or bladder habits. Denies recent unreported falls. Denies new or worsening dyspnea or wheezing. Denies headaches or dizziness.  Denies numbness, tingling or weakness  In the extremities.  Denies dizziness or presyncopal episodes Denies lower extremity edema    HISTORY & PERTINENT PRIOR DATA:  Significant/pertinent history, findings, studies include:  reports that he has never smoked. He has never used smokeless tobacco. Recent Labs    07/04/17  5681 10/03/17 1015 03/08/18 1004  HGBA1C 6.0 6.1* 6.2  LABURIC 4.6  --   --    No specialty comments available. No problems updated.  Otherwise prior history reviewed and updated per electronic medical record.    OBJECTIVE:  VS:  HT:5\' 7"  (170.2 cm)   WT:157 lb (71.2 kg)  BMI:24.58    BP:108/68  HR:92bpm  TEMP: ( )  RESP:95 %   PHYSICAL EXAM: CONSTITUTIONAL: Well-developed, Well-nourished and In no acute distress Alert & appropriately interactive. and Not depressed or anxious  appearing. RESPIRATORY: No increased work of breathing and Trachea Midline EYES: Pupils are equal., EOM intact without nystagmus. and No scleral icterus.  Upper extremities: Warm and well perfused Pulses: Radial Pulses: Bilaterally normal and symmetric NEURO: unremarkable Normal associated myotomal distribution strength to manual muscle testing Normal sensation to light touch Normal and symmetric associated DTRs  MSK Exam: Neck: . Well aligned, no significant deformity. . No overlying skin changes. . No focal bony tenderness . Limited side bending and rotation. .  . Normal, non-painful: Spurling's compression test, limits compression test and brachial plexus squeeze. Marland Kitchen Positive/Abnormal: Arm squeeze test slightly painful on the left compared to the right   PROCEDURES & DATA REVIEWED:  . None  ASSESSMENT   1. Left cervical radiculopathy   2. Osteoarthritis of spine with radiculopathy, cervical region   3. Radiculopathy, cervical region     PLAN:   Continue your home exercise program    . Continue with home therapeutic exercises. . Refill on gabapentin. Faythe Ghee to try to titrate off gabapentin as tolerated. . If any lack of improvement consider further diagnostic evaluation with Repeat epidural steroid injections and/or electrodiagnostic testing No problem-specific Assessment & Plan notes found for this encounter.   Follow-up: Return if symptoms worsen or fail to improve.      Please see additional documentation for Objective, Assessment and Plan sections. Pertinent additional documentation may be included in corresponding procedure notes, imaging studies, problem based documentation and patient instructions. Please see these sections of the encounter for additional information regarding this visit.  CMA/ATC served as Education administrator during this visit. History, Physical, and Plan performed by medical provider. Documentation and orders reviewed and attested to.      Gerda Diss, [facility] Physician

## 2018-05-10 ENCOUNTER — Other Ambulatory Visit: Payer: Self-pay

## 2018-05-10 ENCOUNTER — Other Ambulatory Visit: Payer: Self-pay | Admitting: Family Medicine

## 2018-05-10 MED ORDER — GLIPIZIDE 5 MG PO TABS: ORAL_TABLET | ORAL | 3 refills | Status: DC

## 2018-06-05 ENCOUNTER — Other Ambulatory Visit: Payer: Self-pay | Admitting: Family Medicine

## 2018-06-19 ENCOUNTER — Other Ambulatory Visit: Payer: Self-pay | Admitting: Family Medicine

## 2018-07-09 ENCOUNTER — Ambulatory Visit: Payer: Medicare Other | Admitting: Family Medicine

## 2018-07-09 ENCOUNTER — Encounter: Payer: Self-pay | Admitting: Family Medicine

## 2018-07-09 VITALS — BP 134/76 | HR 80 | Temp 97.9°F | Ht 67.0 in | Wt 150.8 lb

## 2018-07-09 DIAGNOSIS — M1A09X0 Idiopathic chronic gout, multiple sites, without tophus (tophi): Secondary | ICD-10-CM | POA: Diagnosis not present

## 2018-07-09 DIAGNOSIS — E1169 Type 2 diabetes mellitus with other specified complication: Secondary | ICD-10-CM | POA: Insufficient documentation

## 2018-07-09 DIAGNOSIS — E119 Type 2 diabetes mellitus without complications: Secondary | ICD-10-CM | POA: Diagnosis not present

## 2018-07-09 DIAGNOSIS — I10 Essential (primary) hypertension: Secondary | ICD-10-CM | POA: Diagnosis not present

## 2018-07-09 DIAGNOSIS — E785 Hyperlipidemia, unspecified: Secondary | ICD-10-CM | POA: Diagnosis not present

## 2018-07-09 DIAGNOSIS — L98499 Non-pressure chronic ulcer of skin of other sites with unspecified severity: Secondary | ICD-10-CM

## 2018-07-09 LAB — LIPID PANEL
CHOLESTEROL: 128 mg/dL (ref 0–200)
HDL: 38.4 mg/dL — ABNORMAL LOW (ref >39.00)
LDL Cholesterol: 74 mg/dL (ref 0–99)
NONHDL: 89.55
Total CHOL/HDL Ratio: 3
Triglycerides: 77 mg/dL (ref 0.0–149.0)
VLDL: 15.4 mg/dL (ref 0.0–40.0)

## 2018-07-09 LAB — COMPREHENSIVE METABOLIC PANEL
ALBUMIN: 4.1 g/dL (ref 3.5–5.2)
ALK PHOS: 76 U/L (ref 39–117)
ALT: 18 U/L (ref 0–53)
AST: 16 U/L (ref 0–37)
BUN: 11 mg/dL (ref 6–23)
CO2: 29 mEq/L (ref 19–32)
Calcium: 9.2 mg/dL (ref 8.4–10.5)
Chloride: 106 mEq/L (ref 96–112)
Creatinine, Ser: 0.82 mg/dL (ref 0.40–1.50)
GFR: 120.18 mL/min (ref >60.00)
Glucose, Bld: 212 mg/dL — ABNORMAL HIGH (ref 70–99)
POTASSIUM: 4.3 meq/L (ref 3.5–5.1)
Sodium: 141 mEq/L (ref 135–145)
TOTAL PROTEIN: 6.5 g/dL (ref 6.0–8.3)
Total Bilirubin: 0.6 mg/dL (ref 0.2–1.2)

## 2018-07-09 LAB — CBC WITH DIFFERENTIAL/PLATELET
BASOS ABS: 0 10*3/uL (ref 0.0–0.1)
Basophils Relative: 0.5 % (ref 0.0–3.0)
Eosinophils Absolute: 0.2 10*3/uL (ref 0.0–0.7)
Eosinophils Relative: 6.7 % — ABNORMAL HIGH (ref 0.0–5.0)
HCT: 42.9 % (ref 39.0–52.0)
HEMOGLOBIN: 13.8 g/dL (ref 13.0–17.0)
LYMPHS ABS: 1.4 10*3/uL (ref 0.7–4.0)
Lymphocytes Relative: 40.3 % (ref 12.0–46.0)
MCHC: 32.2 g/dL (ref 30.0–36.0)
MCV: 80.5 fl (ref 78.0–100.0)
MONOS PCT: 8.3 % (ref 3.0–12.0)
Monocytes Absolute: 0.3 10*3/uL (ref 0.1–1.0)
NEUTROS PCT: 44.2 % (ref 43.0–77.0)
Neutro Abs: 1.5 10*3/uL (ref 1.4–7.7)
Platelets: 88 10*3/uL — ABNORMAL LOW (ref 150.0–400.0)
RBC: 5.32 Mil/uL (ref 4.22–5.81)
RDW: 14.1 % (ref 11.5–15.5)
WBC: 3.4 10*3/uL — AB (ref 4.0–10.5)

## 2018-07-09 LAB — POCT GLYCOSYLATED HEMOGLOBIN (HGB A1C): Hemoglobin A1C: 6 % — AB (ref 4.0–5.6)

## 2018-07-09 LAB — URIC ACID: URIC ACID, SERUM: 4.4 mg/dL (ref 4.0–7.8)

## 2018-07-09 MED ORDER — AMLODIPINE BESYLATE 10 MG PO TABS: 10.0000 mg | ORAL_TABLET | Freq: Every day | ORAL | 3 refills | Status: DC

## 2018-07-09 MED ORDER — ATORVASTATIN CALCIUM 10 MG PO TABS: 10.0000 mg | ORAL_TABLET | ORAL | 3 refills | Status: DC

## 2018-07-09 NOTE — Assessment & Plan Note (Signed)
S: controlled on  amlodipine 10mg  and quinapril 20 mg  BP Readings from Last 3 Encounters:  07/09/18 134/76  04/25/18 108/68  03/08/18 130/80  A/P: We discussed blood pressure goal of <140/90. Continue current meds

## 2018-07-09 NOTE — Assessment & Plan Note (Signed)
S:  controlled on metformin 500mg  in AM and 1 g in PM lately, glipizide 5mg  daily.  CBGs-  running 150s in AM last visit , usually in 130s lately Lab Results  Component Value Date   HGBA1C 6.0 (A) 07/09/2018   HGBA1C 6.2 03/08/2018   HGBA1C 6.1 (H) 10/03/2017   A/P:  update a1c with labs

## 2018-07-09 NOTE — Progress Notes (Signed)
Subjective:  Preston Pennington is a 67 y.o. year old very pleasant male patient who presents for/with See problem oriented charting ROS- pain on bottom of foot. No edema. Leg length discrepancy per baseline. No low AM sugars.    Past Medical History-  Patient Active Problem List   Diagnosis Date Noted  . Diabetes mellitus type II, controlled ([place]) 04/09/2013    Priority: High  . Left cervical radiculopathy 09/24/2017    Priority: Medium  . History of adenomatous polyp of colon 11/06/2016    Priority: Medium  . Chronic ankle pain 06/14/2016    Priority: Medium  . Thrombocytopenia ([place]) 11/29/2015    Priority: Medium  . Gout 04/30/2015    Priority: Medium  . Essential hypertension 04/09/2013    Priority: Medium  . Osteoarthritis of spine with radiculopathy, cervical region 11/14/2017    Priority: Low  . Right knee pain 11/21/2016    Priority: Low  . Erectile dysfunction 02/04/2015    Priority: Low  . BPH (benign prostatic hyperplasia) 04/09/2013    Priority: Low  . Hyperlipidemia 07/09/2018    Medications- reviewed and updated Current Outpatient Medications  Medication Sig Dispense Refill  . acetaminophen (TYLENOL) 500 MG tablet Take 1,000 mg by mouth every 6 (six) hours as needed for moderate pain.     Marland Kitchen allopurinol (ZYLOPRIM) 100 MG tablet TAKE 1 TABLET BY MOUTH ONCE DAILY 90 tablet 3  . amLODipine (NORVASC) 10 MG tablet Take 1 tablet (10 mg total) by mouth daily. 90 tablet 3  . blood glucose meter kit and supplies KIT Dispense based on patient and insurance preference. Use 2 times daily as directed. E11.9 Accu-Check Smart View Meter 1 each 0  . Camphor-Eucalyptus-Menthol (VICKS VAPORUB EX) Apply 1 application topically at bedtime as needed (pain/ congestions).    . feeding supplement, ENSURE ENLIVE, (ENSURE ENLIVE) LIQD Take 1 Bottle by mouth daily.    Marland Kitchen gabapentin (NEURONTIN) 300 MG capsule Take 1 capsule (300 mg total) by mouth 4 (four) times daily. 360 capsule 3  . glipiZIDE  (GLUCOTROL) 5 MG tablet TAKE 1 TABLET BY MOUTH ONCE DAILY BEFORE BREAKFAST 90 tablet 3  . glipiZIDE (GLUCOTROL) 5 MG tablet TAKE ONE TABLET BY MOUTH ONCE DAILY BEFORE BREAKFAST 90 tablet 3  . glucose blood (ACCU-CHEK SMARTVIEW) test strip Use as instructed 100 each 12  . metFORMIN (GLUCOPHAGE) 1000 MG tablet TAKE 1 TABLET BY MOUTH TWICE DAILY WITH A MEAL 180 tablet 1  . MITIGARE 0.6 MG CAPS TAKE 2 TABLETS BY MOUTH AT THE FIRST SIGN OF GOUT THEN 1 PILL 2 HOURS LATER THEN 1 PILL DAILY UNTIL FLARE IS RESOLVED 30 capsule 3  . ONETOUCH DELICA LANCETS FINE MISC Use to check blood sugar once a day. E11.9 90 each 3  . quinapril (ACCUPRIL) 20 MG tablet TAKE 1 TABLET BY MOUTH AT BEDTIME 90 tablet 1  . Tetrahyd-Glyc-Hypro-PEG-ZnSulf (VISINE TOTALITY MULTI-SYMPTOM OP) Apply 1 drop to eye daily as needed (dry eyes).     No current facility-administered medications for this visit.     Objective: BP 134/76 (BP Location: Left Arm, Patient Position: Sitting, Cuff Size: Normal)   Pulse 80   Temp 97.9 F (36.6 C) (Oral)   Ht _0  (1.702 m)   Wt 150 lb 12.8 oz (68.4 kg)   SpO2 96%   BMI 23.62 kg/m  Gen: NAD, resting comfortably CV: RRR no murmurs rubs or gallops Lungs: CTAB no crackles, wheeze, rhonchi Abdomen: soft/nontender/nondistended Ext: no edema Skin: warm, dry  Diabetic  Foot Exam - Simple   Simple Foot Form Visual Inspection See comments:  Yes Sensation Testing Intact to touch and monofilament testing bilaterally:  Yes Pulse Check Posterior Tibialis and Dorsalis pulse intact bilaterally:  Yes Comments Callous noted at base of 1st MTP and below IP joint- both tender to touch    Assessment/Plan:  Diabetes mellitus type II, controlled ([place]) S:  controlled on metformin 575m in AM and 1 g in PM lately, glipizide 553mdaily.  CBGs-  running 150s in AM last visit , usually in 130s lately Lab Results  Component Value Date   HGBA1C 6.0 (A) 07/09/2018   HGBA1C 6.2 03/08/2018   HGBA1C 6.1  (H) 10/03/2017   A/P:  update a1c with labs  Essential hypertension S: controlled on  amlodipine 1065mnd quinapril 20 mg  BP Readings from Last 3 Encounters:  07/09/18 134/76  04/25/18 108/68  03/08/18 130/80  A/P: We discussed blood pressure goal of <140/90. Continue current meds  Hyperlipidemia S: poorly controlled on no rx- had advised atorvastatin 36m63mce weekly but does not appear that got sent in Lab Results  Component Value Date   CHOL 168 02/04/2015   HDL 48.10 02/04/2015   LDLCALC 106 (H) 02/04/2015   LDLDIRECT 96.0 03/08/2018   TRIG 72.0 02/04/2015   CHOLHDL 3 02/04/2015   A/P:  We went ahead and sent in atorvastatin 36mg72m weekly use but will get full cholesterol panel today  Gout S:  gout flares even with well controlled uric acid (update again today). Black eyed beans seem to be trigger per him- seems to be better if avoids. Was having coverage issues last year with colchicine but finally approved with mitigare.   A/P: last uric acid was 4.6- will update today. Push allopurinol to 200mg 31mric acid over 6  Return in about 4 months (around 11/08/2018) for follow up- or sooner if needed.  Lab/Order associations: Controlled type 2 diabetes mellitus without complication, without long-term current use of insulin ([facility]) -Preston Pennington: CBC with Differential/Platelet, Lipid panel, Comprehensive metabolic panel, POCT glycosylated hemoglobin (Hb A1C), Ambulatory referral to Podiatry, CANCELED: Hemoglobin A1c  Idiopathic chronic gout of multiple sites without tophus - Plan: Uric acid  Essential hypertension - Plan: CBC with Differential/Platelet, Lipid panel, Comprehensive metabolic panel  Hyperlipidemia, unspecified hyperlipidemia type  Callous ulcer, with unspecified severity (HCC) -[place]: Ambulatory referral to Podiatry  Meds ordered this encounter  Medications  . amLODipine (NORVASC) 10 MG tablet    Sig: Take 1 tablet (10 mg total) by mouth daily.    Dispense:  90  tablet    Refill:  3  . atorvastatin (LIPITOR) 10 MG tablet    Sig: Take 1 tablet (10 mg total) by mouth once a week.    Dispense:  13 tablet    Refill:  3   Return precautions advised.  StepheGarret Reddish

## 2018-07-09 NOTE — Patient Instructions (Addendum)
Please stop by lab before you go  Health Maintenance Due  Topic Date Due  . OPHTHALMOLOGY EXAM - pt to call and schedule exam within the next month 03/27/2018  . FOOT EXAM - done today 03/29/2018  . PNA vac Low Risk Adult (2 of 2 - PPSV23)- check with your pharmacy and have them send [place] the dates or you send [place] the dates for pneumovax 23/pneumonia shot and the shingrix as well 03/29/2018   We will call you within two weeks about your referral to podiatry. If you do not hear within 3 weeks, give [place] a call.   Please start atorvastatin 10mg  once a week

## 2018-07-09 NOTE — Assessment & Plan Note (Addendum)
S:  gout flares even with well controlled uric acid (update again today). Black eyed beans seem to be trigger per him- seems to be better if avoids. Was having coverage issues last year with colchicine but finally approved with mitigare.   A/P: last uric acid was 4.6- will update today. Push allopurinol to 200mg  if uric acid over 6  On foot exam- he points to callous as source of pain- told him this was not likely gout related and went over signs/symptoms of gout- we will refer to podiatry for this issue especially with his diabetes

## 2018-07-09 NOTE — Assessment & Plan Note (Signed)
S: poorly controlled on no rx- had advised atorvastatin 10mg  once weekly but does not appear that got sent in Lab Results  Component Value Date   CHOL 168 02/04/2015   HDL 48.10 02/04/2015   LDLCALC 106 (H) 02/04/2015   LDLDIRECT 96.0 03/08/2018   TRIG 72.0 02/04/2015   CHOLHDL 3 02/04/2015   A/P:  We went ahead and sent in atorvastatin 10mg  for weekly use but will get full cholesterol panel today

## 2018-07-10 ENCOUNTER — Other Ambulatory Visit: Payer: Self-pay

## 2018-07-10 DIAGNOSIS — D72819 Decreased white blood cell count, unspecified: Secondary | ICD-10-CM

## 2018-07-23 NOTE — Addendum Note (Signed)
Addended by: Frutoso Chase A on: 07/23/2018 09:17 AM   Modules accepted: Orders

## 2018-07-24 ENCOUNTER — Other Ambulatory Visit (INDEPENDENT_AMBULATORY_CARE_PROVIDER_SITE_OTHER): Payer: Medicare Other

## 2018-07-24 DIAGNOSIS — D72819 Decreased white blood cell count, unspecified: Secondary | ICD-10-CM

## 2018-07-25 ENCOUNTER — Ambulatory Visit (INDEPENDENT_AMBULATORY_CARE_PROVIDER_SITE_OTHER): Payer: Medicaid Other

## 2018-07-25 ENCOUNTER — Other Ambulatory Visit: Payer: Self-pay | Admitting: Podiatry

## 2018-07-25 ENCOUNTER — Encounter: Payer: Self-pay | Admitting: Podiatry

## 2018-07-25 ENCOUNTER — Ambulatory Visit (INDEPENDENT_AMBULATORY_CARE_PROVIDER_SITE_OTHER): Payer: Medicaid Other | Admitting: Podiatry

## 2018-07-25 VITALS — BP 125/79 | HR 86

## 2018-07-25 DIAGNOSIS — M778 Other enthesopathies, not elsewhere classified: Secondary | ICD-10-CM

## 2018-07-25 DIAGNOSIS — M7751 Other enthesopathy of right foot: Secondary | ICD-10-CM | POA: Diagnosis not present

## 2018-07-25 DIAGNOSIS — M2580 Other specified joint disorders, unspecified joint: Secondary | ICD-10-CM

## 2018-07-25 DIAGNOSIS — M6240 Contracture of muscle, unspecified site: Secondary | ICD-10-CM

## 2018-07-25 DIAGNOSIS — Z8781 Personal history of (healed) traumatic fracture: Secondary | ICD-10-CM

## 2018-07-25 DIAGNOSIS — M205X1 Other deformities of toe(s) (acquired), right foot: Secondary | ICD-10-CM

## 2018-07-25 DIAGNOSIS — M779 Enthesopathy, unspecified: Principal | ICD-10-CM

## 2018-07-25 DIAGNOSIS — M7752 Other enthesopathy of left foot: Secondary | ICD-10-CM | POA: Diagnosis not present

## 2018-07-25 LAB — CBC WITH DIFFERENTIAL/PLATELET
BASOS PCT: 0.7 %
Basophils Absolute: 29 cells/uL (ref 0–200)
Eosinophils Absolute: 231 cells/uL (ref 15–500)
Eosinophils Relative: 5.5 %
HCT: 43.3 % (ref 38.5–50.0)
Hemoglobin: 13.9 g/dL (ref 13.2–17.1)
LYMPHS ABS: 1772 {cells}/uL (ref 850–3900)
MCH: 25.8 pg — ABNORMAL LOW (ref 27.0–33.0)
MCHC: 32.1 g/dL (ref 32.0–36.0)
MCV: 80.5 fL (ref 80.0–100.0)
Monocytes Relative: 7.6 %
Neutro Abs: 1848 cells/uL (ref 1500–7800)
Neutrophils Relative %: 44 %
PLATELETS: 100 10*3/uL — AB (ref 140–400)
RBC: 5.38 10*6/uL (ref 4.20–5.80)
RDW: 13.2 % (ref 11.0–15.0)
TOTAL LYMPHOCYTE: 42.2 %
WBC: 4.2 10*3/uL (ref 3.8–10.8)
WBCMIX: 319 {cells}/uL (ref 200–950)

## 2018-07-25 LAB — PATHOLOGIST SMEAR REVIEW

## 2018-07-25 NOTE — Progress Notes (Signed)
Subjective:  Patient ID: Preston Pennington, male    DOB: 07/20/1951,  MRN: [id number]  Chief Complaint  Patient presents with  . Toe Pain    right great toe  . Foot Pain    bilateral    67 y.o. male presents with the above complaint.  Reports painful callus to the right foot.  Has been on the great toe and one underneath the ball of the foot.  Endorses history of right foot fracture that did not heal right and he had to undergo surgery.  Not having pain at the ankle today.  Review of Systems: Negative except as noted in the HPI. Denies N/V/F/Ch.  Past Medical History:  Diagnosis Date  . Diabetes mellitus without complication ([place])   . Difficult intubation    "mouth was not wide enough"  . Hypertension   . Prostate enlargement 2010   See's Urologist     Current Outpatient Medications:  .  acetaminophen (TYLENOL) 500 MG tablet, Take 1,000 mg by mouth every 6 (six) hours as needed for moderate pain. , Disp: , Rfl:  .  allopurinol (ZYLOPRIM) 100 MG tablet, TAKE 1 TABLET BY MOUTH ONCE DAILY, Disp: 90 tablet, Rfl: 3 .  amLODipine (NORVASC) 10 MG tablet, Take 1 tablet (10 mg total) by mouth daily., Disp: 90 tablet, Rfl: 3 .  atorvastatin (LIPITOR) 10 MG tablet, Take 1 tablet (10 mg total) by mouth once a week., Disp: 13 tablet, Rfl: 3 .  blood glucose meter kit and supplies KIT, Dispense based on patient and insurance preference. Use 2 times daily as directed. E11.9 Accu-Check [employer], Disp: 1 each, Rfl: 0 .  Camphor-Eucalyptus-Menthol (VICKS VAPORUB EX), Apply 1 application topically at bedtime as needed (pain/ congestions)., Disp: , Rfl:  .  feeding supplement, ENSURE ENLIVE, (ENSURE ENLIVE) LIQD, Take 1 Bottle by mouth daily., Disp: , Rfl:  .  gabapentin (NEURONTIN) 300 MG capsule, Take 1 capsule (300 mg total) by mouth 4 (four) times daily., Disp: 360 capsule, Rfl: 3 .  glipiZIDE (GLUCOTROL) 5 MG tablet, TAKE ONE TABLET BY MOUTH ONCE DAILY BEFORE BREAKFAST, Disp: 90 tablet, Rfl:  3 .  glucose blood (ACCU-CHEK SMARTVIEW) test strip, Use as instructed, Disp: 100 each, Rfl: 12 .  metFORMIN (GLUCOPHAGE) 1000 MG tablet, TAKE 1 TABLET BY MOUTH TWICE DAILY WITH A MEAL, Disp: 180 tablet, Rfl: 1 .  MITIGARE 0.6 MG CAPS, TAKE 2 TABLETS BY MOUTH AT THE FIRST SIGN OF GOUT THEN 1 PILL 2 HOURS LATER THEN 1 PILL DAILY UNTIL FLARE IS RESOLVED, Disp: 30 capsule, Rfl: 3 .  ONETOUCH DELICA LANCETS FINE MISC, Use to check blood sugar once a day. E11.9, Disp: 90 each, Rfl: 3 .  quinapril (ACCUPRIL) 20 MG tablet, TAKE 1 TABLET BY MOUTH AT BEDTIME, Disp: 90 tablet, Rfl: 1 .  Tetrahyd-Glyc-Hypro-PEG-ZnSulf (VISINE TOTALITY MULTI-SYMPTOM OP), Apply 1 drop to eye daily as needed (dry eyes)., Disp: , Rfl:   Social History   Tobacco Use  Smoking Status Never Smoker  Smokeless Tobacco Never Used    No Known Allergies Objective:   Vitals:   07/25/18 1001  BP: 125/79  Pulse: 86   There is no height or weight on file to calculate BMI. Constitutional Well developed. Well nourished.  Vascular Dorsalis pedis pulses palpable bilaterally. Posterior tibial pulses palpable bilaterally. Capillary refill normal to all digits.  No cyanosis or clubbing noted. Pedal hair growth normal.  Neurologic Normal speech. Oriented to person, place, and time. Epicritic sensation to light touch  grossly present bilaterally.  Dermatologic Nails well groomed and normal in appearance. No open wounds. Hyperkeratosis hallux IPJ right, first MPJ right  Orthopedic: Normal joint ROM without pain or crepitus bilaterally. Hallux extensors noted right.  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 Pain to palpation about the hallux IPJ right Pain palpation about the right hallux IPJ and first metatarsal plantarly   Radiographs: Taken and reviewed.  Hallux IPJ sesamoid noted.  No acute fractures dislocations Assessment:   1. Sesamoiditis   2. Hallux extensus, acquired, right   3. Tendon contracture   4.  Capsulitis of metatarsophalangeal (MTP) joint of right foot   5. History of fracture of right ankle    Plan:  Patient was evaluated and treated and all questions answered.  Right hallux sesamoid and capsulitis of the first MPJ -X-rays reviewed with patient. -Educated on underlying biomechanical etiology.  Likely residual hallux extensors contracture from checkrein deformity at the ankle. -Discussed he would benefit from excision of the hallux IPJ sesamoid and extensor tendon lengthening to prevent the pain he is receiving at the bottom of the great toe.  Will trial injection conservative therapy first.  Further discuss surgery next visit  Procedure: Joint Injection Location: Left hallux IPJ joint Skin Prep: Alcohol. Injectate: 0.5 cc 1% lidocaine plain, 0.5 cc dexamethasone phosphate. Disposition: Patient tolerated procedure well. Injection site dressed with a band-aid.  Porokeratoses -Debrided x2.  Educated on underlying biomechanical etiology  Procedure: Paring of Lesion Rationale: painful hyperkeratotic lesion Type of Debridement: manual, sharp debridement. Instrumentation: 312 blade Number of Lesions: 2  Return in about 3 weeks (around 08/15/2018) for Capsulitis, Right, Hallux IPJ sesamoid R.

## 2018-08-07 ENCOUNTER — Telehealth: Payer: Self-pay | Admitting: Family Medicine

## 2018-08-07 NOTE — Telephone Encounter (Signed)
I want patient to try half tablet before bed In a week. If he has similar symptoms - Stop permanently. If no similar symptoms- also let us know that and we can send in the lower dose at 5mg  atorvastatin

## 2018-08-07 NOTE — Telephone Encounter (Signed)
Copied from [place] [id number]. Topic: Quick Communication - See Telephone Encounter >> Aug 07, 2018  8:47 AM Gardiner Ramus wrote: CRM for notification. See Telephone encounter for: 08/07/18. Pt called and stated that took atorvastatin (LIPITOR) 10 MG tablet [[id number]]  and felt dizzy and it was hard for him to stand. Patient would like a call back. Please advise [phone]

## 2018-08-07 NOTE — Telephone Encounter (Signed)
See note

## 2018-08-07 NOTE — Telephone Encounter (Signed)
Called and spoke to patient. He stated it was the first time he had taken Lipitor. I advised him not to take another dose. Patient verbalized understanding. Please advise.

## 2018-08-15 ENCOUNTER — Encounter: Payer: Self-pay | Admitting: Podiatry

## 2018-08-15 ENCOUNTER — Ambulatory Visit (INDEPENDENT_AMBULATORY_CARE_PROVIDER_SITE_OTHER): Payer: Medicaid Other | Admitting: Podiatry

## 2018-08-15 ENCOUNTER — Ambulatory Visit: Payer: Medicare Other | Admitting: Podiatry

## 2018-08-15 DIAGNOSIS — M2580 Other specified joint disorders, unspecified joint: Secondary | ICD-10-CM

## 2018-08-15 DIAGNOSIS — Z8781 Personal history of (healed) traumatic fracture: Secondary | ICD-10-CM

## 2018-08-15 DIAGNOSIS — M7751 Other enthesopathy of right foot: Secondary | ICD-10-CM | POA: Diagnosis not present

## 2018-08-15 DIAGNOSIS — M205X1 Other deformities of toe(s) (acquired), right foot: Secondary | ICD-10-CM | POA: Diagnosis not present

## 2018-08-15 DIAGNOSIS — M6240 Contracture of muscle, unspecified site: Secondary | ICD-10-CM

## 2018-08-15 NOTE — Patient Instructions (Signed)
Pre-Operative Instructions  Congratulations, you have decided to take an important step towards improving your quality of life.  You can be assured that the doctors and staff at [facility] will be with you every step of the way.  Here are some important things you should know:  1. Plan to be at the surgery center/hospital at least 1 (one) hour prior to your scheduled time, unless otherwise directed by the surgical center/hospital staff.  You must have a responsible adult accompany you, remain during the surgery and drive you home.  Make sure you have directions to the surgical center/hospital to ensure you arrive on time. 2. If you are having surgery at [facility] or [facility], you will need a copy of your medical history and physical form from your family physician within one month prior to the date of surgery. We will give you a form for your primary physician to complete.  3. We make every effort to accommodate the date you request for surgery.  However, there are times where surgery dates or times have to be moved.  We will contact you as soon as possible if a change in schedule is required.   4. No aspirin/ibuprofen for one week before surgery.  If you are on aspirin, any non-steroidal anti-inflammatory medications (Mobic, Aleve, Ibuprofen) should not be taken seven (7) days prior to your surgery.  You make take Tylenol for pain prior to surgery.  5. Medications - If you are taking daily heart and blood pressure medications, seizure, reflux, allergy, asthma, anxiety, pain or diabetes medications, make sure you notify the surgery center/hospital before the day of surgery so they can tell you which medications you should take or avoid the day of surgery. 6. No food or drink after midnight the night before surgery unless directed otherwise by surgical center/hospital staff. 7. No alcoholic beverages 24-hours prior to surgery.  No smoking 24-hours prior or 24-hours after  surgery. 8. Wear loose pants or shorts. They should be loose enough to fit over bandages, boots, and casts. 9. Don't wear slip-on shoes. Sneakers are preferred. 10. Bring your boot with you to the surgery center/hospital.  Also bring crutches or a walker if your physician has prescribed it for you.  If you do not have this equipment, it will be provided for you after surgery. 11. If you have not been contacted by the surgery center/hospital by the day before your surgery, call to confirm the date and time of your surgery. 12. Leave-time from work may vary depending on the type of surgery you have.  Appropriate arrangements should be made prior to surgery with your employer. 13. Prescriptions will be provided immediately following surgery by your doctor.  Fill these as soon as possible after surgery and take the medication as directed. Pain medications will not be refilled on weekends and must be approved by the doctor. 14. Remove nail polish on the operative foot and avoid getting pedicures prior to surgery. 15. Wash the night before surgery.  The night before surgery wash the foot and leg well with water and the antibacterial soap provided. Be sure to pay special attention to beneath the toenails and in between the toes.  Wash for at least three (3) minutes. Rinse thoroughly with water and dry well with a towel.  Perform this wash unless told not to do so by your physician.  Enclosed: 1 Ice pack (please put in freezer the night before surgery)   1 Hibiclens skin cleaner     Pre-op instructions  If you have any questions regarding the instructions, please do not hesitate to call our office.  [place]: [address], [place], [place] [address] -- [phone]  [place]: [address] [place], [place] [address] -- [phone]  [place]: [address]  [place], [place] [address] -- [phone]  [place]: [address], [address], [place], [place] [address] -- [phone]  Website:  [email or web address] 

## 2018-08-15 NOTE — Telephone Encounter (Signed)
Called and spoke with patient. Reviewed Dr Ronney Lion instructions. Patient stated understanding and will call office back tomorrow to give his update.

## 2018-08-16 ENCOUNTER — Other Ambulatory Visit: Payer: Self-pay | Admitting: Family Medicine

## 2018-08-19 ENCOUNTER — Telehealth: Payer: Self-pay | Admitting: Family Medicine

## 2018-08-19 NOTE — Telephone Encounter (Signed)
Copied from [place] [id number]. Topic: Quick Communication - Rx Refill/Question >> Aug 19, 2018 11:12 AM Judyann Munson wrote: Medication: gabapentin (NEURONTIN) 300 MG capsule   Has the patient contacted their pharmacy? no   Preferred Pharmacy (with phone number or street name): [facility], [place]. [phone] (Phone) [phone] (Fax)

## 2018-08-19 NOTE — Telephone Encounter (Signed)
See note

## 2018-08-20 MED ORDER — GABAPENTIN 300 MG PO CAPS: ORAL_CAPSULE | ORAL | 0 refills | Status: DC

## 2018-08-20 NOTE — Telephone Encounter (Signed)
Rx has been refilled.  

## 2018-08-29 NOTE — Progress Notes (Signed)
Subjective:  Patient ID: Preston Pennington, male    DOB: 1951/08/08,  MRN: [id number]  Chief Complaint  Patient presents with  . Capsulitis    right foot follow up; pt stated, "still little pain but not as bad"  . Callouses    right foot ball of foot under great toe; callous trim    67 y.o. male presents with the above complaint. Still having pain but not as bad.  Review of Systems: Negative except as noted in the HPI. Denies N/V/F/Ch.  Past Medical History:  Diagnosis Date  . Diabetes mellitus without complication ([place])   . Difficult intubation    "mouth was not wide enough"  . Hypertension   . Prostate enlargement 2010   See's Urologist     Current Outpatient Medications:  .  acetaminophen (TYLENOL) 500 MG tablet, Take 1,000 mg by mouth every 6 (six) hours as needed for moderate pain. , Disp: , Rfl:  .  allopurinol (ZYLOPRIM) 100 MG tablet, TAKE 1 TABLET BY MOUTH ONCE DAILY, Disp: 90 tablet, Rfl: 3 .  amLODipine (NORVASC) 10 MG tablet, Take 1 tablet (10 mg total) by mouth daily., Disp: 90 tablet, Rfl: 3 .  atorvastatin (LIPITOR) 10 MG tablet, Take 1 tablet (10 mg total) by mouth once a week., Disp: 13 tablet, Rfl: 3 .  blood glucose meter kit and supplies KIT, Dispense based on patient and insurance preference. Use 2 times daily as directed. E11.9 Accu-Check [employer], Disp: 1 each, Rfl: 0 .  Camphor-Eucalyptus-Menthol (VICKS VAPORUB EX), Apply 1 application topically at bedtime as needed (pain/ congestions)., Disp: , Rfl:  .  feeding supplement, ENSURE ENLIVE, (ENSURE ENLIVE) LIQD, Take 1 Bottle by mouth daily., Disp: , Rfl:  .  glipiZIDE (GLUCOTROL) 5 MG tablet, TAKE ONE TABLET BY MOUTH ONCE DAILY BEFORE BREAKFAST, Disp: 90 tablet, Rfl: 3 .  glucose blood (ACCU-CHEK SMARTVIEW) test strip, Use as instructed, Disp: 100 each, Rfl: 12 .  metFORMIN (GLUCOPHAGE) 1000 MG tablet, TAKE 1 TABLET BY MOUTH TWICE DAILY WITH A MEAL, Disp: 180 tablet, Rfl: 1 .  MITIGARE 0.6 MG CAPS, TAKE 2  TABLETS BY MOUTH AT THE FIRST SIGN OF GOUT THEN 1 PILL 2 HOURS LATER THEN 1 PILL DAILY UNTIL FLARE IS RESOLVED, Disp: 30 capsule, Rfl: 3 .  ONETOUCH DELICA LANCETS FINE MISC, Use to check blood sugar once a day. E11.9, Disp: 90 each, Rfl: 3 .  Tetrahyd-Glyc-Hypro-PEG-ZnSulf (VISINE TOTALITY MULTI-SYMPTOM OP), Apply 1 drop to eye daily as needed (dry eyes)., Disp: , Rfl:  .  gabapentin (NEURONTIN) 300 MG capsule, Take 1 capsule, orally, QID or as directed, Disp: 360 capsule, Rfl: 0 .  quinapril (ACCUPRIL) 20 MG tablet, TAKE 1 TABLET BY MOUTH AT BEDTIME, Disp: 90 tablet, Rfl: 1  Social History   Tobacco Use  Smoking Status Never Smoker  Smokeless Tobacco Never Used    No Known Allergies Objective:   There were no vitals filed for this visit. There is no height or weight on file to calculate BMI. Constitutional Well developed. Well nourished.  Vascular Dorsalis pedis pulses palpable bilaterally. Posterior tibial pulses palpable bilaterally. Capillary refill normal to all digits.  No cyanosis or clubbing noted. Pedal hair growth normal.  Neurologic Normal speech. Oriented to person, place, and time. Epicritic sensation to light touch grossly present bilaterally.  Dermatologic Nails well groomed and normal in appearance. No open wounds. Hyperkeratosis hallux IPJ right, first MPJ right  Orthopedic: Normal joint ROM without pain or crepitus bilaterally. Hallux extensors  noted right.  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 Pain to palpation about the hallux IPJ right Pain palpation about the right hallux IPJ and first metatarsal plantarly    Radiographs:  9/26: Hallux IPJ sesamoid noted.  No acute fractures dislocations Assessment:   1. Sesamoiditis   2. Tendon contracture   3. Hallux extensus, acquired, right   4. Capsulitis of metatarsophalangeal (MTP) joint of right foot   5. History of fracture of right ankle    Plan:  Patient was evaluated and treated and all  questions answered.  Right hallux sesamoid and capsulitis of the first MPJ -X-rays reviewed with patient. -Educated on underlying biomechanical etiology.  Likely residual hallux extensors contracture from checkrein deformity at the ankle. -Again discussed he would benefit from excision of the hallux IPJ sesamoid and extensor tendon lengthening to prevent the pain he is receiving at the bottom of the great toe. Will plan for distal lengthening to avoid the scar tissue at the ankle -Post-op course and surgical plan discussed. -Patient has failed all conservative therapy and wishes to proceed with surgical intervention. All risks, benefits, and alternatives discussed with patient. No guarantees given. Consent reviewed and signed by patient. -Planned procedures: Excision of sesamoid with extensor tendon lengthening.  Return for post op.

## 2018-11-04 ENCOUNTER — Other Ambulatory Visit: Payer: Self-pay | Admitting: Family Medicine

## 2018-11-08 ENCOUNTER — Encounter: Payer: Self-pay | Admitting: Family Medicine

## 2018-11-08 ENCOUNTER — Ambulatory Visit (INDEPENDENT_AMBULATORY_CARE_PROVIDER_SITE_OTHER): Payer: Medicaid Other | Admitting: Family Medicine

## 2018-11-08 DIAGNOSIS — M2580 Other specified joint disorders, unspecified joint: Secondary | ICD-10-CM | POA: Diagnosis not present

## 2018-11-08 DIAGNOSIS — E1169 Type 2 diabetes mellitus with other specified complication: Secondary | ICD-10-CM

## 2018-11-08 DIAGNOSIS — I10 Essential (primary) hypertension: Secondary | ICD-10-CM | POA: Diagnosis not present

## 2018-11-08 DIAGNOSIS — E785 Hyperlipidemia, unspecified: Secondary | ICD-10-CM | POA: Diagnosis not present

## 2018-11-08 DIAGNOSIS — E119 Type 2 diabetes mellitus without complications: Secondary | ICD-10-CM | POA: Diagnosis not present

## 2018-11-08 LAB — COMPREHENSIVE METABOLIC PANEL
ALK PHOS: 66 U/L (ref 39–117)
ALT: 16 U/L (ref 0–53)
AST: 15 U/L (ref 0–37)
Albumin: 4.2 g/dL (ref 3.5–5.2)
BILIRUBIN TOTAL: 0.5 mg/dL (ref 0.2–1.2)
BUN: 17 mg/dL (ref 6–23)
CO2: 28 mEq/L (ref 19–32)
CREATININE: 0.91 mg/dL (ref 0.40–1.50)
Calcium: 9.7 mg/dL (ref 8.4–10.5)
Chloride: 103 mEq/L (ref 96–112)
GFR: 106.46 mL/min (ref >60.00)
Glucose, Bld: 120 mg/dL — ABNORMAL HIGH (ref 70–99)
Potassium: 4.1 mEq/L (ref 3.5–5.1)
SODIUM: 138 meq/L (ref 135–145)
TOTAL PROTEIN: 6.5 g/dL (ref 6.0–8.3)

## 2018-11-08 LAB — CBC WITH DIFFERENTIAL/PLATELET
BASOS ABS: 0 10*3/uL (ref 0.0–0.1)
Basophils Relative: 0.6 % (ref 0.0–3.0)
Eosinophils Absolute: 0.3 10*3/uL (ref 0.0–0.7)
Eosinophils Relative: 6.4 % — ABNORMAL HIGH (ref 0.0–5.0)
HCT: 43.8 % (ref 39.0–52.0)
Hemoglobin: 14.1 g/dL (ref 13.0–17.0)
Lymphocytes Relative: 47.1 % — ABNORMAL HIGH (ref 12.0–46.0)
Lymphs Abs: 2.2 10*3/uL (ref 0.7–4.0)
MCHC: 32.2 g/dL (ref 30.0–36.0)
MCV: 81.8 fl (ref 78.0–100.0)
Monocytes Absolute: 0.4 10*3/uL (ref 0.1–1.0)
Monocytes Relative: 8.9 % (ref 3.0–12.0)
Neutro Abs: 1.7 10*3/uL (ref 1.4–7.7)
Neutrophils Relative %: 37 % — ABNORMAL LOW (ref 43.0–77.0)
Platelets: 107 10*3/uL — ABNORMAL LOW (ref 150.0–400.0)
RBC: 5.35 Mil/uL (ref 4.22–5.81)
RDW: 13.9 % (ref 11.5–15.5)
WBC: 4.6 10*3/uL (ref 4.0–10.5)

## 2018-11-08 LAB — LDL CHOLESTEROL, DIRECT: Direct LDL: 108 mg/dL

## 2018-11-08 LAB — HEMOGLOBIN A1C: Hgb A1c MFr Bld: 5.9 % (ref 4.6–6.5)

## 2018-11-08 MED ORDER — DICLOFENAC SODIUM 1 % TD GEL: 2.0000 g | Freq: Four times a day (QID) | TRANSDERMAL | 3 refills | Status: DC

## 2018-11-08 MED ORDER — ATORVASTATIN CALCIUM 10 MG PO TABS: 5.0000 mg | ORAL_TABLET | ORAL | 3 refills | Status: DC

## 2018-11-08 NOTE — Assessment & Plan Note (Signed)
S: sesamoiditis- he does not want surgery. Has seen podiatry back in October.  A/P: really bothering him- will trial voltaren gel and needs to monitor the foot daily- if any issues/reaction to medicine needs to stop. I think voltaren gel is more ideal given diabetes/age and risk to kidneys of long term oral nsaids

## 2018-11-08 NOTE — Progress Notes (Signed)
Subjective:  Preston Pennington is a 68 y.o. year old very pleasant male patient who presents for/with See problem oriented charting ROS- right foot pain over sesamoid bone. No fever or chills. No chest pain or shortness of breath. No headache or blurry vision.    Past Medical History-  Patient Active Problem List   Diagnosis Date Noted  . Diabetes mellitus type II, controlled ([place]) 04/09/2013    Priority: High  . Left cervical radiculopathy 09/24/2017    Priority: Medium  . History of adenomatous polyp of colon 11/06/2016    Priority: Medium  . Chronic ankle pain 06/14/2016    Priority: Medium  . Thrombocytopenia ([place]) 11/29/2015    Priority: Medium  . Gout 04/30/2015    Priority: Medium  . Essential hypertension 04/09/2013    Priority: Medium  . Osteoarthritis of spine with radiculopathy, cervical region 11/14/2017    Priority: Low  . Right knee pain 11/21/2016    Priority: Low  . Erectile dysfunction 02/04/2015    Priority: Low  . BPH (benign prostatic hyperplasia) 04/09/2013    Priority: Low  . Sesamoiditis 11/08/2018  . Hyperlipidemia associated with type 2 diabetes mellitus ([place]) 07/09/2018    Medications- reviewed and updated Current Outpatient Medications  Medication Sig Dispense Refill  . acetaminophen (TYLENOL) 500 MG tablet Take 1,000 mg by mouth every 6 (six) hours as needed for moderate pain.     Marland Kitchen allopurinol (ZYLOPRIM) 100 MG tablet TAKE 1 TABLET BY MOUTH ONCE DAILY 90 tablet 3  . amLODipine (NORVASC) 10 MG tablet Take 1 tablet (10 mg total) by mouth daily. 90 tablet 3  . atorvastatin (LIPITOR) 10 MG tablet Take 0.5 tablets (5 mg total) by mouth once a week. 7 tablet 3  . blood glucose meter kit and supplies KIT Dispense based on patient and insurance preference. Use 2 times daily as directed. E11.9 Accu-Check Smart View Meter 1 each 0  . Camphor-Eucalyptus-Menthol (VICKS VAPORUB EX) Apply 1 application topically at bedtime as needed (pain/ congestions).    . feeding  supplement, ENSURE ENLIVE, (ENSURE ENLIVE) LIQD Take 1 Bottle by mouth daily.    Marland Kitchen gabapentin (NEURONTIN) 300 MG capsule Take 1 capsule, orally, QID or as directed 360 capsule 0  . glipiZIDE (GLUCOTROL) 5 MG tablet TAKE ONE TABLET BY MOUTH ONCE DAILY BEFORE BREAKFAST 90 tablet 3  . glucose blood (ACCU-CHEK SMARTVIEW) test strip Use as instructed 100 each 12  . metFORMIN (GLUCOPHAGE) 1000 MG tablet TAKE 1 TABLET BY MOUTH TWICE DAILY WITH MEALS 180 tablet 1  . MITIGARE 0.6 MG CAPS TAKE 2 TABLETS BY MOUTH AT THE FIRST SIGN OF GOUT THEN 1 PILL 2 HOURS LATER THEN 1 PILL DAILY UNTIL FLARE IS RESOLVED 30 capsule 3  . ONETOUCH DELICA LANCETS FINE MISC Use to check blood sugar once a day. E11.9 90 each 3  . quinapril (ACCUPRIL) 20 MG tablet TAKE 1 TABLET BY MOUTH AT BEDTIME 90 tablet 1  . Tetrahyd-Glyc-Hypro-PEG-ZnSulf (VISINE TOTALITY MULTI-SYMPTOM OP) Apply 1 drop to eye daily as needed (dry eyes).    Marland Kitchen diclofenac sodium (VOLTAREN) 1 % GEL Apply 2 g topically 4 (four) times daily. 100 g 3   No current facility-administered medications for this visit.     Objective: BP 126/80 (BP Location: Left Arm, Patient Position: Sitting, Cuff Size: Large)   Pulse 73   Temp 97.8 F (36.6 C) (Oral)   Ht 5' 7" (1.702 m)   Wt 158 lb (71.7 kg)   SpO2 97%  BMI 24.75 kg/m  Gen: NAD, resting comfortably CV: RRR no murmurs rubs or gallops Lungs: CTAB no crackles, wheeze, rhonchi Abdomen: soft/nontender/nondistended Ext: no edema Skin: warm, dry Neuro: Speech normal, moves all extremities  Diabetic Foot Exam - Simple   Simple Foot Form Diabetic Foot exam was performed with the following findings:  Yes 11/08/2018 10:08 AM  Visual Inspection Standing no deformities, no ulcerations, no other skin breakdown bilaterally:  Yes Sensation Testing Intact to touch and monofilament testing bilaterally:  Yes Pulse Check Posterior Tibialis and Dorsalis pulse intact bilaterally:  Yes Comments Pain over sesamoid bones  on right foot- slight callus   Assessment/Plan:  Other notes: 1.no recent gout flares Lab Results  Component Value Date   LABURIC 4.4 07/09/2018    Diabetes mellitus type II, controlled ([place]) S:  controlled on metformin 500 AM and 1062m PM, glipizide 5 mg daily CBGs-  130s most times he checks fasting Lab Results  Component Value Date   HGBA1C 6.0 (A) 07/09/2018   HGBA1C 6.2 03/08/2018   HGBA1C 6.1 (H) 10/03/2017   A/P: hopefully stable, update a1c  Essential hypertension S: controlled on amlodipine 125mand quinapril 2059mP Readings from Last 3 Encounters:  11/08/18 126/80  07/25/18 125/79  07/09/18 134/76  A/P: We discussed blood pressure goal of <140/90. Continue current meds  Hyperlipidemia associated with type 2 diabetes mellitus ([place]: hopefully improved controlled on atorvastatin [id number]- has had to take half tablet - felt dizzy on full dose Lab Results  Component Value Date   CHOL 128 07/09/2018   HDL 38.40 (L) 07/09/2018   LDLCALC 74 07/09/2018   LDLDIRECT 96.0 03/08/2018   TRIG 77.0 07/09/2018   CHOLHDL 3 07/09/2018   A/P: will update cholesterol panel- likely only continue 5 mg atorvastatin due to dizziness on higher dose.   Sesamoiditis S: sesamoiditis- he does not want surgery. Has seen podiatry back in October.  A/P: really bothering him- will trial voltaren gel and needs to monitor the foot daily- if any issues/reaction to medicine needs to stop. I think voltaren gel is more ideal given diabetes/age and risk to kidneys of long term oral nsaids  4 to 6-mo82-monthow-up recommended  Lab/Order associations: Controlled type 2 diabetes mellitus without complication, without long-term current use of insulin (HCC) [place] [facility]: Comprehensive metabolic panel, Hemoglobin A1c, LDL cholesterol, direct, CBC with Differential/Platelet  Essential hypertension - Plan: Comprehensive metabolic panel, Hemoglobin A1c, LDL cholesterol, direct  Hyperlipidemia associated  with type 2 diabetes mellitus (HCC) [place]: Comprehensive metabolic panel, Hemoglobin A1c, LDL cholesterol, direct  Sesamoiditis  Meds ordered this encounter  Medications  . atorvastatin (LIPITOR) 10 MG tablet    Sig: Take 0.5 tablets (5 mg total) by mouth once a week.    Dispense:  7 tablet    Refill:  3  . diclofenac sodium (VOLTAREN) 1 % GEL    Sig: Apply 2 g topically 4 (four) times daily.    Dispense:  100 g    Refill:  3   Return precautions advised.  StephGarret Reddish

## 2018-11-08 NOTE — Assessment & Plan Note (Signed)
S:  controlled on metformin 500 AM and 1000mg  PM, glipizide 5 mg daily CBGs-  130s most times he checks fasting Lab Results  Component Value Date   HGBA1C 6.0 (A) 07/09/2018   HGBA1C 6.2 03/08/2018   HGBA1C 6.1 (H) 10/03/2017   A/P: hopefully stable, update a1c

## 2018-11-08 NOTE — Patient Instructions (Addendum)
Health Maintenance Due  Topic Date Due  . OPHTHALMOLOGY EXAM -called [facility] to get report faxed over, last seen 03/27/2017, Please call them at [phone] To schedule an appointment 03/27/2018  . FOOT EXAM -completed today 03/29/2018   Try voltaren gel for the foot to see if it helps. Tylenol is ok as well  No other changes planned today unless lab work leads [place] to make changes.   Please stop by lab before you go

## 2018-11-08 NOTE — Assessment & Plan Note (Signed)
S: hopefully improved controlled on atorvastatin 10mg  weekly- has had to take half tablet - felt dizzy on full dose Lab Results  Component Value Date   CHOL 128 07/09/2018   HDL 38.40 (L) 07/09/2018   LDLCALC 74 07/09/2018   LDLDIRECT 96.0 03/08/2018   TRIG 77.0 07/09/2018   CHOLHDL 3 07/09/2018   A/P: will update cholesterol panel- likely only continue 5 mg atorvastatin due to dizziness on higher dose.

## 2018-11-08 NOTE — Assessment & Plan Note (Signed)
S: controlled on amlodipine 10mg  and quinapril 20mg  BP Readings from Last 3 Encounters:  11/08/18 126/80  07/25/18 125/79  07/09/18 134/76  A/P: We discussed blood pressure goal of <140/90. Continue current meds

## 2018-11-15 ENCOUNTER — Other Ambulatory Visit: Payer: Self-pay | Admitting: Family Medicine

## 2018-11-15 MED ORDER — GLUCOSE BLOOD VI STRP: ORAL_STRIP | 12 refills | Status: DC

## 2018-11-18 ENCOUNTER — Other Ambulatory Visit: Payer: Self-pay | Admitting: Family Medicine

## 2018-11-18 LAB — HM DIABETES EYE EXAM

## 2018-11-22 ENCOUNTER — Ambulatory Visit: Payer: Medicare Other | Admitting: Podiatry

## 2018-11-22 DIAGNOSIS — M6240 Contracture of muscle, unspecified site: Secondary | ICD-10-CM

## 2018-11-22 DIAGNOSIS — M7751 Other enthesopathy of right foot: Secondary | ICD-10-CM | POA: Diagnosis not present

## 2018-11-22 DIAGNOSIS — M205X1 Other deformities of toe(s) (acquired), right foot: Secondary | ICD-10-CM | POA: Diagnosis not present

## 2018-11-22 DIAGNOSIS — M2580 Other specified joint disorders, unspecified joint: Secondary | ICD-10-CM

## 2018-11-22 DIAGNOSIS — N400 Enlarged prostate without lower urinary tract symptoms: Secondary | ICD-10-CM | POA: Insufficient documentation

## 2018-11-23 NOTE — Progress Notes (Signed)
Subjective:  Patient ID: Preston Pennington, male    DOB: 09/20/1951,  MRN: [id number]  Chief Complaint  Patient presents with  . Foot Pain    Capsulitis, Right, Hallux IPJ sesamoid R. (3Wk) - becoming painful again -callus is building up again    68 y.o. male presents with the above complaint.  Did not schedule surgery was having second thoughts about going to the procedure.  Review of Systems: Negative except as noted in the HPI. Denies N/V/F/Ch.  Past Medical History:  Diagnosis Date  . Diabetes mellitus without complication ([place])   . Difficult intubation    "mouth was not wide enough"  . Hypertension   . Prostate enlargement 2010   See's Urologist     Current Outpatient Medications:  .  acetaminophen (TYLENOL) 500 MG tablet, Take 1,000 mg by mouth every 6 (six) hours as needed for moderate pain. , Disp: , Rfl:  .  allopurinol (ZYLOPRIM) 100 MG tablet, TAKE 1 TABLET BY MOUTH ONCE DAILY, Disp: 90 tablet, Rfl: 3 .  amLODipine (NORVASC) 10 MG tablet, Take 1 tablet (10 mg total) by mouth daily., Disp: 90 tablet, Rfl: 3 .  atorvastatin (LIPITOR) 10 MG tablet, Take 0.5 tablets (5 mg total) by mouth once a week., Disp: 7 tablet, Rfl: 3 .  blood glucose meter kit and supplies KIT, Dispense based on patient and insurance preference. Use 2 times daily as directed. E11.9 Accu-Check [employer], Disp: 1 each, Rfl: 0 .  Camphor-Eucalyptus-Menthol (VICKS VAPORUB EX), Apply 1 application topically at bedtime as needed (pain/ congestions)., Disp: , Rfl:  .  diclofenac sodium (VOLTAREN) 1 % GEL, Apply 2 g topically 4 (four) times daily., Disp: 100 g, Rfl: 3 .  feeding supplement, ENSURE ENLIVE, (ENSURE ENLIVE) LIQD, Take 1 Bottle by mouth daily., Disp: , Rfl:  .  gabapentin (NEURONTIN) 300 MG capsule, Take 1 capsule, orally, QID or as directed, Disp: 360 capsule, Rfl: 0 .  glipiZIDE (GLUCOTROL) 5 MG tablet, TAKE ONE TABLET BY MOUTH ONCE DAILY BEFORE BREAKFAST, Disp: 90 tablet, Rfl: 3 .  glucose  blood (ACCU-CHEK SMARTVIEW) test strip, Use to test blood sugar daily E11.9, Disp: 100 each, Rfl: 3 .  metFORMIN (GLUCOPHAGE) 1000 MG tablet, TAKE 1 TABLET BY MOUTH TWICE DAILY WITH MEALS, Disp: 180 tablet, Rfl: 1 .  MITIGARE 0.6 MG CAPS, TAKE 2 TABLETS BY MOUTH AT THE FIRST SIGN OF GOUT THEN 1 PILL 2 HOURS LATER THEN 1 PILL DAILY UNTIL FLARE IS RESOLVED, Disp: 30 capsule, Rfl: 3 .  ONETOUCH DELICA LANCETS FINE MISC, Use to check blood sugar once a day. E11.9, Disp: 90 each, Rfl: 3 .  quinapril (ACCUPRIL) 20 MG tablet, TAKE 1 TABLET BY MOUTH AT BEDTIME, Disp: 90 tablet, Rfl: 1 .  Tetrahyd-Glyc-Hypro-PEG-ZnSulf (VISINE TOTALITY MULTI-SYMPTOM OP), Apply 1 drop to eye daily as needed (dry eyes)., Disp: , Rfl:   Social History   Tobacco Use  Smoking Status Never Smoker  Smokeless Tobacco Never Used    No Known Allergies Objective:   There were no vitals filed for this visit. There is no height or weight on file to calculate BMI. Constitutional Well developed. Well nourished.  Vascular Dorsalis pedis pulses palpable bilaterally. Posterior tibial pulses palpable bilaterally. Capillary refill normal to all digits.  No cyanosis or clubbing noted. Pedal hair growth normal.  Neurologic Normal speech. Oriented to person, place, and time. Epicritic sensation to light touch grossly present bilaterally.  Dermatologic Nails well groomed and normal in appearance. No open wounds.  Hyperkeratosis hallux IPJ right, first MPJ right  Orthopedic: Normal joint ROM without pain or crepitus bilaterally. Hallux extensors noted right.  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 Pain to palpation about the hallux IPJ right Pain palpation about the right hallux IPJ and first metatarsal plantarly    Radiographs:  9/26: Hallux IPJ sesamoid noted.  No acute fractures dislocations Assessment:   1. Sesamoiditis   2. Tendon contracture   3. Hallux extensus, acquired, right   4. Capsulitis of  metatarsophalangeal (MTP) joint of right foot    Plan:  Patient was evaluated and treated and all questions answered.  Right hallux sesamoid and capsulitis of the first MPJ -Likely residual hallux extensors contracture from checkrein deformity at the ankle. -Once again discussed he would benefit from excision of the hallux IPJ sesamoid and extensor tendon lengthening to prevent the pain he is receiving at the bottom of the great toe. Will plan for distal lengthening to avoid the scar tissue at the ankle -Post-op course and surgical plan discussed. -Patient has failed all conservative therapy and wishes to proceed with surgical intervention. All risks, benefits, and alternatives discussed with patient. No guarantees given. Consent reviewed and signed by patient. -Planned procedures: Excision of sesamoid with extensor tendon lengthening. -Patient had hold off surgical plan.  Reeducated on benefits of surgery and that the deformity is unlikely to reduce without surgical intervention.  Advised patient call should he wish to schedule surgery.  No follow-ups on file.

## 2018-12-02 ENCOUNTER — Telehealth: Payer: Self-pay | Admitting: Family Medicine

## 2018-12-02 ENCOUNTER — Encounter: Payer: Self-pay | Admitting: Family Medicine

## 2018-12-02 NOTE — Telephone Encounter (Signed)
Copied from [place] [id number]. Topic: Quick Communication - Rx Refill/Question >> Dec 02, 2018  3:52 PM [employer] [place] D wrote: Medication: blood glucose meter kit and supplies KIT / pt stated the pharmacy sent over a fax stating they need to know how many times a day pt is to use his glucose meter in order to fill rx for pt. Please advise so pt may pick up his meter.   Has the patient contacted their pharmacy? Yes.   (Agent: If no, request that the patient contact the pharmacy for the refill.) (Agent: If yes, when and what did the pharmacy advise?)  Preferred Pharmacy (with phone number or street name): [facility], [place]. [phone] (Phone) [phone] (Fax)  Agent: Please be advised that RX refills may take up to 3 business days. We ask that you follow-up with your pharmacy.

## 2018-12-02 NOTE — Telephone Encounter (Signed)
See note

## 2018-12-03 NOTE — Telephone Encounter (Signed)
Per the prescription patient is testing 2 times a day. [facility] and spoke with staff. She states that he only has Part A & D and his insurance wants a Prior Authorization done to cover his strips. I will file prior authorization.

## 2018-12-04 NOTE — Telephone Encounter (Signed)
Prior Authorization was filed yesterday. Pending approval

## 2018-12-23 ENCOUNTER — Ambulatory Visit (INDEPENDENT_AMBULATORY_CARE_PROVIDER_SITE_OTHER): Payer: Medicaid Other | Admitting: Physician Assistant

## 2018-12-23 ENCOUNTER — Encounter: Payer: Self-pay | Admitting: Physician Assistant

## 2018-12-23 VITALS — BP 140/80 | HR 91 | Temp 98.8°F | Ht 67.0 in | Wt 164.2 lb

## 2018-12-23 DIAGNOSIS — J101 Influenza due to other identified influenza virus with other respiratory manifestations: Secondary | ICD-10-CM

## 2018-12-23 LAB — POCT INFLUENZA A/B
Influenza A, POC: POSITIVE — AB
Influenza B, POC: NEGATIVE

## 2018-12-23 MED ORDER — GLUCOSE BLOOD VI STRP: ORAL_STRIP | 4 refills | Status: DC

## 2018-12-23 MED ORDER — OSELTAMIVIR PHOSPHATE 75 MG PO CAPS: 75.0000 mg | ORAL_CAPSULE | Freq: Two times a day (BID) | ORAL | 0 refills | Status: DC

## 2018-12-23 NOTE — Progress Notes (Signed)
Preston Pennington is a 68 y.o. male here for a new problem.  SCRIBE STATEMENT  History of Present Illness:   Chief Complaint  Patient presents with  . Cough    x 1week  . Running nose    x 1week  . Sore Throat    only for a few days last week    Influenza  This is a new problem. The current episode started in the past 7 days. The problem has been gradually improving. Associated symptoms include arthralgias, chills, congestion, coughing, fatigue and a sore throat. Pertinent negatives include no abdominal pain, anorexia, change in bowel habit, chest pain, diaphoresis, fever, headaches, joint swelling, myalgias, nausea, neck pain, numbness, rash, swollen glands, urinary symptoms, vertigo, visual change, vomiting or weakness. Nothing aggravates the symptoms. Treatments tried: OTC cough suppressant. The treatment provided mild relief.   He states that he did receive a flu vaccine this season.  Past Medical History:  Diagnosis Date  . Diabetes mellitus without complication ([place])   . Difficult intubation    "mouth was not wide enough"  . Hypertension   . Prostate enlargement 2010   See's Urologist      Social History   Socioeconomic History  . Marital status: Married    Spouse name: Not on file  . Number of children: Not on file  . Years of education: Not on file  . Highest education level: Not on file  Occupational History  . Not on file  Social Needs  . Financial resource strain: Not on file  . Food insecurity:    Worry: Not on file    Inability: Not on file  . Transportation needs:    Medical: Not on file    Non-medical: Not on file  Tobacco Use  . Smoking status: Never Smoker  . Smokeless tobacco: Never Used  Substance and Sexual Activity  . Alcohol use: No    Alcohol/week: 0.0 standard drinks  . Drug use: No  . Sexual activity: Not on file  Lifestyle  . Physical activity:    Days per week: Not on file    Minutes per session: Not on file  . Stress: Not on file   Relationships  . Social connections:    Talks on phone: Not on file    Gets together: Not on file    Attends religious service: Not on file    Active member of club or organization: Not on file    Attends meetings of clubs or organizations: Not on file    Relationship status: Not on file  . Intimate partner violence:    Fear of current or ex partner: Not on file    Emotionally abused: Not on file    Physically abused: Not on file    Forced sexual activity: Not on file  Other Topics Concern  . Not on file  Social History Narrative   Married. 4 children. 5 grandkids- new grandchild 10/2015 included.    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       Hobbies: enjoys reading novels. Enjoys African authors   Goes to [employer] ([employer])    Past Surgical History:  Procedure Laterality Date  . COLONOSCOPY WITH PROPOFOL N/A 11/02/2016   Procedure: COLONOSCOPY WITH PROPOFOL;  Surgeon: Gatha Mayer, MD;  Location: [facility];  Service: Endoscopy;  Laterality: N/A;  . LEG SURGERY Right 1997  accident related  . XI ROBOTIC ASSISTED SIMPLE PROSTATECTOMY N/A 10/05/2017   Procedure: XI ROBOTIC ASSISTED SIMPLE PROSTATECTOMY WITH UMBILICAL HERNIA REPAIR;  Surgeon: Cleon Gustin, MD;  Location: [facility];  Service: Urology;  Laterality: N/A;    Family History  Problem Relation Age of Onset  . Other Father        and mother- states died of old age. States no medical problems in parents or siblings  . Colon cancer Neg Hx     No Known Allergies  Current Medications:   Current Outpatient Medications:  .  acetaminophen (TYLENOL) 500 MG tablet, Take 1,000 mg by mouth every 6 (six) hours as needed for moderate pain. , Disp: , Rfl:  .  allopurinol (ZYLOPRIM) 100 MG tablet, TAKE 1 TABLET BY MOUTH ONCE DAILY, Disp: 90 tablet, Rfl: 3 .  amLODipine (NORVASC) 10 MG tablet, Take 1 tablet (10 mg  total) by mouth daily., Disp: 90 tablet, Rfl: 3 .  atorvastatin (LIPITOR) 10 MG tablet, Take 0.5 tablets (5 mg total) by mouth once a week., Disp: 7 tablet, Rfl: 3 .  blood glucose meter kit and supplies KIT, Dispense based on patient and insurance preference. Use 2 times daily as directed. E11.9 Accu-Check [employer], Disp: 1 each, Rfl: 0 .  Camphor-Eucalyptus-Menthol (VICKS VAPORUB EX), Apply 1 application topically at bedtime as needed (pain/ congestions)., Disp: , Rfl:  .  dextromethorphan-guaiFENesin (TUSSIN DM) 10-100 MG/5ML liquid, Take 10 mLs by mouth every 6 (six) hours as needed for cough., Disp: , Rfl:  .  diclofenac sodium (VOLTAREN) 1 % GEL, Apply 2 g topically 4 (four) times daily., Disp: 100 g, Rfl: 3 .  feeding supplement, ENSURE ENLIVE, (ENSURE ENLIVE) LIQD, Take 1 Bottle by mouth daily., Disp: , Rfl:  .  gabapentin (NEURONTIN) 300 MG capsule, Take 1 capsule, orally, QID or as directed, Disp: 360 capsule, Rfl: 0 .  glipiZIDE (GLUCOTROL) 5 MG tablet, TAKE ONE TABLET BY MOUTH ONCE DAILY BEFORE BREAKFAST, Disp: 90 tablet, Rfl: 3 .  glucose blood (ACCU-CHEK SMARTVIEW) test strip, Use to test blood sugar daily and prn, Disp: 100 each, Rfl: 4 .  metFORMIN (GLUCOPHAGE) 1000 MG tablet, TAKE 1 TABLET BY MOUTH TWICE DAILY WITH MEALS, Disp: 180 tablet, Rfl: 1 .  MITIGARE 0.6 MG CAPS, TAKE 2 TABLETS BY MOUTH AT THE FIRST SIGN OF GOUT THEN 1 PILL 2 HOURS LATER THEN 1 PILL DAILY UNTIL FLARE IS RESOLVED, Disp: 30 capsule, Rfl: 3 .  ONETOUCH DELICA LANCETS FINE MISC, Use to check blood sugar once a day. E11.9, Disp: 90 each, Rfl: 3 .  quinapril (ACCUPRIL) 20 MG tablet, TAKE 1 TABLET BY MOUTH AT BEDTIME, Disp: 90 tablet, Rfl: 1 .  oseltamivir (TAMIFLU) 75 MG capsule, Take 1 capsule (75 mg total) by mouth 2 (two) times daily., Disp: 10 capsule, Rfl: 0 .  Tetrahyd-Glyc-Hypro-PEG-ZnSulf (VISINE TOTALITY MULTI-SYMPTOM OP), Apply 1 drop to eye daily as needed (dry eyes)., Disp: , Rfl:    Review of  Systems:   Review of Systems  Constitutional: Positive for chills and fatigue. Negative for diaphoresis and fever.  HENT: Positive for congestion and sore throat.   Respiratory: Positive for cough.   Cardiovascular: Negative for chest pain.  Gastrointestinal: Negative for abdominal pain, anorexia, change in bowel habit, nausea and vomiting.  Musculoskeletal: Positive for arthralgias. Negative for joint swelling, myalgias and neck pain.  Skin: Negative for rash.  Neurological: Negative for vertigo, weakness, numbness and headaches.    Vitals:  Vitals:   12/23/18 0830  BP: 140/80  Pulse: 91  Temp: 98.8 F (37.1 C)  TempSrc: Oral  SpO2: 96%  Weight: 164 lb 4 oz (74.5 kg)  Height: _0  (1.702 m)     Body mass index is 25.73 kg/m.  Physical Exam:   Physical Exam Vitals signs and nursing note reviewed.  Constitutional:      General: He is not in acute distress.    Appearance: He is well-developed. He is not ill-appearing or toxic-appearing.  HENT:     Head: Normocephalic and atraumatic.     Right Ear: Tympanic membrane, ear canal and external ear normal. Tympanic membrane is not erythematous, retracted or bulging.     Left Ear: Tympanic membrane, ear canal and external ear normal. Tympanic membrane is not erythematous, retracted or bulging.     Ears:     Comments: Trace fluid behind bilateral TM    Nose: Nose normal.     Right Sinus: No maxillary sinus tenderness or frontal sinus tenderness.     Left Sinus: No maxillary sinus tenderness or frontal sinus tenderness.     Mouth/Throat:     Lips: Pink.     Mouth: Mucous membranes are moist.     Pharynx: Uvula midline. Posterior oropharyngeal erythema present.     Tonsils: Swelling: 1+ on the right. 1+ on the left.  Eyes:     General: Lids are normal.     Conjunctiva/sclera: Conjunctivae normal.  Neck:     Trachea: Trachea normal.  Cardiovascular:     Rate and Rhythm: Normal rate and regular rhythm.     Pulses: Normal  pulses.     Heart sounds: Normal heart sounds, S1 normal and S2 normal.     Comments: No LE edema Pulmonary:     Effort: Pulmonary effort is normal.     Breath sounds: Normal breath sounds. No decreased breath sounds, wheezing, rhonchi or rales.  Lymphadenopathy:     Cervical: No cervical adenopathy.  Skin:    General: Skin is warm and dry.  Neurological:     Mental Status: He is alert.     GCS: GCS eye subscore is 4. GCS verbal subscore is 5. GCS motor subscore is 6.  Psychiatric:        Speech: Speech normal.        Behavior: Behavior normal. Behavior is cooperative.     Results for orders placed or performed in visit on 12/23/18  POCT Influenza A/B  Result Value Ref Range   Influenza A, POC Positive (A) Negative   Influenza B, POC Negative Negative    Assessment and Plan:   Dionte was seen today for cough, running nose and sore throat.  Diagnoses and all orders for this visit:  Influenza A -     POCT Influenza A/B  Other orders -     glucose blood (ACCU-CHEK SMARTVIEW) test strip; Use to test blood sugar daily and prn -     oseltamivir (TAMIFLU) 75 MG capsule; Take 1 capsule (75 mg total) by mouth 2 (two) times daily.   No red flags on exam.  Will initiate tamiflu per orders. Discussed possible side effects of medication. Discussed taking medications as prescribed. Reviewed return precautions including worsening fever, SOB, worsening cough or other concerns. Push fluids and rest. I recommend that patient follow-up if symptoms worsen or persist despite treatment x 7-10 days, sooner if needed.  . Reviewed expectations re: course of current medical issues. . Discussed self-management  of symptoms. . Outlined signs and symptoms indicating need for more acute intervention. . Patient verbalized understanding and all questions were answered. . See orders for this visit as documented in the electronic medical record. . Patient received an After-Visit Summary.  CMA or LPN  served as scribe during this visit. History, Physical, and Plan performed by medical provider. The above documentation has been reviewed and is accurate and complete.   Inda Coke, PA-C

## 2018-12-23 NOTE — Patient Instructions (Signed)

## 2018-12-24 ENCOUNTER — Telehealth: Payer: Self-pay | Admitting: Family Medicine

## 2018-12-24 ENCOUNTER — Encounter: Payer: Self-pay | Admitting: *Deleted

## 2018-12-24 NOTE — Telephone Encounter (Signed)
Pt came by office for work excuse.Work excuse done, letter printed and given to pt.

## 2018-12-24 NOTE — Telephone Encounter (Signed)
Copied from [place] [id number]. Topic: General - Other >> Dec 24, 2018 11:05 AM Sheran Luz wrote: Reason for CRM: Patient called requesting an extended work note for today and tomorrow. Patient was seen 2/24 and diagnosed with influenza A. Patient is requesting a call back when note is ready. Please advise.

## 2019-01-03 ENCOUNTER — Telehealth: Payer: Self-pay

## 2019-01-03 MED ORDER — QUINAPRIL HCL 20 MG PO TABS: 20.0000 mg | ORAL_TABLET | Freq: Every day | ORAL | 1 refills | Status: DC

## 2019-01-03 NOTE — Telephone Encounter (Signed)
rx sent electronically 90 days

## 2019-01-05 ENCOUNTER — Other Ambulatory Visit: Payer: Self-pay | Admitting: Family Medicine

## 2019-02-05 ENCOUNTER — Other Ambulatory Visit: Payer: Self-pay

## 2019-02-05 MED ORDER — QUINAPRIL HCL 20 MG PO TABS: 20.0000 mg | ORAL_TABLET | Freq: Every day | ORAL | 1 refills | Status: DC

## 2019-02-05 NOTE — Telephone Encounter (Signed)
Pharmacy request.  [facility]. Quinapril 20mg  #90/1

## 2019-02-06 ENCOUNTER — Other Ambulatory Visit: Payer: Self-pay | Admitting: Family Medicine

## 2019-02-06 MED ORDER — ATORVASTATIN CALCIUM 10 MG PO TABS: 5.0000 mg | ORAL_TABLET | ORAL | 3 refills | Status: DC

## 2019-02-07 ENCOUNTER — Other Ambulatory Visit: Payer: Self-pay | Admitting: Family Medicine

## 2019-03-03 ENCOUNTER — Other Ambulatory Visit: Payer: Self-pay | Admitting: Family Medicine

## 2019-03-10 ENCOUNTER — Telehealth (INDEPENDENT_AMBULATORY_CARE_PROVIDER_SITE_OTHER): Payer: Medicaid Other | Admitting: Family Medicine

## 2019-03-10 ENCOUNTER — Encounter: Payer: Self-pay | Admitting: Family Medicine

## 2019-03-10 VITALS — BP 124/76 | HR 79 | Ht 67.0 in | Wt 155.0 lb

## 2019-03-10 DIAGNOSIS — E785 Hyperlipidemia, unspecified: Secondary | ICD-10-CM

## 2019-03-10 DIAGNOSIS — M1A09X0 Idiopathic chronic gout, multiple sites, without tophus (tophi): Secondary | ICD-10-CM

## 2019-03-10 DIAGNOSIS — D696 Thrombocytopenia, unspecified: Secondary | ICD-10-CM

## 2019-03-10 DIAGNOSIS — E1169 Type 2 diabetes mellitus with other specified complication: Secondary | ICD-10-CM | POA: Diagnosis not present

## 2019-03-10 DIAGNOSIS — I10 Essential (primary) hypertension: Secondary | ICD-10-CM | POA: Diagnosis not present

## 2019-03-10 DIAGNOSIS — E119 Type 2 diabetes mellitus without complications: Secondary | ICD-10-CM

## 2019-03-10 MED ORDER — ATORVASTATIN CALCIUM 10 MG PO TABS: 10.0000 mg | ORAL_TABLET | ORAL | 3 refills | Status: DC

## 2019-03-10 NOTE — Patient Instructions (Addendum)
Health Maintenance Due  Topic Date Due  . OPHTHALMOLOGY EXAM Pt has an appt this month. Pt was told to sign a ROI to send eye exam results to our office  03/27/2018  . PNA vac Low Risk Adult (2 of 2 - PPSV23) Please stop by your local pharmacy when Covid-19 calms. Could also potentially give in office here- not sure about medicaid coverage though 02/13/2019    Depression screen [facility] 2/9 11/08/2018 07/09/2018 03/16/2017  Decreased Interest 0 0 0  Down, Depressed, Hopeless 0 0 0  PHQ - 2 Score 0 0 0   Video visit

## 2019-03-10 NOTE — Progress Notes (Addendum)
Phone [phone]   Subjective:  Virtual visit via Video note. Chief complaint: Chief Complaint  Patient presents with  . Diabetes    4 month f/u    This visit type was conducted due to national recommendations for restrictions regarding the COVID-19 Pandemic (e.g. social distancing).  This format is felt to be most appropriate for this patient at this time balancing risks to patient and risks to population by having him in for in person visit.  No physical exam was performed (except for noted visual exam or audio findings with Telehealth visits).    Our team/I connected with Loma Messing on 03/10/19 at  9:40 AM EDT by a video enabled telemedicine application ([email or web address]) and verified that I am speaking with the correct person using two identifiers.  Location patient: Home-[place] Location provider: [facility], office Persons participating in the virtual visit:  patient  Our team/I discussed the limitations of evaluation and management by telemedicine and the availability of in person appointments. In light of current covid-19 pandemic, patient also understands that we are trying to protect them by minimizing in office contact if at all possible.  The patient expressed consent for telemedicine visit and agreed to proceed. Patient understands insurance will be billed.   ROS-no fever, chills, cough, congestion, sore throat, body aches  Past Medical History-  Patient Active Problem List   Diagnosis Date Noted  . Diabetes mellitus type II, controlled ([place]) 04/09/2013    Priority: High  . Hyperlipidemia associated with type 2 diabetes mellitus ([place]) 07/09/2018    Priority: Medium  . Left cervical radiculopathy 09/24/2017    Priority: Medium  . History of adenomatous polyp of colon 11/06/2016    Priority: Medium  . Chronic ankle pain 06/14/2016    Priority: Medium  . Thrombocytopenia ([place]) 11/29/2015    Priority: Medium  . Gout 04/30/2015    Priority: Medium  . Essential hypertension  04/09/2013    Priority: Medium  . Osteoarthritis of spine with radiculopathy, cervical region 11/14/2017    Priority: Low  . Right knee pain 11/21/2016    Priority: Low  . Erectile dysfunction 02/04/2015    Priority: Low  . BPH (benign prostatic hyperplasia) 04/09/2013    Priority: Low  . Prostate enlargement 11/22/2018  . Sesamoiditis 11/08/2018    Medications- reviewed and updated Current Outpatient Medications  Medication Sig Dispense Refill  . allopurinol (ZYLOPRIM) 100 MG tablet TAKE 1 TABLET BY MOUTH ONCE DAILY 90 tablet 3  . amLODipine (NORVASC) 10 MG tablet Take 1 tablet (10 mg total) by mouth daily. (Patient taking differently: Take 10 mg by mouth once a week. ) 90 tablet 3  . atorvastatin (LIPITOR) 10 MG tablet Take 0.5 tablets (5 mg total) by mouth once a week. (Patient taking differently: Take 10 mg by mouth once a week. ) 7 tablet 3  . blood glucose meter kit and supplies KIT Dispense based on patient and insurance preference. Use 2 times daily as directed. E11.9 Accu-Check Smart View Meter 1 each 0  . Camphor-Eucalyptus-Menthol (VICKS VAPORUB EX) Apply 1 application topically at bedtime as needed (pain/ congestions).    Marland Kitchen dextromethorphan-guaiFENesin (TUSSIN DM) 10-100 MG/5ML liquid Take 10 mLs by mouth every 6 (six) hours as needed for cough.    . diclofenac sodium (VOLTAREN) 1 % GEL Apply 2 g topically 4 (four) times daily. 100 g 3  . feeding supplement, ENSURE ENLIVE, (ENSURE ENLIVE) LIQD Take 1 Bottle by mouth daily.    Marland Kitchen gabapentin (NEURONTIN) 300 MG capsule  Take 1 capsule, orally, QID or as directed (Patient taking differently: 300 mg. Take 1 capsule, orally, QID or as directed) 360 capsule 0  . glipiZIDE (GLUCOTROL) 5 MG tablet TAKE ONE TABLET BY MOUTH ONCE DAILY BEFORE BREAKFAST 90 tablet 3  . glucose blood (ACCU-CHEK SMARTVIEW) test strip Use to test blood sugar daily and prn 100 each 4  . metFORMIN (GLUCOPHAGE) 1000 MG tablet TAKE 1 TABLET BY MOUTH TWICE DAILY WITH  MEALS 180 tablet 1  . MITIGARE 0.6 MG CAPS TAKE 2 CAPSULES BY MOUTH AT THE FIRST SIGN OF GOUT THEN 1 CAPSULE 2 HOURS LATER THEN 1 CAPSULE ONCE DAILY UNTIL FLARE IS RESOLVED. 30 capsule 0  . ONETOUCH DELICA LANCETS FINE MISC Use to check blood sugar once a day. E11.9 90 each 3  . quinapril (ACCUPRIL) 20 MG tablet Take 1 tablet (20 mg total) by mouth at bedtime. 90 tablet 1  . Tetrahyd-Glyc-Hypro-PEG-ZnSulf (VISINE TOTALITY MULTI-SYMPTOM OP) Apply 1 drop to eye daily as needed (dry eyes).    Marland Kitchen acetaminophen (TYLENOL) 500 MG tablet Take 1,000 mg by mouth every 6 (six) hours as needed for moderate pain.      No current facility-administered medications for this visit.      Objective:  BP 124/76   Pulse 79   Ht '5\' 7"'  (1.702 m)   Wt 155 lb (70.3 kg)   BMI 24.28 kg/m  self reported vitals Gen: NAD, resting comfortably Lungs: nonlabored, normal respiratory rate  Skin: appears dry, no obvious rash     Assessment and Plan   # Diabetes S: previosuly controlled on metformin 58m in AM and 10018min PM, glipizide 5 mg daily CBGs- running 130s in AM still  Lab Results  Component Value Date   HGBA1C 5.9 11/08/2018   HGBA1C 6.0 (A) 07/09/2018   HGBA1C 6.2 03/08/2018   A/P: hopefully stable- will come by for updated a1c and other labs  #hypertension S: controlled on amlodipine 1030mnd quinapril 48m40m Readings from Last 3 Encounters:  03/10/19 124/76  12/23/18 140/80  11/08/18 126/80  A/P:  Stable. Continue current medications.    #hyperlipidemia S: mild poorly controlled on atorvastatin 10mg87mf tablet weekly- feels dizzy on full dose on prior attempts-fortunately he has retrial of this and not had recurrent issues Lab Results  Component Value Date   CHOL 128 07/09/2018   HDL 38.40 (L) 07/09/2018   LDLCALC 74 07/09/2018   LDLDIRECT 108.0 11/08/2018   TRIG 77.0 07/09/2018   CHOLHDL 3 07/09/2018   A/P: We will reassess control now on atorvastatin 10 mg full tablet weekly-  hopeful for LDL under 100  # thrombocytopenia S:has been stable around or slightly above 100. Rest of cell lines ok  A/P: Hopefully stable- update CBC with labs  #Gout S: no flares in 4 months  on allopurinol 100mg.76m not had to use mitigare lately (some confusion on if he is using this for sesamoiditis) Lab Results  Component Value Date   LABURIC 4.4 07/09/2018  A/P: Stable problem- reporting issues with sesamoiditis but this does not sound like gout flareups-continue current medications  He will call to schedule in person labs- he is aware he needs to be COVID-19 symptom-free and wear a mask 4 months in person hopefully Lab/Order associations: Controlled type 2 diabetes mellitus without complication, without long-term current use of insulin (HCC) -[place]: Hemoglobin A1c, CBC with Differential/Platelet, Comprehensive metabolic panel, LDL cholesterol, direct  Essential hypertension  Hyperlipidemia associated with type 2 diabetes  mellitus ([place])  Thrombocytopenia (HCC)  Idiopathic chronic gout of multiple sites without tophus  Meds ordered this encounter  Medications  . atorvastatin (LIPITOR) 10 MG tablet    Sig: Take 1 tablet (10 mg total) by mouth once a week.    Dispense:  13 tablet    Refill:  3   Return precautions advised.  Garret Reddish, MD

## 2019-03-10 NOTE — Assessment & Plan Note (Signed)
S: mild poorly controlled on atorvastatin 10mg  half tablet weekly- feels dizzy on full dose on prior attempts-fortunately he has retrial of this and not had recurrent issues Lab Results  Component Value Date   CHOL 128 07/09/2018   HDL 38.40 (L) 07/09/2018   LDLCALC 74 07/09/2018   LDLDIRECT 108.0 11/08/2018   TRIG 77.0 07/09/2018   CHOLHDL 3 07/09/2018   A/P: We will reassess control now on atorvastatin 10 mg full tablet weekly- hopeful for LDL under 100-may be able to try twice a week since he is doing well on current dose if needed in the future

## 2019-03-10 NOTE — Assessment & Plan Note (Signed)
S: no flares in 4 months  on allopurinol 100mg . Has not had to use mitigare lately (some confusion on if he is using this for sesamoiditis) Lab Results  Component Value Date   LABURIC 4.4 07/09/2018  A/P: Stable problem- reporting issues with sesamoiditis but this does not sound like gout flareups-continue current medications

## 2019-03-11 ENCOUNTER — Other Ambulatory Visit (INDEPENDENT_AMBULATORY_CARE_PROVIDER_SITE_OTHER): Payer: Medicaid Other

## 2019-03-11 DIAGNOSIS — E119 Type 2 diabetes mellitus without complications: Secondary | ICD-10-CM | POA: Diagnosis not present

## 2019-03-11 LAB — COMPREHENSIVE METABOLIC PANEL
ALT: 19 U/L (ref 0–53)
AST: 15 U/L (ref 0–37)
Albumin: 4.1 g/dL (ref 3.5–5.2)
Alkaline Phosphatase: 69 U/L (ref 39–117)
BUN: 11 mg/dL (ref 6–23)
CO2: 27 mEq/L (ref 19–32)
Calcium: 8.9 mg/dL (ref 8.4–10.5)
Chloride: 103 mEq/L (ref 96–112)
Creatinine, Ser: 0.89 mg/dL (ref 0.40–1.50)
GFR: 102.67 mL/min (ref >60.00)
Glucose, Bld: 193 mg/dL — ABNORMAL HIGH (ref 70–99)
Potassium: 4 mEq/L (ref 3.5–5.1)
Sodium: 138 mEq/L (ref 135–145)
Total Bilirubin: 0.5 mg/dL (ref 0.2–1.2)
Total Protein: 6.4 g/dL (ref 6.0–8.3)

## 2019-03-11 LAB — CBC WITH DIFFERENTIAL/PLATELET
Basophils Absolute: 0 10*3/uL (ref 0.0–0.1)
Basophils Relative: 0.8 % (ref 0.0–3.0)
Eosinophils Absolute: 0.3 10*3/uL (ref 0.0–0.7)
Eosinophils Relative: 7.4 % — ABNORMAL HIGH (ref 0.0–5.0)
HCT: 43.4 % (ref 39.0–52.0)
Hemoglobin: 14.2 g/dL (ref 13.0–17.0)
Lymphocytes Relative: 48.6 % — ABNORMAL HIGH (ref 12.0–46.0)
Lymphs Abs: 1.7 10*3/uL (ref 0.7–4.0)
MCHC: 32.6 g/dL (ref 30.0–36.0)
MCV: 81 fl (ref 78.0–100.0)
Monocytes Absolute: 0.3 10*3/uL (ref 0.1–1.0)
Monocytes Relative: 8.2 % (ref 3.0–12.0)
Neutro Abs: 1.2 10*3/uL — ABNORMAL LOW (ref 1.4–7.7)
Neutrophils Relative %: 35 % — ABNORMAL LOW (ref 43.0–77.0)
Platelets: 91 10*3/uL — ABNORMAL LOW (ref 150.0–400.0)
RBC: 5.36 Mil/uL (ref 4.22–5.81)
RDW: 13.8 % (ref 11.5–15.5)
WBC: 3.6 10*3/uL — ABNORMAL LOW (ref 4.0–10.5)

## 2019-03-11 LAB — LDL CHOLESTEROL, DIRECT: Direct LDL: 92 mg/dL

## 2019-03-11 LAB — HEMOGLOBIN A1C: Hgb A1c MFr Bld: 6.5 % (ref 4.6–6.5)

## 2019-03-12 ENCOUNTER — Other Ambulatory Visit: Payer: Self-pay

## 2019-03-12 DIAGNOSIS — E119 Type 2 diabetes mellitus without complications: Secondary | ICD-10-CM

## 2019-03-31 DIAGNOSIS — H4089 Other specified glaucoma: Secondary | ICD-10-CM

## 2019-03-31 HISTORY — DX: Other specified glaucoma: H40.89

## 2019-03-31 LAB — HM DIABETES EYE EXAM

## 2019-04-06 ENCOUNTER — Other Ambulatory Visit: Payer: Self-pay | Admitting: Family Medicine

## 2019-04-23 ENCOUNTER — Encounter: Payer: Self-pay | Admitting: Physical Therapy

## 2019-05-07 IMAGING — MR MR CERVICAL SPINE W/O CM
5 series · 29 of 48 positions shown · non-contrast
Comparison: None.

CLINICAL DATA: Left cervical radiculopathy

EXAM:
MRI CERVICAL SPINE WITHOUT CONTRAST
TECHNIQUE: Multiplanar, multisequence MR imaging of the cervical spine was
performed. No intravenous contrast was administered.

[Series 6: T2 · sagittal · 3.0mm · 0.41mm/px · 6 of 15 slices shown (1 of 2)]
[im 1/15]
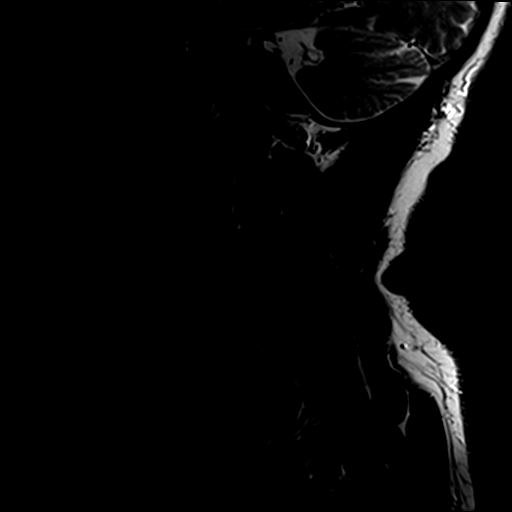
[im 3/15]
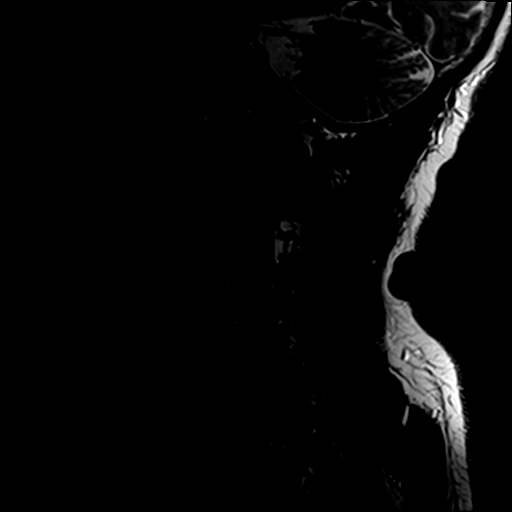
[im 6/15]
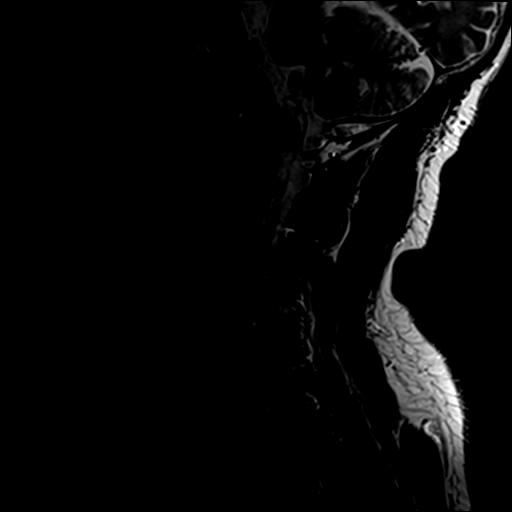
[im 9/15]
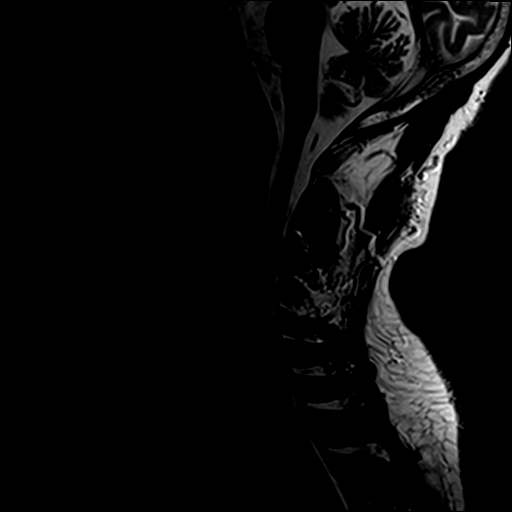
[im 12/15]
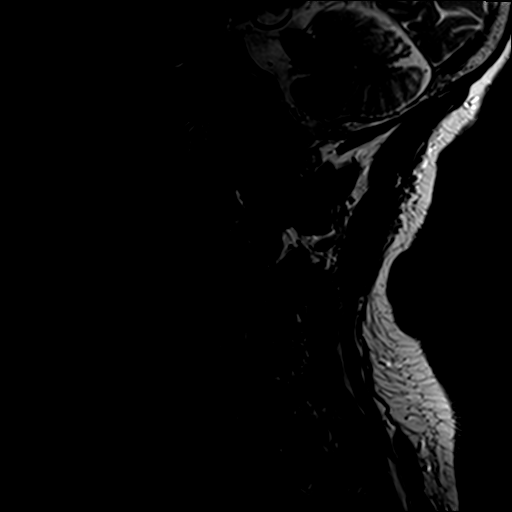
[im 15/15]
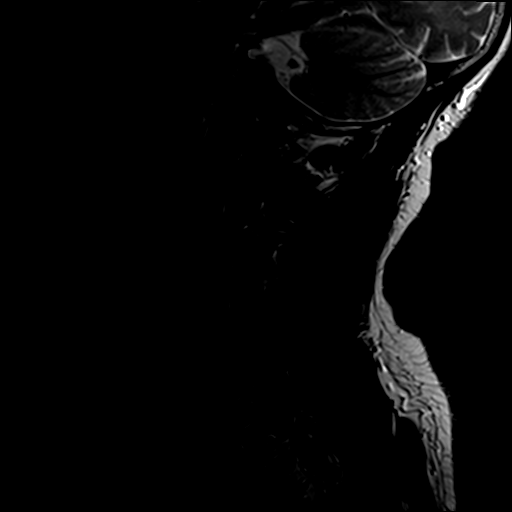

[Series 7: T1 · sagittal · 3.0mm · 0.41mm/px · 7 of 15 slices shown]
[im 1/15]
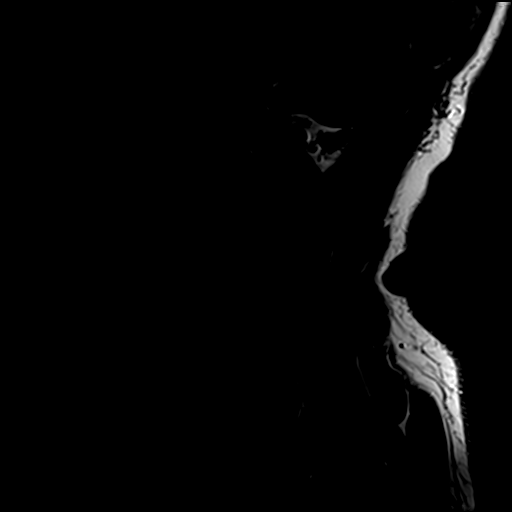
[im 3/15]
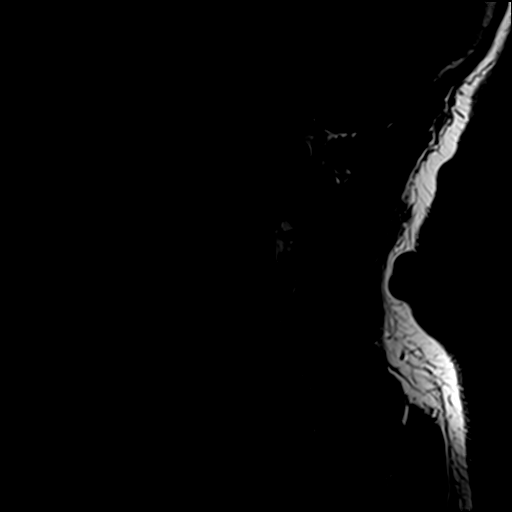
[im 5/15]
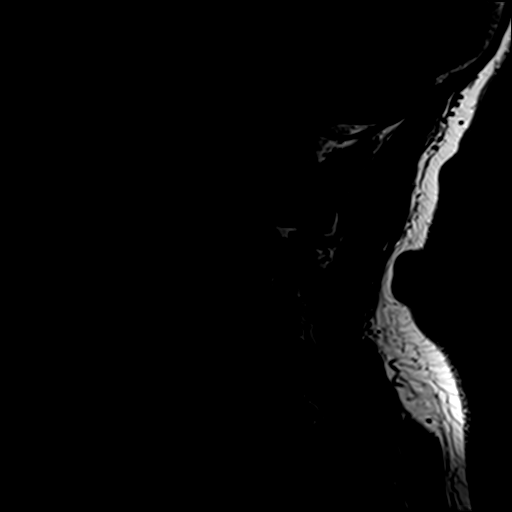
[im 8/15]
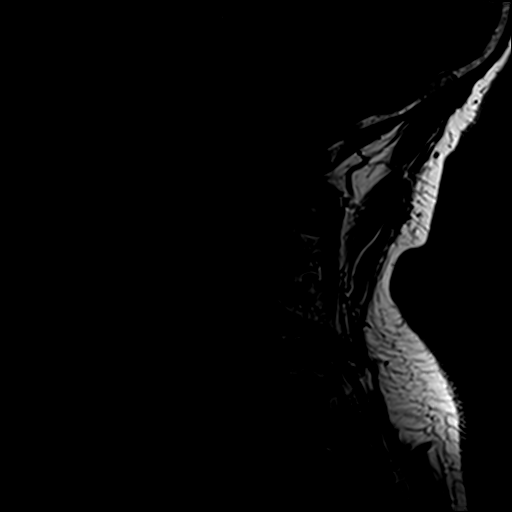
[im 10/15]
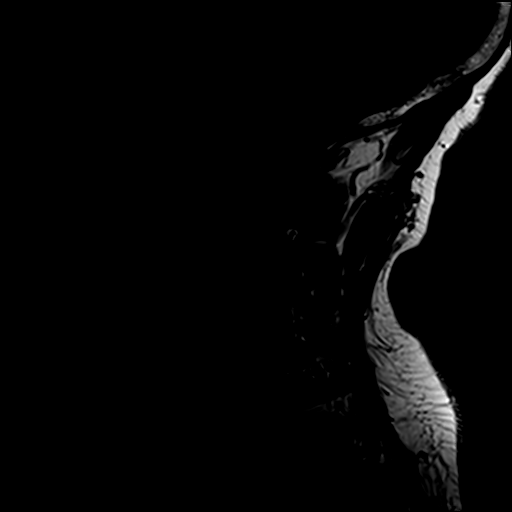
[im 12/15]
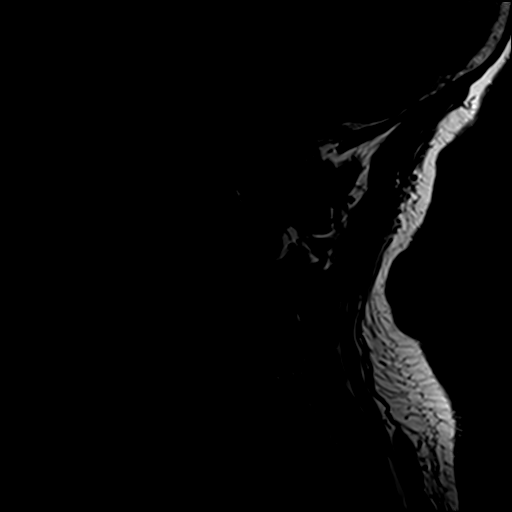
[im 15/15]
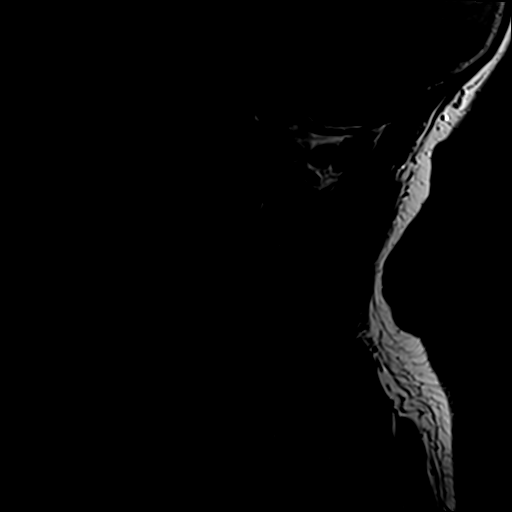

[Series 8: STIR · sagittal · 3.0mm · 0.82mm/px · 7 of 15 slices shown]
[im 1/15]
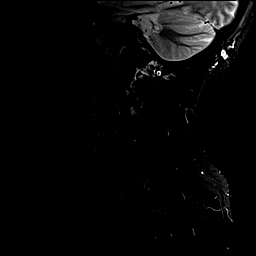
[im 3/15]
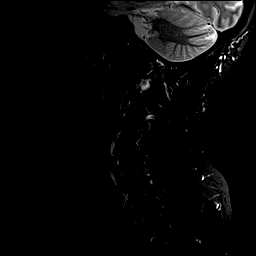
[im 5/15]
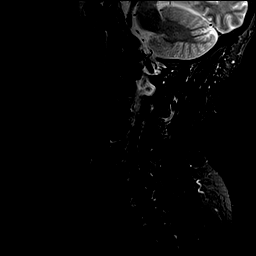
[im 8/15]
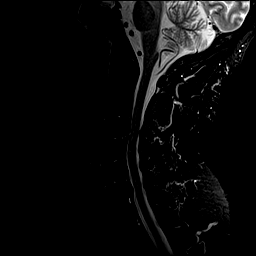
[im 10/15]
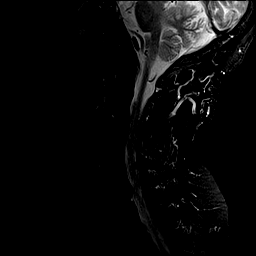
[im 12/15]
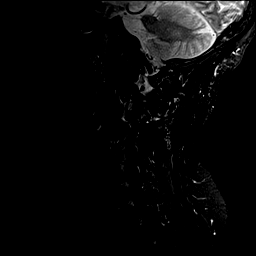
[im 15/15]
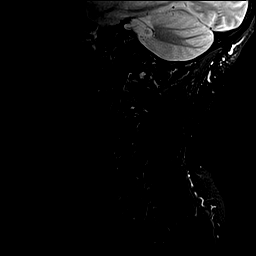

[Series 9: GRE · axial · 3.0mm · 0.35mm/px · 1 of 29 slices shown]
[im 1/29]
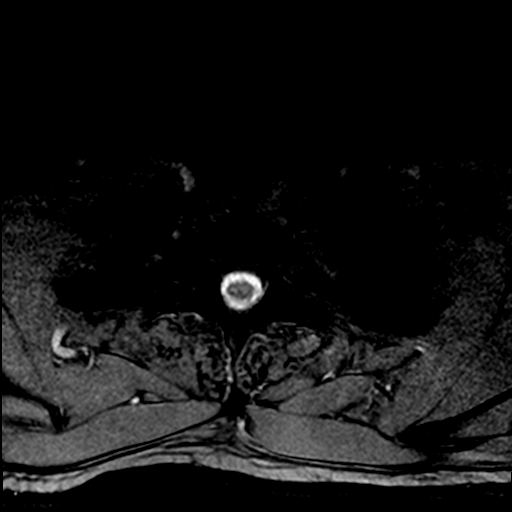

[Series 10: T2 · axial · 3.0mm · 0.70mm/px · z∈[-116,-11]mm · 8 of 29 slices shown (2 of 2)]
[im 1/29]
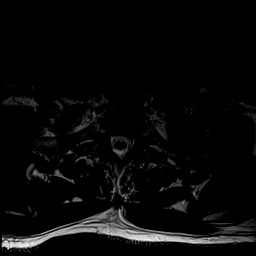
[im 5/29]
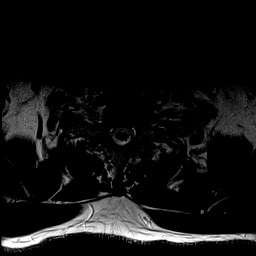
[im 9/29]
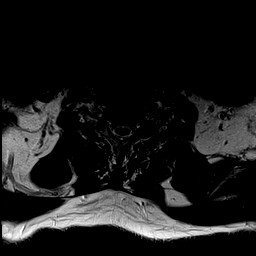
[im 13/29]
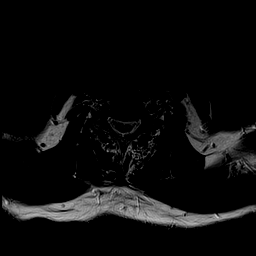
[im 16/29]
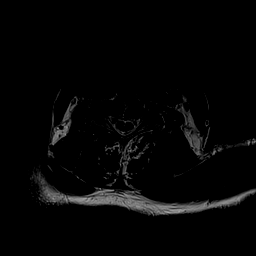
[im 20/29]
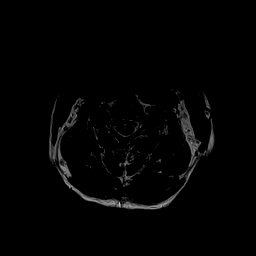
[im 24/29]
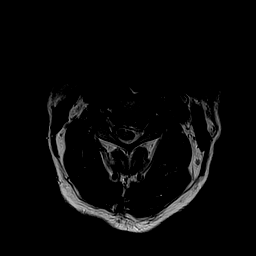
[im 29/29]
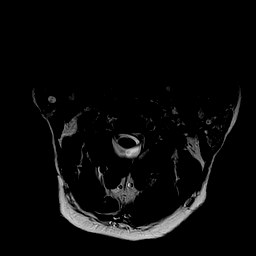

[29 of 48 positions shown; findings below may reference images not displayed]

FINDINGS: Alignment: Normal

Vertebrae: Negative for fracture or mass

Cord: Normal signal and morphology

Posterior Fossa, vertebral arteries, paraspinal tissues: Negative

Disc levels:

C2-3: Negative

C3-4: Left foraminal encroachment due to moderately large disc
osteophyte complex on the left. This is causing cord flattening and
mild spinal stenosis

C4-5: Mild disc degeneration. Small central osteophyte without
significant stenosis.

C5-6: Mild disc degeneration and mild facet degeneration without
significant stenosis

C6-7: Left foraminal encroachment. Asymmetric disc and osteophyte
complex on the left contributing to left foraminal encroachment.
Mild right foraminal narrowing and mild spinal stenosis due to
spurring

C7-T1: Negative
IMPRESSION: Left foraminal encroachment at C3-4 due to disc and osteophyte
complex. Mild spinal stenosis

Left foraminal encroachment C6-7 due to disc and osteophyte complex.
Mild spinal stenosis.

## 2019-05-09 ENCOUNTER — Other Ambulatory Visit: Payer: Self-pay | Admitting: Family Medicine

## 2019-05-15 ENCOUNTER — Other Ambulatory Visit: Payer: Self-pay | Admitting: Family Medicine

## 2019-05-20 ENCOUNTER — Encounter: Payer: Self-pay | Admitting: Family Medicine

## 2019-05-21 ENCOUNTER — Ambulatory Visit (INDEPENDENT_AMBULATORY_CARE_PROVIDER_SITE_OTHER): Payer: Medicare Other | Admitting: Physician Assistant

## 2019-05-21 ENCOUNTER — Encounter: Payer: Self-pay | Admitting: Physician Assistant

## 2019-05-21 DIAGNOSIS — M205X1 Other deformities of toe(s) (acquired), right foot: Secondary | ICD-10-CM | POA: Diagnosis not present

## 2019-05-21 DIAGNOSIS — M7751 Other enthesopathy of right foot: Secondary | ICD-10-CM | POA: Diagnosis not present

## 2019-05-21 DIAGNOSIS — M2580 Other specified joint disorders, unspecified joint: Secondary | ICD-10-CM

## 2019-05-21 NOTE — Progress Notes (Signed)
Virtual Visit via Video   I connected with Preston Pennington on 05/21/19 at  7:40 AM EDT by a video enabled telemedicine application and verified that I am speaking with the correct person using two identifiers. Location patient: Home Location provider: [facility], Office Persons participating in the virtual visit: Preston Pennington, [place] PA-C  I discussed the limitations of evaluation and management by telemedicine and the availability of in person appointments. The patient expressed understanding and agreed to proceed.   Subjective:   HPI:   Was previously referred to [facility] for sesamoiditis, hallux extensus and capsulitis of MTP joint of his R foot.  He was seeing Dr. Hardie Pulley. Patient has been told that he will need likely surgery. He wants to pursue a non-surgical approach and is interested in seeing another provider. He has no other concerns today.  ROS: See pertinent positives and negatives per HPI.  Patient Active Problem List   Diagnosis Date Noted  . 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 Prostate enlargement 11/22/2018  . Sesamoiditis 11/08/2018  . Hyperlipidemia associated with type 2 diabetes mellitus ([place]) 07/09/2018  . Osteoarthritis of spine with radiculopathy, cervical region 11/14/2017  . Left cervical radiculopathy 09/24/2017  . Right knee pain 11/21/2016  . History of adenomatous polyp of colon 11/06/2016  . Chronic ankle pain 06/14/2016  . Thrombocytopenia ([place]) 11/29/2015  . Gout 04/30/2015  . Erectile dysfunction 02/04/2015  . Essential hypertension 04/09/2013  . Diabetes mellitus type II, controlled ([place]) 04/09/2013  . BPH (benign prostatic hyperplasia) 04/09/2013    Social History   Tobacco Use  . Smoking status: Never Smoker  . Smokeless tobacco: Never Used  Substance Use Topics  . Alcohol use: No    Alcohol/week: 0.0 standard drinks    Current Outpatient Medications:  .  acetaminophen  (TYLENOL) 500 MG tablet, Take 1,000 mg by mouth every 6 (six) hours as needed for moderate pain. , Disp: , Rfl:  .  allopurinol (ZYLOPRIM) 100 MG tablet, TAKE 1 TABLET BY MOUTH ONCE DAILY, Disp: 90 tablet, Rfl: 3 .  amLODipine (NORVASC) 10 MG tablet, Take 1 tablet (10 mg total) by mouth daily. (Patient taking differently: Take 10 mg by mouth once a week. ), Disp: 90 tablet, Rfl: 3 .  atorvastatin (LIPITOR) 10 MG tablet, Take 1 tablet (10 mg total) by mouth once a week., Disp: 13 tablet, Rfl: 3 .  blood glucose meter kit and supplies KIT, Dispense based on patient and insurance preference. Use 2 times daily as directed. E11.9 Accu-Check [employer], Disp: 1 each, Rfl: 0 .  Camphor-Eucalyptus-Menthol (VICKS VAPORUB EX), Apply 1 application topically at bedtime as needed (pain/ congestions)., Disp: , Rfl:  .  dextromethorphan-guaiFENesin (TUSSIN DM) 10-100 MG/5ML liquid, Take 10 mLs by mouth every 6 (six) hours as needed for cough., Disp: , Rfl:  .  diclofenac sodium (VOLTAREN) 1 % GEL, Apply 2 g topically 4 (four) times daily., Disp: 100 g, Rfl: 3 .  feeding supplement, ENSURE ENLIVE, (ENSURE ENLIVE) LIQD, Take 1 Bottle by mouth daily., Disp: , Rfl:  .  gabapentin (NEURONTIN) 300 MG capsule, Take 1 capsule, orally, QID or as directed (Patient taking differently: 300 mg. Take 1 capsule, orally, QID or as directed), Disp: 360 capsule, Rfl: 0 .  glipiZIDE (GLUCOTROL) 5 MG tablet, TAKE ONE TABLET BY MOUTH ONCE DAILY BEFORE BREAKFAST, Disp: 90 tablet, Rfl: 3 .  glucose blood (ACCU-CHEK SMARTVIEW) test strip, Use to test blood sugar  daily and prn, Disp: 100 each, Rfl: 4 .  metFORMIN (GLUCOPHAGE) 1000 MG tablet, TAKE 1 TABLET BY MOUTH TWICE DAILY WITH MEALS, Disp: 180 tablet, Rfl: 0 .  MITIGARE 0.6 MG CAPS, TAKE 2 TABLETS BY MOUTH AT THE FIRST SIGN OF GOUT THEN 1 PILL 2 HOURS LATER THEN 1 PILL DAILY UNTIL FLARE IS RESOLVED, Disp: 30 capsule, Rfl: 0 .  ONETOUCH DELICA LANCETS FINE MISC, Use to check blood  sugar once a day. E11.9, Disp: 90 each, Rfl: 3 .  quinapril (ACCUPRIL) 20 MG tablet, Take 1 tablet (20 mg total) by mouth at bedtime., Disp: 90 tablet, Rfl: 1 .  Tetrahyd-Glyc-Hypro-PEG-ZnSulf (VISINE TOTALITY MULTI-SYMPTOM OP), Apply 1 drop to eye daily as needed (dry eyes)., Disp: , Rfl:   No Known Allergies  Objective:   VITALS: Per patient if applicable, see vitals. GENERAL: Alert, appears well and in no acute distress. HEENT: Atraumatic, conjunctiva clear, no obvious abnormalities on inspection of external nose and ears. NECK: Normal movements of the head and neck. CARDIOPULMONARY: No increased WOB. Speaking in clear sentences. I:E ratio WNL.  MS: Moves all visible extremities without noticeable abnormality. PSYCH: Pleasant and cooperative, well-groomed. Speech normal rate and rhythm. Affect is appropriate. Insight and judgement are appropriate. Attention is focused, linear, and appropriate.  NEURO: CN grossly intact. Oriented as arrived to appointment on time with no prompting. Moves both UE equally.  SKIN: No obvious lesions, wounds, erythema, or cyanosis noted on face or hands.  Assessment and Plan:   Diagnoses and all orders for this visit:  Sesamoiditis -     Ambulatory referral to Orthopedics  Hallux extensus, acquired, right -     Ambulatory referral to Orthopedics  Capsulitis of metatarsophalangeal (MTP) joint of right foot -     Ambulatory referral to Orthopedics   New referral placed to Dr. Almedia Balls at [facility] per patient request.  . Reviewed expectations re: course of current medical issues. . Discussed self-management of symptoms. . Outlined signs and symptoms indicating need for more acute intervention. . Patient verbalized understanding and all questions were answered. Marland Kitchen Health Maintenance issues including appropriate healthy diet, exercise, and smoking avoidance were discussed with patient. . See orders for this visit as documented in the  electronic medical record.  I discussed the assessment and treatment plan with the patient. The patient was provided an opportunity to ask questions and all were answered. The patient agreed with the plan and demonstrated an understanding of the instructions.   The patient was advised to call back or seek an in-person evaluation if the symptoms worsen or if the condition fails to improve as anticipated.   [place], [place] 05/21/2019

## 2019-05-24 ENCOUNTER — Other Ambulatory Visit: Payer: Self-pay | Admitting: Family Medicine

## 2019-05-28 ENCOUNTER — Other Ambulatory Visit: Payer: Self-pay | Admitting: Family Medicine

## 2019-05-30 ENCOUNTER — Ambulatory Visit (INDEPENDENT_AMBULATORY_CARE_PROVIDER_SITE_OTHER): Payer: Medicare Other | Admitting: Family Medicine

## 2019-05-30 ENCOUNTER — Other Ambulatory Visit: Payer: Self-pay

## 2019-05-30 ENCOUNTER — Encounter: Payer: Self-pay | Admitting: Family Medicine

## 2019-05-30 DIAGNOSIS — M19071 Primary osteoarthritis, right ankle and foot: Secondary | ICD-10-CM | POA: Diagnosis present

## 2019-05-30 MED ORDER — METHYLPREDNISOLONE ACETATE 40 MG/ML IJ SUSP
40.0000 mg | Freq: Once | INTRAMUSCULAR | Status: AC
  Administered 2019-05-30: 40 mg via INTRA_ARTICULAR

## 2019-05-30 NOTE — Patient Instructions (Signed)
I will send your regular doctor a note about you needing a new shoe . Please check with his office in about a week to see if he has sent the order to [facility] for a new raised shoe. Today I gave you a corticosteroid injection. Let me see you back in 2-3 weeks. Try wearing the green insol;e in the mean time.  Please call if you have any worsening symptoms or any questions. Great to meet you!
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
## 2019-06-02 NOTE — Addendum Note (Signed)
Addended by: Marin Olp on: 06/02/2019 11:03 AM   Modules accepted: Orders

## 2019-06-04 ENCOUNTER — Ambulatory Visit: Payer: Medicare Other | Admitting: Family Medicine

## 2019-06-20 ENCOUNTER — Other Ambulatory Visit: Payer: Self-pay

## 2019-06-20 ENCOUNTER — Ambulatory Visit (INDEPENDENT_AMBULATORY_CARE_PROVIDER_SITE_OTHER): Payer: Medicare HMO | Admitting: Family Medicine

## 2019-06-20 DIAGNOSIS — M2170 Unequal limb length (acquired), unspecified site: Secondary | ICD-10-CM | POA: Diagnosis not present

## 2019-06-20 DIAGNOSIS — M659 Synovitis and tenosynovitis, unspecified: Secondary | ICD-10-CM | POA: Diagnosis not present

## 2019-06-20 DIAGNOSIS — M19071 Primary osteoarthritis, right ankle and foot: Secondary | ICD-10-CM | POA: Diagnosis not present

## 2019-06-20 MED ORDER — METHYLPREDNISOLONE ACETATE 40 MG/ML IJ SUSP
40.0000 mg | Freq: Once | INTRAMUSCULAR | Status: AC
  Administered 2019-06-20: 40 mg via INTRA_ARTICULAR

## 2019-06-20 NOTE — Patient Instructions (Addendum)
Call [facility] to set up an appointment. Bring the prescription we gave you today to your appointment. Their information is: [facility] [address] [place], [place], [place] [address] [phone]

## 2019-06-21 ENCOUNTER — Encounter: Payer: Self-pay | Admitting: Family Medicine
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
## 2019-06-21 NOTE — Assessment & Plan Note (Signed)
He is actually had significantly more improvement in the short amount of time that I thought he would.  I think the orthotic is really helped.  Sesamoiditis seems to have resolved but he still has some tenosynovitis of the first ray.  We discussed options.  Decided to do a separate injection today into that tendon sheath.  Continue orthotic.  Follow-up 1 to 2 weeks.  We will offer him custom molded permanent orthotic for that foot.  Also gave him referral for the orthotist because he will need built-up shoes for the rest of his life as his leg length discrepancy is very significant.

## 2019-06-23 ENCOUNTER — Other Ambulatory Visit: Payer: Self-pay | Admitting: Family Medicine

## 2019-06-23 ENCOUNTER — Other Ambulatory Visit: Payer: Self-pay

## 2019-06-23 MED ORDER — ACCU-CHEK SMARTVIEW VI STRP: ORAL_STRIP | 4 refills | Status: DC

## 2019-06-23 NOTE — Addendum Note (Signed)
Addended by: Jasper Loser on: 06/23/2019 01:22 PM   Modules accepted: Orders

## 2019-06-23 NOTE — Telephone Encounter (Signed)
Refill was sent to last until appt next month on 07/10/19. Patient was notified.

## 2019-06-23 NOTE — Telephone Encounter (Signed)
Medication: glucose blood (ACCU-CHEK SMARTVIEW) test strip [id number]   Has the patient contacted their pharmacy? Yes  (Agent: If no, request that the patient contact the pharmacy for the refill.) (Agent: If yes, when and what did the pharmacy advise?)  Preferred Pharmacy (with phone number or street name): [facility], [place]. [phone] (Phone) [phone] (Fax)    Agent: Please be advised that RX refills may take up to 3 business days. We ask that you follow-up with your pharmacy.

## 2019-06-23 NOTE — Telephone Encounter (Signed)
See request °

## 2019-07-05 ENCOUNTER — Other Ambulatory Visit: Payer: Self-pay | Admitting: Family Medicine

## 2019-07-09 NOTE — Patient Instructions (Addendum)
Health Maintenance Due  Topic Date Due  . PNA vac Low Risk Adult (2 of 2 - PPSV23)-today 02/13/2019  . INFLUENZA VACCINE-High dose flu shot today 05/31/2019   4 month physical if a1c is over 7, if under 7 we can do 6 month physical   Please stop by lab before you go If you do not have mychart- we will call you about results within 5 business days of [place] receiving them.  If you have mychart- we will send your results within 3 business days of [place] receiving them.  If abnormal or we want to clarify a result, we will call or mychart you to make sure you receive the message.  If you have questions or concerns or don't hear within 5-7 days, please send [place] a message or call us.

## 2019-07-09 NOTE — Progress Notes (Signed)
Phone [phone]   Subjective:  Preston Pennington is a 68 y.o. year old very pleasant male patient who presents for/with See problem oriented charting Chief Complaint  Patient presents with  . Follow-up    Not fasting today.   . Diabetes  . Hypertension  . Hyperlipidemia  . Benign Prostatic Hypertrophy  . Gout   ROS- No chest pain or shortness of breath. No headache or blurry vision. Still with some right ankle and foot pain but improving with help of Dr. Nori Riis.    Past Medical History-  Patient Active Problem List   Diagnosis Date Noted  . Diabetes mellitus type II, controlled ([place]) 04/09/2013    Priority: High  . 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 Hyperlipidemia associated with type 2 diabetes mellitus ([place]) 07/09/2018    Priority: Medium  . Left cervical radiculopathy 09/24/2017    Priority: Medium  . History of adenomatous polyp of colon 11/06/2016    Priority: Medium  . Chronic ankle pain 06/14/2016    Priority: Medium  . Thrombocytopenia ([place]) 11/29/2015    Priority: Medium  . Gout 04/30/2015    Priority: Medium  . Essential hypertension 04/09/2013    Priority: Medium  . Leg length difference, acquired 06/20/2019    Priority: Low  . Arthritis of first metatarsophalangeal (MTP) joint of right foot 05/30/2019    Priority: Low  . Sesamoiditis 11/08/2018    Priority: Low  . Osteoarthritis of spine with radiculopathy, cervical region 11/14/2017    Priority: Low  . Right knee pain 11/21/2016    Priority: Low  . Erectile dysfunction 02/04/2015    Priority: Low  . BPH (benign prostatic hyperplasia) 04/09/2013    Priority: Low    Medications- reviewed and updated Current Outpatient Medications  Medication Sig Dispense Refill  . acetaminophen (TYLENOL) 500 MG tablet Take 1,000 mg by mouth every 6 (six) hours as needed for moderate pain.     Marland Kitchen allopurinol (ZYLOPRIM) 100 MG tablet Take 1 tablet by mouth once daily 30  tablet 0  . amLODipine (NORVASC) 10 MG tablet Take 1 tablet (10 mg total) by mouth daily. 90 tablet 3  . atorvastatin (LIPITOR) 10 MG tablet Take 1 tablet (10 mg total) by mouth once a week. 13 tablet 3  . blood glucose meter kit and supplies KIT Dispense based on patient and insurance preference. Use 2 times daily as directed. E11.9 Accu-Check Smart View Meter 1 each 0  . Camphor-Eucalyptus-Menthol (VICKS VAPORUB EX) Apply 1 application topically at bedtime as needed (pain/ congestions).    Marland Kitchen dextromethorphan-guaiFENesin (TUSSIN DM) 10-100 MG/5ML liquid Take 10 mLs by mouth every 6 (six) hours as needed for cough.    . diclofenac sodium (VOLTAREN) 1 % GEL APPLY 2 GRAMS TOPICALLY 4 TIMES DAILY 100 g 0  . feeding supplement, ENSURE ENLIVE, (ENSURE ENLIVE) LIQD Take 1 Bottle by mouth daily.    Marland Kitchen gabapentin (NEURONTIN) 300 MG capsule Take 1 capsule, orally, QID or as directed (Patient taking differently: 300 mg. Take 1 capsule, orally, QID or as directed) 360 capsule 0  . glipiZIDE (GLUCOTROL) 5 MG tablet TAKE 1 TABLET BY MOUTH ONCE DAILY BEFORE BREAKFAST 90 tablet 0  . glucose blood (ACCU-CHEK SMARTVIEW) test strip Use to test blood sugar daily and prn 100 each 4  . metFORMIN (GLUCOPHAGE) 1000 MG tablet TAKE 1 TABLET BY MOUTH TWICE DAILY WITH MEALS 180 tablet 0  . ONETOUCH DELICA LANCETS FINE MISC Use to  check blood sugar once a day. E11.9 90 each 3  . quinapril (ACCUPRIL) 20 MG tablet Take 1 tablet (20 mg total) by mouth at bedtime. 90 tablet 1  . Tetrahyd-Glyc-Hypro-PEG-ZnSulf (VISINE TOTALITY MULTI-SYMPTOM OP) Apply 1 drop to eye daily as needed (dry eyes).    . COLCRYS 0.6 MG tablet TAKE 2 CAPSULES BY MOUTH AT THE FIRST SIGN OF GOUT THEN TAKE 1 CAPSULE 2 HOURS LATER THEN 1 CAPSULE DAILY UNTIL FLARE IS RESOLVED 30 tablet 5   No current facility-administered medications for this visit.      Objective:  BP 118/76 (BP Location: Left Arm, Patient Position: Sitting, Cuff Size: Normal)   Pulse 80    Temp 98.2 F (36.8 C) (Temporal)   Ht '5\' 7"'  (1.702 m)   Wt 154 lb 6.4 oz (70 kg)   SpO2 95%   BMI 24.18 kg/m  Gen: NAD, resting comfortably CV: RRR no murmurs rubs or gallops Lungs: CTAB no crackles, wheeze, rhonchi Abdomen: soft/nontender/nondistended/normal bowel sounds.  Ext: stable edema on right (prior surgeries), none on left Skin: warm, dry Msk: lifted shoe on right     Assessment and Plan   # Diabetes S:  controlled on Metformin 1000 mg BID and Glipizide 5 mg daily.  Highest he has seen in AM has been 150- if tightens up diet improves to 110s. No low blood sugar Lab Results  Component Value Date   HGBA1C 6.5 03/11/2019   HGBA1C 5.9 11/08/2018   HGBA1C 6.0 (A) 07/09/2018   A/P: hopefully controlled - update a1c today  #hypertension S: controlled on Amlodipine 10 mg once weekly and Quinapril 20 mg faily.  BP Readings from Last 3 Encounters:  07/10/19 118/76  06/20/19 134/76  05/30/19 120/60  A/P: Stable. Continue current medications.   #hyperlipidemia S: hopefully controlled on Atorvastatin 10 mg once weekly.  Lab Results  Component Value Date   CHOL 128 07/09/2018   HDL 38.40 (L) 07/09/2018   LDLCALC 74 07/09/2018   LDLDIRECT 92.0 03/11/2019   TRIG 77.0 07/09/2018   CHOLHDL 3 07/09/2018   A/P: hopefully controlled- update lipids today- ideally LDL under 70.   # Gout S:Taking Allopurinol 100 mg daily and Colchicine 0.6 mg prn.  No recent flares A/P: Stable. Continue current medications.    # Thrombocytopenia/leukopenia S:platelets generally around 100.  Intermittent leukopenia- typically get cbc with differential  07/04/18 path smear review "Myeloid population consists predominantly of mature  segmented neutrophils. No immature cells are identified. RBCs appear to be microcytic and hypochromic on smear review. Suggest evaluation for iron deficiency, if clinically indicated. Thrombocytopenia with some large platelets seen. No platelet clumps identified.  Reviewed by Francis Gaines Mammarappallil, MD " A/P: update cbc with differential today- also check iron levels based off prior path smear review    % BPH status post robotic prostatectomy December 2018- Follows with Dr. Alyson Ingles at [facility]rology [facility].   For elevated PSA-biopsy in 2017 reassuring. -PSA levels are drawn at urology  Recommended follow up: 4 month physical if a1c is over 7, if under 7 we can do 6 month physical Future Appointments  Date Time Provider [place]  07/18/2019 10:00 AM Dickie La, MD [facility]   Lab/Order associations:   ICD-10-CM   1. Controlled type 2 diabetes mellitus without complication, without long-term current use of insulin (HCC)  E11.9 CBC with Differential/Platelet    Comprehensive metabolic panel    Lipid panel    Hemoglobin A1c  2. Hyperlipidemia associated with type  2 diabetes mellitus ([place])  E11.69    E78.5   3. Essential hypertension  I10   4. Thrombocytopenia (HCC)  D69.6 CBC with Differential/Platelet    IBC + Ferritin  5. Screening for prostate cancer  Z12.5     Meds ordered this encounter  Medications  . COLCRYS 0.6 MG tablet    Sig: TAKE 2 CAPSULES BY MOUTH AT THE FIRST SIGN OF GOUT THEN TAKE 1 CAPSULE 2 HOURS LATER THEN 1 CAPSULE DAILY UNTIL FLARE IS RESOLVED    Dispense:  30 tablet    Refill:  5    Return precautions advised.  Garret Reddish, MD

## 2019-07-10 ENCOUNTER — Ambulatory Visit (INDEPENDENT_AMBULATORY_CARE_PROVIDER_SITE_OTHER): Payer: Medicare HMO | Admitting: Family Medicine

## 2019-07-10 ENCOUNTER — Other Ambulatory Visit: Payer: Self-pay

## 2019-07-10 ENCOUNTER — Encounter: Payer: Self-pay | Admitting: Family Medicine

## 2019-07-10 VITALS — BP 118/76 | HR 80 | Temp 98.2°F | Ht 67.0 in | Wt 154.4 lb

## 2019-07-10 DIAGNOSIS — I10 Essential (primary) hypertension: Secondary | ICD-10-CM

## 2019-07-10 DIAGNOSIS — E785 Hyperlipidemia, unspecified: Secondary | ICD-10-CM | POA: Diagnosis not present

## 2019-07-10 DIAGNOSIS — Z23 Encounter for immunization: Secondary | ICD-10-CM

## 2019-07-10 DIAGNOSIS — Z125 Encounter for screening for malignant neoplasm of prostate: Secondary | ICD-10-CM | POA: Diagnosis not present

## 2019-07-10 DIAGNOSIS — E119 Type 2 diabetes mellitus without complications: Secondary | ICD-10-CM | POA: Diagnosis not present

## 2019-07-10 DIAGNOSIS — L98499 Non-pressure chronic ulcer of skin of other sites with unspecified severity: Secondary | ICD-10-CM | POA: Diagnosis not present

## 2019-07-10 DIAGNOSIS — E1169 Type 2 diabetes mellitus with other specified complication: Secondary | ICD-10-CM | POA: Diagnosis not present

## 2019-07-10 DIAGNOSIS — D696 Thrombocytopenia, unspecified: Secondary | ICD-10-CM | POA: Diagnosis not present

## 2019-07-10 LAB — CBC WITH DIFFERENTIAL/PLATELET
Basophils Absolute: 0 K/uL (ref 0.0–0.1)
Basophils Relative: 0.5 % (ref 0.0–3.0)
Eosinophils Absolute: 0.2 K/uL (ref 0.0–0.7)
Eosinophils Relative: 5.5 % — ABNORMAL HIGH (ref 0.0–5.0)
HCT: 42.7 % (ref 39.0–52.0)
Hemoglobin: 13.7 g/dL (ref 13.0–17.0)
Lymphocytes Relative: 37.9 % (ref 12.0–46.0)
Lymphs Abs: 1.6 K/uL (ref 0.7–4.0)
MCHC: 32.1 g/dL (ref 30.0–36.0)
MCV: 81.8 fl (ref 78.0–100.0)
Monocytes Absolute: 0.4 K/uL (ref 0.1–1.0)
Monocytes Relative: 8.5 % (ref 3.0–12.0)
Neutro Abs: 2 K/uL (ref 1.4–7.7)
Neutrophils Relative %: 47.6 % (ref 43.0–77.0)
Platelets: 95 K/uL — ABNORMAL LOW (ref 150.0–400.0)
RBC: 5.22 Mil/uL (ref 4.22–5.81)
RDW: 13.8 % (ref 11.5–15.5)
WBC: 4.2 K/uL (ref 4.0–10.5)

## 2019-07-10 LAB — LIPID PANEL
Cholesterol: 140 mg/dL (ref 0–200)
HDL: 42.2 mg/dL (ref >39.00)
LDL Cholesterol: 83 mg/dL (ref 0–99)
NonHDL: 97.97
Total CHOL/HDL Ratio: 3
Triglycerides: 76 mg/dL (ref 0.0–149.0)
VLDL: 15.2 mg/dL (ref 0.0–40.0)

## 2019-07-10 LAB — IBC + FERRITIN
Ferritin: 36.1 ng/mL (ref 22.0–322.0)
Iron: 80 ug/dL (ref 42–165)
Saturation Ratios: 21.7 % (ref 20.0–50.0)
Transferrin: 263 mg/dL (ref 212.0–360.0)

## 2019-07-10 LAB — COMPREHENSIVE METABOLIC PANEL WITH GFR
ALT: 15 U/L (ref 0–53)
AST: 15 U/L (ref 0–37)
Albumin: 4 g/dL (ref 3.5–5.2)
Alkaline Phosphatase: 67 U/L (ref 39–117)
BUN: 13 mg/dL (ref 6–23)
CO2: 28 meq/L (ref 19–32)
Calcium: 9.3 mg/dL (ref 8.4–10.5)
Chloride: 103 meq/L (ref 96–112)
Creatinine, Ser: 0.89 mg/dL (ref 0.40–1.50)
GFR: 102.57 mL/min
Glucose, Bld: 75 mg/dL (ref 70–99)
Potassium: 4.1 meq/L (ref 3.5–5.1)
Sodium: 140 meq/L (ref 135–145)
Total Bilirubin: 0.5 mg/dL (ref 0.2–1.2)
Total Protein: 6.5 g/dL (ref 6.0–8.3)

## 2019-07-10 LAB — HEMOGLOBIN A1C: Hgb A1c MFr Bld: 6.6 % — ABNORMAL HIGH (ref 4.6–6.5)

## 2019-07-10 MED ORDER — COLCRYS 0.6 MG PO TABS: ORAL_TABLET | ORAL | 5 refills | Status: DC

## 2019-07-10 MED ORDER — GABAPENTIN 300 MG PO CAPS: ORAL_CAPSULE | ORAL | 0 refills | Status: DC

## 2019-07-10 NOTE — Telephone Encounter (Signed)
Previously prescribed by Dr. Paulla Fore.   Forwarding to Dr. Yong Channel to approve/deny

## 2019-07-10 NOTE — Addendum Note (Signed)
Addended by: Jasper Loser on: 07/10/2019 07:17 PM   Modules accepted: Orders

## 2019-07-14 ENCOUNTER — Telehealth: Payer: Self-pay | Admitting: Family Medicine

## 2019-07-14 DIAGNOSIS — E119 Type 2 diabetes mellitus without complications: Secondary | ICD-10-CM

## 2019-07-14 NOTE — Telephone Encounter (Signed)
Caller name: Dawn  Relation to pt: from [place] [facility] [place]   Call back number: [phone]  Pharmacy:336 [phone]  Reason for call:  Referral for office visit diabetic foot care patient scheduled to see Dr. Gardiner Barefoot 07/23/2019.(specialist in [place])

## 2019-07-14 NOTE — Telephone Encounter (Signed)
Referral has been placed. 

## 2019-07-14 NOTE — Telephone Encounter (Signed)
See below

## 2019-07-15 ENCOUNTER — Other Ambulatory Visit: Payer: Self-pay | Admitting: Family Medicine

## 2019-07-18 ENCOUNTER — Other Ambulatory Visit: Payer: Self-pay

## 2019-07-18 ENCOUNTER — Ambulatory Visit (INDEPENDENT_AMBULATORY_CARE_PROVIDER_SITE_OTHER): Payer: Medicare HMO | Admitting: Family Medicine

## 2019-07-18 VITALS — BP 130/82 | Ht 67.0 in | Wt 155.0 lb

## 2019-07-18 DIAGNOSIS — M659 Synovitis and tenosynovitis, unspecified: Secondary | ICD-10-CM

## 2019-07-18 DIAGNOSIS — M19071 Primary osteoarthritis, right ankle and foot: Secondary | ICD-10-CM | POA: Diagnosis not present

## 2019-07-18 MED ORDER — AMMONIUM LACTATE 12 % EX CREA: TOPICAL_CREAM | CUTANEOUS | 2 refills | Status: DC

## 2019-07-18 NOTE — Patient Instructions (Signed)
I am sending in a prescription for some cream I want you to use on the area of callus on your toe.  Use a small amount 2 or 3 times a day.  Go to the drugstore to buy something called a pumice stone.  You can use it as a smoothing tool to help scrape off some of the excess callus.  Use it daily, usually after your bath or shower squeezing the area.  The combination of the cream and the smoothing with a pumice stone should help decrease the size of the callus over time.  He will probably always have some callus there but the goal is to have it reduced in size  You have an appointment with [facility] to see about getting a new built-up shoe.  I had given you a temporary orthotic for that shoe which seems to have been very helpful to you.  They can either make you in permanent orthotic that fits in that shoe or you can come back here after you get your new shoe and I can make you 1.  I was great to see you
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
## 2019-07-19 NOTE — Progress Notes (Signed)
[facility] Attending Note: I have seen and examined this patient. I have discussed this patient with the resident and reviewed the assessment and plan as documented above. I agree with the resident's findings and plan.  

## 2019-07-23 ENCOUNTER — Ambulatory Visit (INDEPENDENT_AMBULATORY_CARE_PROVIDER_SITE_OTHER): Payer: Medicare HMO | Admitting: Podiatry

## 2019-07-23 ENCOUNTER — Ambulatory Visit: Payer: Medicare HMO | Admitting: Orthotics

## 2019-07-23 ENCOUNTER — Other Ambulatory Visit: Payer: Self-pay

## 2019-07-23 ENCOUNTER — Encounter: Payer: Self-pay | Admitting: Podiatry

## 2019-07-23 DIAGNOSIS — Z8781 Personal history of (healed) traumatic fracture: Secondary | ICD-10-CM | POA: Diagnosis not present

## 2019-07-23 DIAGNOSIS — M7751 Other enthesopathy of right foot: Secondary | ICD-10-CM

## 2019-07-23 DIAGNOSIS — M2580 Other specified joint disorders, unspecified joint: Secondary | ICD-10-CM | POA: Diagnosis not present
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
## 2019-07-23 NOTE — Progress Notes (Signed)
Not diabetic, but gave him a diabetic insert w/ offload 1st met head.

## 2019-08-07 ENCOUNTER — Other Ambulatory Visit: Payer: Self-pay | Admitting: Family Medicine

## 2019-08-16 ENCOUNTER — Other Ambulatory Visit: Payer: Self-pay | Admitting: Family Medicine

## 2019-08-29 DIAGNOSIS — H401131 Primary open-angle glaucoma, bilateral, mild stage: Secondary | ICD-10-CM | POA: Diagnosis not present

## 2019-09-03 ENCOUNTER — Other Ambulatory Visit: Payer: Self-pay | Admitting: Family Medicine

## 2019-09-05 ENCOUNTER — Other Ambulatory Visit: Payer: Self-pay | Admitting: Family Medicine

## 2019-10-10 ENCOUNTER — Other Ambulatory Visit: Payer: Self-pay | Admitting: Family Medicine

## 2019-11-08 ENCOUNTER — Other Ambulatory Visit: Payer: Self-pay | Admitting: Family Medicine

## 2019-11-12 DIAGNOSIS — H5712 Ocular pain, left eye: Secondary | ICD-10-CM | POA: Diagnosis not present

## 2019-11-12 DIAGNOSIS — H401124 Primary open-angle glaucoma, left eye, indeterminate stage: Secondary | ICD-10-CM | POA: Diagnosis not present

## 2019-11-12 DIAGNOSIS — H04122 Dry eye syndrome of left lacrimal gland: Secondary | ICD-10-CM | POA: Diagnosis not present

## 2019-11-12 DIAGNOSIS — H1132 Conjunctival hemorrhage, left eye: Secondary | ICD-10-CM | POA: Diagnosis not present

## 2019-11-18 ENCOUNTER — Other Ambulatory Visit: Payer: Self-pay | Admitting: Family Medicine

## 2019-11-19 ENCOUNTER — Other Ambulatory Visit: Payer: Self-pay

## 2019-11-20 ENCOUNTER — Encounter: Payer: Self-pay | Admitting: Family Medicine

## 2019-11-20 ENCOUNTER — Ambulatory Visit (INDEPENDENT_AMBULATORY_CARE_PROVIDER_SITE_OTHER): Payer: Medicare HMO | Admitting: Family Medicine

## 2019-11-20 ENCOUNTER — Other Ambulatory Visit: Payer: Self-pay

## 2019-11-20 VITALS — BP 124/68 | HR 82 | Temp 97.9°F | Ht 67.0 in | Wt 158.8 lb

## 2019-11-20 DIAGNOSIS — Z1211 Encounter for screening for malignant neoplasm of colon: Secondary | ICD-10-CM

## 2019-11-20 DIAGNOSIS — M1A09X0 Idiopathic chronic gout, multiple sites, without tophus (tophi): Secondary | ICD-10-CM

## 2019-11-20 DIAGNOSIS — M5412 Radiculopathy, cervical region: Secondary | ICD-10-CM | POA: Diagnosis not present

## 2019-11-20 DIAGNOSIS — D696 Thrombocytopenia, unspecified: Secondary | ICD-10-CM

## 2019-11-20 DIAGNOSIS — E119 Type 2 diabetes mellitus without complications: Secondary | ICD-10-CM | POA: Diagnosis not present

## 2019-11-20 DIAGNOSIS — I10 Essential (primary) hypertension: Secondary | ICD-10-CM

## 2019-11-20 LAB — CBC WITH DIFFERENTIAL/PLATELET
Basophils Absolute: 0 10*3/uL (ref 0.0–0.1)
Basophils Relative: 0.6 % (ref 0.0–3.0)
Eosinophils Absolute: 0.3 10*3/uL (ref 0.0–0.7)
Eosinophils Relative: 5.9 % — ABNORMAL HIGH (ref 0.0–5.0)
HCT: 43.1 % (ref 39.0–52.0)
Hemoglobin: 13.7 g/dL (ref 13.0–17.0)
Lymphocytes Relative: 42.6 % (ref 12.0–46.0)
Lymphs Abs: 1.9 10*3/uL (ref 0.7–4.0)
MCHC: 31.8 g/dL (ref 30.0–36.0)
MCV: 81.4 fl (ref 78.0–100.0)
Monocytes Absolute: 0.5 10*3/uL (ref 0.1–1.0)
Monocytes Relative: 10.2 % (ref 3.0–12.0)
Neutro Abs: 1.8 10*3/uL (ref 1.4–7.7)
Neutrophils Relative %: 40.7 % — ABNORMAL LOW (ref 43.0–77.0)
Platelets: 95 10*3/uL — ABNORMAL LOW (ref 150.0–400.0)
RBC: 5.3 Mil/uL (ref 4.22–5.81)
RDW: 13.7 % (ref 11.5–15.5)
WBC: 4.5 10*3/uL (ref 4.0–10.5)

## 2019-11-20 LAB — COMPREHENSIVE METABOLIC PANEL
ALT: 19 U/L (ref 0–53)
AST: 14 U/L (ref 0–37)
Albumin: 4.2 g/dL (ref 3.5–5.2)
Alkaline Phosphatase: 79 U/L (ref 39–117)
BUN: 15 mg/dL (ref 6–23)
CO2: 25 mEq/L (ref 19–32)
Calcium: 9.5 mg/dL (ref 8.4–10.5)
Chloride: 105 mEq/L (ref 96–112)
Creatinine, Ser: 0.97 mg/dL (ref 0.40–1.50)
GFR: 92.77 mL/min (ref >60.00)
Glucose, Bld: 323 mg/dL — ABNORMAL HIGH (ref 70–99)
Potassium: 4.4 mEq/L (ref 3.5–5.1)
Sodium: 140 mEq/L (ref 135–145)
Total Bilirubin: 0.4 mg/dL (ref 0.2–1.2)
Total Protein: 6.5 g/dL (ref 6.0–8.3)

## 2019-11-20 LAB — HEMOGLOBIN A1C: Hgb A1c MFr Bld: 7.6 % — ABNORMAL HIGH (ref 4.6–6.5)

## 2019-11-20 LAB — URIC ACID: Uric Acid, Serum: 4.5 mg/dL (ref 4.0–7.8)

## 2019-11-20 MED ORDER — PREDNISONE 20 MG PO TABS: ORAL_TABLET | ORAL | 0 refills | Status: DC

## 2019-11-20 MED ORDER — COLCHICINE 0.6 MG PO TABS: ORAL_TABLET | ORAL | 5 refills | Status: DC

## 2019-11-20 MED ORDER — MITIGARE 0.6 MG PO CAPS: ORAL_CAPSULE | ORAL | 5 refills | Status: DC

## 2019-11-20 NOTE — Progress Notes (Signed)
Phone [phone] In person visit   Subjective:   Preston Pennington is a 69 y.o. year old very pleasant male patient who presents for/with See problem oriented charting Chief Complaint  Patient presents with  . Medication Management    This visit occurred during the SARS-CoV-2 public health emergency.  Safety protocols were in place, including screening questions prior to the visit, additional usage of staff PPE, and extensive cleaning of exam room while observing appropriate contact time as indicated for disinfecting solutions.   Past Medical History-  Patient Active Problem List   Diagnosis Date Noted  . Diabetes mellitus type II, controlled ([place]) 04/09/2013    Priority: High  . 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 Hyperlipidemia associated with type 2 diabetes mellitus ([place]) 07/09/2018    Priority: Medium  . Left cervical radiculopathy 09/24/2017    Priority: Medium  . History of adenomatous polyp of colon 11/06/2016    Priority: Medium  . Chronic ankle pain 06/14/2016    Priority: Medium  . Thrombocytopenia ([place]) 11/29/2015    Priority: Medium  . Gout 04/30/2015    Priority: Medium  . Essential hypertension 04/09/2013    Priority: Medium  . Leg length difference, acquired 06/20/2019    Priority: Low  . Arthritis of first metatarsophalangeal (MTP) joint of right foot 05/30/2019    Priority: Low  . Sesamoiditis 11/08/2018    Priority: Low  . Osteoarthritis of spine with radiculopathy, cervical region 11/14/2017    Priority: Low  . Right knee pain 11/21/2016    Priority: Low  . Erectile dysfunction 02/04/2015    Priority: Low  . BPH (benign prostatic hyperplasia) 04/09/2013    Priority: Low    Medications- reviewed and updated Current Outpatient Medications  Medication Sig Dispense Refill  . acetaminophen (TYLENOL) 500 MG tablet Take 1,000 mg by mouth every 6 (six) hours as needed for moderate pain.     Marland Kitchen allopurinol  (ZYLOPRIM) 100 MG tablet Take 1 tablet by mouth once daily 30 tablet 0  . amLODipine (NORVASC) 10 MG tablet Take 1 tablet by mouth once daily 90 tablet 1  . ammonium lactate (LAC-HYDRIN) 12 % cream Apply to the area of callus 2-3 times a day 385 g 2  . atorvastatin (LIPITOR) 10 MG tablet Take 1 tablet (10 mg total) by mouth once a week. 13 tablet 3  . blood glucose meter kit and supplies KIT Dispense based on patient and insurance preference. Use 2 times daily as directed. E11.9 Accu-Check Smart View Meter 1 each 0  . Camphor-Eucalyptus-Menthol (VICKS VAPORUB EX) Apply 1 application topically at bedtime as needed (pain/ congestions).    . colchicine (COLCRYS) 0.6 MG tablet TAKE 2 CAPSULES BY MOUTH AT THE FIRST SIGN OF GOUT THEN TAKE 1 CAPSULE 2 HOURS LATER THEN 1 CAPSULE DAILY UNTIL FLARE IS RESOLVED 30 tablet 5  . diclofenac sodium (VOLTAREN) 1 % GEL APPLY 2 GRAMS TOPICALLY  TO AFFECTED AREA 4 TIMES DAILY 100 g 3  . feeding supplement, ENSURE ENLIVE, (ENSURE ENLIVE) LIQD Take 1 Bottle by mouth daily.    Marland Kitchen gabapentin (NEURONTIN) 300 MG capsule TAKE 1 CAPSULE BY MOUTH 4 TIMES DAILY OR  AS  DIRECTED 360 capsule 0  . glipiZIDE (GLUCOTROL) 5 MG tablet TAKE 1 TABLET BY MOUTH ONCE DAILY BEFORE BREAKFAST 90 tablet 0  . glucose blood (ACCU-CHEK SMARTVIEW) test strip Use to test blood sugar daily and prn 100 each 4  . metFORMIN (GLUCOPHAGE)  1000 MG tablet TAKE 1 TABLET BY MOUTH TWICE DAILY WITH MEALS 180 tablet 0  . ONETOUCH DELICA LANCETS FINE MISC Use to check blood sugar once a day. E11.9 90 each 3  . quinapril (ACCUPRIL) 20 MG tablet TAKE 1 TABLET BY MOUTH AT BEDTIME 90 tablet 0  . Tetrahyd-Glyc-Hypro-PEG-ZnSulf (VISINE TOTALITY MULTI-SYMPTOM OP) Apply 1 drop to eye daily as needed (dry eyes).    Marland Kitchen MITIGARE 0.6 MG CAPS TAKE 2 CAPSULES BY MOUTH AT THE FIRST SIGN OF GOUT THEN TAKE 1 CAPSULE 2 HOURS LATER THEN 1 CAPSULE DAILY UNTIL FLARE IS RESOLVED 30 capsule 5  . predniSONE (DELTASONE) 20 MG tablet Take 2  pills for 3 days, 1 pill for 4 days 10 tablet 0   No current facility-administered medications for this visit.     Objective:  BP 124/68   Pulse 82   Temp 97.9 F (36.6 C) (Temporal)   Ht '5\' 7"'  (1.702 m)   Wt 158 lb 12.8 oz (72 kg)   SpO2 97%   BMI 24.87 kg/m  Gen: NAD, resting comfortably CV: RRR no murmurs rubs or gallops Lungs: CTAB no crackles, wheeze, rhonchi Ext: no edema Skin: warm, dry Sp[place]ling test positive on the left   Diabetic Foot Exam - Simple   Simple Foot Form Diabetic Foot exam was performed with the following findings: Yes 11/20/2019  8:46 AM  Visual Inspection No deformities, no ulcerations, no other skin breakdown bilaterally: Yes Sensation Testing Intact to touch and monofilament testing bilaterally: Yes Pulse Check Posterior Tibialis and Dorsalis pulse intact bilaterally: Yes Comments        Assessment and Plan    # shoulder/neck/arm left pain  S:tingling for about a month- 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 Starts in back of left shoulder goes down outside of left arm into all four fingers. Pain was getting up to 9-10. Not droppping objects and no weakness in arm.   Spoke to his supervisor and Since taking it easier at work with heavy lifting symptoms have stabilized. Now pain down to 7/10 or so- still taking gabapentin  Prior history:  Patient with MRI in November 12, 2017 with Dr. Paulla Fore.  Patient took gabapentin at that time and had an epidural injection and previously had tried Medrol Dosepak.  Also previously seen physical therapy.  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 Mild spinal stenosis  Left foraminal encroachment C6-7 due to disc and osteophyte complex. Mild spinal stenosis." A/P: sounds like recurrence of left cervical radiculopathy. Continue gabapentin. Will use  prednisone to try to calm down inflammation if present- as long as a1c is under 7.5.   # Diabetes S: compliant with on Metformin 1000 mg BID and Glipizide 5 mg daily.   CBGs- at home have been 130's fasting  Exercise and diet- has not been exercising much- neck and hand. Limits carbohydrates and fried  foods. Does not drink soday  Lab Results  Component Value Date   HGBA1C 6.6 (H) 07/10/2019   A/P: hopefully controlled- update a1c. Continue current meds for now  #hypertension S: compliant with Amlodipine 10 mg once daily and Quinapril 20 mg daily.  BP Readings from Last 3 Encounters:  11/20/19 124/68  07/18/19 130/82  07/10/19 118/76  A/P: Stable. Continue current medications.   # Gout S: compliant with Allopurinol 100 mg daily  and Colchicine 0.6 mg prn.  Patient's insurance is no longer going to cover Colcrys would like to talk about another option.  Lab Results  Component Value Date   LABURIC 4.4 07/09/2018  A/P: doing reasonably well- rare use of colchicine. Update uric acid with labs From avs "Usually insurance will cover either 1. colcrys 2. Colchicine 3. mitigare These are all the same medicines- pick  Up whichever one is covered- if issues with all 3- try to drop off a copy of the letter"   # mild dry mouth- encouraged regular water. Masks likely contributing  # Thrombocytopenia/leukopenia S:platelets generally around 100.  Intermittent leukopenia- typically get cbc with differential  07/04/18 path smear review "Myeloid population consists predominantly of mature  segmented neutrophils. No immature cells are identified. RBCs appear to be microcytic and hypochromic on smear review. Suggest evaluation for iron deficiency, if clinically indicated. Thrombocytopenia with some large platelets seen. No platelet clumps identified. Reviewed by Francis Gaines Mammarappallil, MD " A/P:  Hopefully stable as has been in past- update CBC. No leukopenia last check but low normal.  Thrombocytopenia suspect stable.    Recommended follow up:  [place] visit on 26th.  Return in about 4 months (around 03/19/2020) for physical or sooner if needed. Future Appointments  Date Time Provider [place]  11/25/2019  9:40 AM Yong Channel, Brayton Mars, MD [facility]    Lab/Order associations:   ICD-10-CM   1. Controlled type 2 diabetes mellitus without complication, without long-term current use of insulin (HCC)  E11.9 Hemoglobin A1c    CBC with Differential/Platelet    Comprehensive metabolic panel  2. Essential hypertension  I10   3. Idiopathic chronic gout of multiple sites without tophus  M1A.96K3 Uric acid  4. Left cervical radiculopathy  M54.12   5. Thrombocytopenia ([place])  D69.6   6. Screen for colon cancer  Z12.11 Ambulatory referral to Gastroenterology    Meds ordered this encounter  Medications  . predniSONE (DELTASONE) 20 MG tablet    Sig: Take 2 pills for 3 days, 1 pill for 4 days    Dispense:  10 tablet    Refill:  0  . colchicine (COLCRYS) 0.6 MG tablet    Sig: TAKE 2 CAPSULES BY MOUTH AT THE FIRST SIGN OF GOUT THEN TAKE 1 CAPSULE 2 HOURS LATER THEN 1 CAPSULE DAILY UNTIL FLARE IS RESOLVED    Dispense:  30 tablet    Refill:  5  . MITIGARE 0.6 MG CAPS    Sig: TAKE 2 CAPSULES BY MOUTH AT THE FIRST SIGN OF GOUT THEN TAKE 1 CAPSULE 2 HOURS LATER THEN 1 CAPSULE DAILY UNTIL FLARE IS RESOLVED    Dispense:  30 capsule    Refill:  5   Return precautions advised.  Garret Reddish, MD

## 2019-11-20 NOTE — Patient Instructions (Addendum)
Health Maintenance Due  Topic Date Due  . COLONOSCOPY  Referral started today. We will call you within two weeks about your referral. If you do not hear within 3 weeks, give [place] a call.   11/03/2019  . FOOT EXAM  Done today  11/09/2019   Lets try prednisone and heavy lifting restrictions (5 lbs maximum) to see if helps with the left arm/neck pain issues. If not improving within 2 weeks- we could refer to sports medicine. Definitely let us know if worsening.   Usually insurance will cover either 1. colcrys 2. Colchicine 3. mitigare These are all the same medicines- pick  Up whichever one is covered- if issues with all 3- try to drop off a copy of the letter  Please stop by lab before you go If you do not have mychart- we will call you about results within 5 business days of [place] receiving them.  If you have mychart- we will send your results within 3 business days of [place] receiving them.  If abnormal or we want to clarify a result, we will call or mychart you to make sure you receive the message.  If you have questions or concerns or don't hear within 5-7 days, please send [place] a message or call us.    Recommended follow up: cancel visit next week then  Return in about 4 months (around 03/19/2020) for physical or sooner if needed.

## 2019-11-21 ENCOUNTER — Other Ambulatory Visit: Payer: Self-pay

## 2019-11-21 MED ORDER — GLIPIZIDE 5 MG PO TABS: 5.0000 mg | ORAL_TABLET | Freq: Two times a day (BID) | ORAL | 2 refills | Status: DC

## 2019-11-25 ENCOUNTER — Encounter: Payer: Medicare HMO | Admitting: Family Medicine

## 2019-11-25 DIAGNOSIS — R948 Abnormal results of function studies of other organs and systems: Secondary | ICD-10-CM | POA: Diagnosis not present

## 2019-11-25 LAB — PSA: PSA: 2.09

## 2019-11-26 ENCOUNTER — Telehealth: Payer: Self-pay

## 2019-11-26 DIAGNOSIS — E119 Type 2 diabetes mellitus without complications: Secondary | ICD-10-CM

## 2019-11-26 MED ORDER — BLOOD GLUCOSE MONITOR KIT: PACK | 0 refills | Status: DC

## 2019-11-26 NOTE — Addendum Note (Signed)
Addended by: Francella Solian on: 11/26/2019 01:25 PM   Modules accepted: Orders

## 2019-11-26 NOTE — Telephone Encounter (Signed)
Script sent over

## 2019-11-26 NOTE — Telephone Encounter (Signed)
MEDICATION:ONETOUCH DELICA LANCETS FINE MISC  And blood glucose meter kit and supplies KIT  PHARMACY: [place], [place]. Phone:  [phone]  Fax:  [phone]       Comments:   **Let patient know to contact pharmacy at the end of the day to make sure medication is ready. **  ** Please notify patient to allow 48-72 hours to process**  **Encourage patient to contact the pharmacy for refills or they can request refills through [facility]**   Page

## 2019-12-02 ENCOUNTER — Other Ambulatory Visit: Payer: Self-pay

## 2019-12-02 ENCOUNTER — Telehealth: Payer: Self-pay | Admitting: Family Medicine

## 2019-12-02 DIAGNOSIS — R351 Nocturia: Secondary | ICD-10-CM | POA: Diagnosis not present

## 2019-12-02 DIAGNOSIS — N401 Enlarged prostate with lower urinary tract symptoms: Secondary | ICD-10-CM | POA: Diagnosis not present

## 2019-12-02 DIAGNOSIS — E119 Type 2 diabetes mellitus without complications: Secondary | ICD-10-CM

## 2019-12-02 MED ORDER — BLOOD GLUCOSE MONITOR KIT: PACK | 0 refills | Status: DC

## 2019-12-02 NOTE — Telephone Encounter (Signed)
Patient called in saying that he is needing another glucose meter and was wanting to know if he can get another one.  If it can be sent- [place], [place].

## 2019-12-02 NOTE — Telephone Encounter (Signed)
Meter sent in to pharmacy.

## 2019-12-11 ENCOUNTER — Other Ambulatory Visit: Payer: Self-pay | Admitting: Family Medicine

## 2019-12-17 DIAGNOSIS — H2000 Unspecified acute and subacute iridocyclitis: Secondary | ICD-10-CM | POA: Diagnosis not present

## 2019-12-17 DIAGNOSIS — H1132 Conjunctival hemorrhage, left eye: Secondary | ICD-10-CM | POA: Diagnosis not present

## 2019-12-23 DIAGNOSIS — U071 COVID-19: Secondary | ICD-10-CM | POA: Diagnosis not present

## 2019-12-24 DIAGNOSIS — H2000 Unspecified acute and subacute iridocyclitis: Secondary | ICD-10-CM | POA: Diagnosis not present

## 2020-01-02 DIAGNOSIS — H5203 Hypermetropia, bilateral: Secondary | ICD-10-CM | POA: Diagnosis not present

## 2020-01-02 DIAGNOSIS — H401131 Primary open-angle glaucoma, bilateral, mild stage: Secondary | ICD-10-CM | POA: Diagnosis not present

## 2020-01-02 DIAGNOSIS — H2513 Age-related nuclear cataract, bilateral: Secondary | ICD-10-CM | POA: Diagnosis not present

## 2020-01-02 LAB — HM DIABETES EYE EXAM

## 2020-01-05 ENCOUNTER — Encounter: Payer: Self-pay | Admitting: *Deleted

## 2020-01-08 ENCOUNTER — Other Ambulatory Visit: Payer: Self-pay | Admitting: Family Medicine

## 2020-01-13 DIAGNOSIS — U071 COVID-19: Secondary | ICD-10-CM | POA: Diagnosis not present

## 2020-01-22 ENCOUNTER — Other Ambulatory Visit: Payer: Self-pay

## 2020-01-26 ENCOUNTER — Other Ambulatory Visit: Payer: Self-pay

## 2020-01-26 ENCOUNTER — Encounter: Payer: Self-pay | Admitting: Family Medicine

## 2020-01-26 ENCOUNTER — Ambulatory Visit (INDEPENDENT_AMBULATORY_CARE_PROVIDER_SITE_OTHER): Payer: Medicare HMO | Admitting: Family Medicine

## 2020-01-26 VITALS — BP 130/78 | HR 72 | Temp 97.0°F | Ht 67.0 in | Wt 161.0 lb

## 2020-01-26 DIAGNOSIS — E1169 Type 2 diabetes mellitus with other specified complication: Secondary | ICD-10-CM

## 2020-01-26 DIAGNOSIS — M1A09X0 Idiopathic chronic gout, multiple sites, without tophus (tophi): Secondary | ICD-10-CM

## 2020-01-26 DIAGNOSIS — E119 Type 2 diabetes mellitus without complications: Secondary | ICD-10-CM | POA: Diagnosis not present

## 2020-01-26 DIAGNOSIS — I152 Hypertension secondary to endocrine disorders: Secondary | ICD-10-CM

## 2020-01-26 DIAGNOSIS — H9190 Unspecified hearing loss, unspecified ear: Secondary | ICD-10-CM | POA: Diagnosis not present

## 2020-01-26 DIAGNOSIS — E1159 Type 2 diabetes mellitus with other circulatory complications: Secondary | ICD-10-CM

## 2020-01-26 DIAGNOSIS — I10 Essential (primary) hypertension: Secondary | ICD-10-CM | POA: Diagnosis not present

## 2020-01-26 DIAGNOSIS — N401 Enlarged prostate with lower urinary tract symptoms: Secondary | ICD-10-CM | POA: Diagnosis not present

## 2020-01-26 DIAGNOSIS — Z0000 Encounter for general adult medical examination without abnormal findings: Secondary | ICD-10-CM | POA: Diagnosis not present

## 2020-01-26 DIAGNOSIS — R718 Other abnormality of red blood cells: Secondary | ICD-10-CM | POA: Diagnosis not present

## 2020-01-26 DIAGNOSIS — E785 Hyperlipidemia, unspecified: Secondary | ICD-10-CM

## 2020-01-26 MED ORDER — BLOOD GLUCOSE MONITOR KIT: PACK | 0 refills | Status: DC

## 2020-01-26 NOTE — Progress Notes (Signed)
Phone: [phone]   Subjective:  Patient presents today for their Welcome to Medicare Exam    Preventive Screening-Counseling & Management  Vision screen:  Normal with correction for both eyes. Left ear failed- refer to ENT  Hearing Screening   Method: Audiometry   '125Hz'  '250Hz'  '500Hz'  '1000Hz'  '2000Hz'  '3000Hz'  '4000Hz'  '6000Hz'  '8000Hz'   Right ear:           Left ear:           Comments: Left ear failed  Right ear pass    Visual Acuity Screening   Right eye Left eye Both eyes  Without correction:     With correction: '20/50 20/30 20/20 '    Advanced directives: None on file- gave blue packet to review and consider. He is full code.   Modifiable Risk Factors/behavioral risk assessment/psychosocial risk assessment Regular exercise: not exercising- encouraged to start Diet:  Reasonably healthy- watches carb/sugar intake  Wt Readings from Last 3 Encounters:  01/26/20 161 lb (73 kg)  11/20/19 158 lb 12.8 oz (72 kg)  07/18/19 155 lb (70.3 kg)  Smoking Status: Never Smoker Second Hand Smoking status: No smokers in home Alcohol intake: 0 per week  Cardiac risk factors:  advanced age (older than 17 for men, 96 for women)  treated Hyperlipidemia  Treated Hypertension  Mild poorly controlled diabetes. But hopefully improving with med changes Lab Results  Component Value Date   HGBA1C 7.6 (H) 11/20/2019  Family History: no heart disease in family thankfully  Family History  Problem Relation Age of Onset  . Other Father        and mother- states died of old age. States no medical problems in parents or siblings  . Colon cancer Neg Hx     Depression Screen/risk evaluation Risk factors: none.Marland Kitchen PHQ2 0  Depression screen [facility] 2/9 01/26/2020 07/10/2019 11/08/2018 07/09/2018 03/16/2017  Decreased Interest 0 0 0 0 0  Down, Depressed, Hopeless 0 0 0 0 0  PHQ - 2 Score 0 0 0 0 0    Functional ability and level of safety Mobility assessment:  timed get up and go <12 seconds Activities of Daily  Living- Independent in ADLs (toileting, bathing, dressing, transferring, eating) and in IADLs (shopping, housekeeping, managing own medications, and handling finances) Home Safety: Loose rugs (no), smoke detectors (up to date), small pets (no), grab bars (yes), stairs (10 in home and he has no issues), life-alert system (would use phone) Hearing Difficulties:-patient endorses occasional issues- testing as above. Gave handout of potential audiologists and sent referral Fall Risk: None  Fall Risk  01/26/2020 07/10/2019 07/09/2018 03/16/2017 11/29/2015  Falls in the past year? 0 0 No No No  Number falls in past yr: 0 0 - - -  Injury with Fall? - 0 - - -  Opioid use history:  no long term opioids use- other than short term with surgery Self assessment of health status: "good"  Required Immunizations needed today:  none Immunization History  Administered Date(s) Administered  . Fluad Quad(high Dose 65+) 07/10/2019  . Influenza Split 07/31/2011  . Influenza, High Dose Seasonal PF 09/01/2017  . Influenza-Unspecified 07/16/2015, 08/14/2018  . Moderna SARS-COVID-2 Vaccination 10/27/2019, 11/24/2019  . Pneumococcal Conjugate-13 03/29/2017, 02/12/2018  . Pneumococcal Polysaccharide-23 07/31/2011, 07/10/2019  . Tdap 11/29/2015  . Zoster Recombinat (Shingrix) 02/12/2018, 05/01/2018   Health Maintenance  Topic Date Due  . Colon Cancer Screening  11/03/2019  . Hemoglobin A1C  05/19/2020  . Complete foot exam   11/19/2020  . Eye exam  for diabetics  01/01/2021  . Tetanus Vaccine  11/28/2025  . Flu Shot  Completed  . Pneumonia vaccines  Completed  .  Hepatitis C: One time screening is recommended by Center for Disease Control  (CDC) for  adults born from [birth year] through 1966.   Addressed    Screening tests-  1. Colon cancer screening- due for colonoscopy- he agrees to call 2. Lung Cancer screening- not a candidate 3. Skin cancer screening- low risk due to melanin content 4. Prostate cancer  screening/s/p prostatectomy- recently checked and we deferred today Lab Results  Component Value Date   PSA 2.09 11/25/2019   PSA 4.35 08/21/2017   PSA 12.90 (H) 06/15/2014   The following were reviewed and entered/updated in epic: Past Medical History:  Diagnosis Date  . 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 Diabetes mellitus without complication ([place])   . Difficult intubation    "mouth was not wide enough"  . Hypertension   . Prostate enlargement 2010   See's Urologist    Patient Active Problem List   Diagnosis Date Noted  . Diabetes mellitus type II, controlled ([place]) 04/09/2013    Priority: High  . 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 Hyperlipidemia associated with type 2 diabetes mellitus ([place]) 07/09/2018    Priority: Medium  . Left cervical radiculopathy 09/24/2017    Priority: Medium  . History of adenomatous polyp of colon 11/06/2016    Priority: Medium  . Chronic ankle pain 06/14/2016    Priority: Medium  . Thrombocytopenia ([place]) 11/29/2015    Priority: Medium  . Gout 04/30/2015    Priority: Medium  . Hypertension associated with diabetes ([place]) 04/09/2013    Priority: Medium  . BPH (benign prostatic hyperplasia) 04/09/2013    Priority: Medium  . Leg length difference, acquired 06/20/2019    Priority: Low  . Arthritis of first metatarsophalangeal (MTP) joint of right foot 05/30/2019    Priority: Low  . Sesamoiditis 11/08/2018    Priority: Low  . Osteoarthritis of spine with radiculopathy, cervical region 11/14/2017    Priority: Low  . Right knee pain 11/21/2016    Priority: Low  . Erectile dysfunction 02/04/2015    Priority: Low   Past Surgical History:  Procedure Laterality Date  . COLONOSCOPY WITH PROPOFOL N/A 11/02/2016   Procedure: COLONOSCOPY WITH PROPOFOL;  Surgeon: Gatha Mayer, MD;  Location: [facility];  Service: Endoscopy;  Laterality: N/A;  . LEG SURGERY Right 1997     accident related  . XI ROBOTIC ASSISTED SIMPLE PROSTATECTOMY N/A 10/05/2017   Procedure: XI ROBOTIC ASSISTED SIMPLE PROSTATECTOMY WITH UMBILICAL HERNIA REPAIR;  Surgeon: Cleon Gustin, MD;  Location: [facility] ORS;  Service: Urology;  Laterality: N/A;    Family History  Problem Relation Age of Onset  . Other Father        and mother- states died of old age. States no medical problems in parents or siblings  . Colon cancer Neg Hx     Medications- reviewed and updated Current Outpatient Medications  Medication Sig Dispense Refill  . acetaminophen (TYLENOL) 500 MG tablet Take 1,000 mg by mouth every 6 (six) hours as needed for moderate pain.     Marland Kitchen allopurinol (ZYLOPRIM) 100 MG tablet Take 1 tablet by mouth once daily 30 tablet 0  . amLODipine (NORVASC) 10 MG tablet Take 1 tablet by mouth once daily 90 tablet 0  . ammonium lactate (LAC-HYDRIN) 12 % cream  Apply to the area of callus 2-3 times a day 385 g 2  . atorvastatin (LIPITOR) 10 MG tablet Take 1 tablet (10 mg total) by mouth once a week. 13 tablet 3  . blood glucose meter kit and supplies KIT . Use up to four times daily as directed. 1 each 0  . Camphor-Eucalyptus-Menthol (VICKS VAPORUB EX) Apply 1 application topically at bedtime as needed (pain/ congestions).    . colchicine (COLCRYS) 0.6 MG tablet TAKE 2 CAPSULES BY MOUTH AT THE FIRST SIGN OF GOUT THEN TAKE 1 CAPSULE 2 HOURS LATER THEN 1 CAPSULE DAILY UNTIL FLARE IS RESOLVED 30 tablet 5  . diclofenac sodium (VOLTAREN) 1 % GEL APPLY 2 GRAMS TOPICALLY  TO AFFECTED AREA 4 TIMES DAILY 100 g 3  . feeding supplement, ENSURE ENLIVE, (ENSURE ENLIVE) LIQD Take 1 Bottle by mouth daily.    Marland Kitchen gabapentin (NEURONTIN) 300 MG capsule TAKE 1 CAPSULE BY MOUTH 4 TIMES DAILY OR  AS  DIRECTED 360 capsule 0  . glipiZIDE (GLUCOTROL) 5 MG tablet Take 1 tablet (5 mg total) by mouth 2 (two) times daily before a meal. 180 tablet 2  . glucose blood (ACCU-CHEK SMARTVIEW) test strip Use to test blood sugar daily  and prn 100 each 4  . metFORMIN (GLUCOPHAGE) 1000 MG tablet TAKE 1 TABLET BY MOUTH TWICE DAILY WITH MEALS 180 tablet 0  . MITIGARE 0.6 MG CAPS TAKE 2 CAPSULES BY MOUTH AT THE FIRST SIGN OF GOUT THEN TAKE 1 CAPSULE 2 HOURS LATER THEN 1 CAPSULE DAILY UNTIL FLARE IS RESOLVED 30 capsule 5  . quinapril (ACCUPRIL) 20 MG tablet TAKE 1 TABLET BY MOUTH AT BEDTIME 90 tablet 0  . Tetrahyd-Glyc-Hypro-PEG-ZnSulf (VISINE TOTALITY MULTI-SYMPTOM OP) Apply 1 drop to eye daily as needed (dry eyes).     No current facility-administered medications for this visit.    Allergies-reviewed and updated No Known Allergies  Social History   Socioeconomic History  . Marital status: Married    Spouse name: Not on file  . Number of children: Not on file  . Years of education: Not on file  . Highest education level: Not on file  Occupational History  . Not on file  Tobacco Use  . Smoking status: Never Smoker  . Smokeless tobacco: Never Used  Substance and Sexual Activity  . Alcohol use: No    Alcohol/week: 0.0 standard drinks  . Drug use: No  . Sexual activity: Not on file  Other Topics Concern  . Not on file  Social History Narrative   Married. 4 children. 5 grandkids- new grandchild 10/2015 included.    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       Hobbies: enjoys reading novels. Enjoys African authors   Goes to [place]   Social Determinants of Radio broadcast assistant Strain:   . Difficulty of Paying Living Expenses:   Food Insecurity:   . Worried About Charity fundraiser in the Last Year:   . Arboriculturist in the Last Year:   Transportation Needs:   . Film/video editor (Medical):   Marland Kitchen Lack of Transportation (Non-Medical):   Physical Activity:   . Days of Exercise per Week:   . Minutes of Exercise per Session:   Stress:   . Feeling of Stress :   Social Connections:    . Frequency of  Communication with Friends and Family:   . Frequency of Social Gatherings with Friends and Family:   . Attends Religious Services:   . Active Member of Clubs or Organizations:   . Attends Archivist Meetings:   Marland Kitchen Marital Status:    Objective  Objective:  BP 130/78   Pulse 72   Temp (!) 97 F (36.1 C) (Temporal)   Ht '5\' 7"'  (1.702 m)   Wt 161 lb (73 kg)   SpO2 97%   BMI 25.22 kg/m  Gen: NAD, resting comfortably HEENT: Mask not removed due to covid 19. TM normal. Bridge of nose normal. Eyelids normal.  Neck: no thyromegaly or cervical lymphadenopathy  CV: RRR no murmurs rubs or gallops Lungs: CTAB no crackles, wheeze, rhonchi Abdomen: soft/nontender/nondistended/normal bowel sounds. No rebound or guarding.  Ext: no edema Skin: warm, dry Neuro: grossly normal, moves all extremities, PERRLA Msk: leg length discrepancy   Assessment and Plan:   Welcome to Medicare exam completed-  1. Educated, counseled and referred based on above elements 2. Educated, counseled and referred as appropriate for preventative needs 3. Discussed and documented a written plan for preventiative services and screenings with personalized health advice- After Visit Summary was given to patient which included this plan  4. EKG offered [id number]- patient agrees-   EKG: sinus rhythm with rate 68, normal axis, normal intervals, no hypertrophy, no st or t wave changes- less than 2 box elevation v1-v4. ekg unchanged form 2015 ekg. Patient without any cardiac symtpoms  Status of chronic or acute concerns  See physical from today  Recommended follow up: 1 year awv for wellness evaluation [place]- 6 months for diabetes follow up  Lab/Order associations:   ICD-10-CM  1. Preventative health care  Z00.00   Return precautions advised. Garret Reddish, MD

## 2020-01-26 NOTE — Patient Instructions (Addendum)
Health Maintenance Due  Topic Date Due  . COLONOSCOPY  Patient declined for now  11/03/2019   Vision screen today.  Hearing screen today.  EKG today   Preston Pennington , Thank you for taking time to come for your Medicare Wellness Visit. I appreciate your ongoing commitment to your health goals. Please review the following plan we discussed and let me know if I can assist you in the future.   These are the goals we discussed: 1.  he is going to come back April 22nd or later for labs Schedule a lab visit at the check out desk for that time.  Return for future fasting labs meaning nothing but water after midnight please. Ok to take your medications with water.  2. Would encourage walking 150 minutes a week outside of work 3. Lets work on healthy eating to help bring sugars back down   This is a list of the screening recommended for you and due dates:  Health Maintenance  Topic Date Due  . Colon Cancer Screening  11/03/2019  . Hemoglobin A1C  05/19/2020  . Complete foot exam   11/19/2020  . Eye exam for diabetics  01/01/2021  . Tetanus Vaccine  11/28/2025  . Flu Shot  Completed  . Pneumonia vaccines  Completed  .  Hepatitis C: One time screening is recommended by Center for Disease Control  (CDC) for  adults born from [birth year] through 1966.   Addressed     Recommended follow up: Return in about 6 months (around 07/28/2020) for follow up- or sooner if needed. as long as a1c improves at follow up

## 2020-01-26 NOTE — Progress Notes (Deleted)
Phone: [phone]   Subjective:  Patient presents today for their Welcome to Medicare Exam    Preventive Screening-Counseling & Management  Vision screen: *** No exam data present  Advanced directives: ***  Modifiable Risk Factors/behavioral risk assessment/psychosocial risk assessment Regular exercise: *** Diet: ***  Wt Readings from Last 3 Encounters:  11/20/19 158 lb 12.8 oz (72 kg)  07/18/19 155 lb (70.3 kg)  07/10/19 154 lb 6.4 oz (70 kg)   Smoking Status: ***Never Smoker Second Hand Smoking status: ***No smokers in home Alcohol intake: *** per week  Cardiac risk factors:  advanced age (older than 53 for men, 28 for women) *** *** Hyperlipidemia *** *** Hypertension *** No diabetes. *** Lab Results  Component Value Date   HGBA1C 7.6 (H) 11/20/2019   Family History: ***  Family History  Problem Relation Age of Onset  . Other Father        and mother- states died of old age. States no medical problems in parents or siblings  . Colon cancer Neg Hx     Depression Screen/risk evaluation Risk factors: ***.. PHQ2 0 *** Depression screen [facility] 2/9 07/10/2019 11/08/2018 07/09/2018 03/16/2017 11/29/2015  Decreased Interest 0 0 0 0 0  Down, Depressed, Hopeless 0 0 0 0 0  PHQ - 2 Score 0 0 0 0 0    Functional ability and level of safety Mobility assessment: *** timed get up and go <12 seconds Activities of Daily Living- Independent in ADLs (toileting, bathing, dressing, transferring, eating) and in IADLs (shopping, housekeeping, managing own medications, and handling finances)*** Home Safety: Loose rugs (***), smoke detectors (***), small pets (***), grab bars (***), stairs (***), life-alert system (***) Hearing Difficulties: ***-patient declines Fall Risk: ***None  Fall Risk  07/10/2019 07/09/2018 03/16/2017 11/29/2015  Falls in the past year? 0 No No No  Number falls in past yr: 0 - - -  Injury with Fall? 0 - - -   Opioid use history: *** no long term opioids use Self  assessment of health status: ***  Required Immunizations needed today:  *** Immunization History  Administered Date(s) Administered  . Fluad Quad(high Dose 65+) 07/10/2019  . Influenza Split 07/31/2011  . Influenza, High Dose Seasonal PF 09/01/2017  . Influenza-Unspecified 07/16/2015, 08/14/2018  . Pneumococcal Conjugate-13 03/29/2017, 02/12/2018  . Pneumococcal Polysaccharide-23 07/31/2011, 07/10/2019  . Tdap 11/29/2015  . Zoster Recombinat (Shingrix) 02/12/2018, 05/01/2018   Health Maintenance  Topic Date Due  . Colon Cancer Screening  11/03/2019  . Hemoglobin A1C  05/19/2020  . Complete foot exam   11/19/2020  . Eye exam for diabetics  01/01/2021  . Tetanus Vaccine  11/28/2025  . Flu Shot  Completed  . Pneumonia vaccines  Completed  .  Hepatitis C: One time screening is recommended by Center for Disease Control  (CDC) for  adults born from [birth year] through 1966.   Addressed    Screening tests-  Health Maintenance Due  Topic Date Due  . COLONOSCOPY  11/03/2019   1. Colon cancer screening- *** 2. Lung Cancer screening- *** 3. Skin cancer screening- *** 4. Prostate cancer screening Lab Results  Component Value Date   PSA 2.09 11/25/2019   PSA 4.35 08/21/2017   PSA 12.90 (H) 06/15/2014    4. Cervical cancer screening- *** 5. Breast cancer screening- ***  The following were reviewed and entered/updated in epic: Past Medical History:  Diagnosis Date  . 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 Diabetes mellitus without complication ([place])   .  Difficult intubation    "mouth was not wide enough"  . Hypertension   . Prostate enlargement 2010   See's Urologist    Patient Active Problem List   Diagnosis Date Noted  . Leg length difference, acquired 06/20/2019  . Arthritis of first metatarsophalangeal (MTP) joint of right foot 05/30/2019  . 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 Sesamoiditis 11/08/2018  . Hyperlipidemia  associated with type 2 diabetes mellitus ([place]) 07/09/2018  . Osteoarthritis of spine with radiculopathy, cervical region 11/14/2017  . Left cervical radiculopathy 09/24/2017  . Right knee pain 11/21/2016  . History of adenomatous polyp of colon 11/06/2016  . Chronic ankle pain 06/14/2016  . Thrombocytopenia ([place]) 11/29/2015  . Gout 04/30/2015  . Erectile dysfunction 02/04/2015  . Essential hypertension 04/09/2013  . Diabetes mellitus type II, controlled ([place]) 04/09/2013  . BPH (benign prostatic hyperplasia) 04/09/2013   Past Surgical History:  Procedure Laterality Date  . COLONOSCOPY WITH PROPOFOL N/A 11/02/2016   Procedure: COLONOSCOPY WITH PROPOFOL;  Surgeon: Gatha Mayer, MD;  Location: [facility];  Service: Endoscopy;  Laterality: N/A;  . LEG SURGERY Right 1997   accident related  . XI ROBOTIC ASSISTED SIMPLE PROSTATECTOMY N/A 10/05/2017   Procedure: XI ROBOTIC ASSISTED SIMPLE PROSTATECTOMY WITH UMBILICAL HERNIA REPAIR;  Surgeon: Cleon Gustin, MD;  Location: [facility];  Service: Urology;  Laterality: N/A;    Family History  Problem Relation Age of Onset  . Other Father        and mother- states died of old age. States no medical problems in parents or siblings  . Colon cancer Neg Hx     Medications- reviewed and updated Current Outpatient Medications  Medication Sig Dispense Refill  . acetaminophen (TYLENOL) 500 MG tablet Take 1,000 mg by mouth every 6 (six) hours as needed for moderate pain.     Marland Kitchen allopurinol (ZYLOPRIM) 100 MG tablet Take 1 tablet by mouth once daily 30 tablet 0  . amLODipine (NORVASC) 10 MG tablet Take 1 tablet by mouth once daily 90 tablet 0  . ammonium lactate (LAC-HYDRIN) 12 % cream Apply to the area of callus 2-3 times a day 385 g 2  . atorvastatin (LIPITOR) 10 MG tablet Take 1 tablet (10 mg total) by mouth once a week. 13 tablet 3  . blood glucose meter kit and supplies KIT . Use up to four times daily as directed. 1 each 0  . blood glucose  meter kit and supplies KIT Dispense based on patient and insurance preference. Use 2 times daily as directed. E11.9 Accu-Check Smart View Meter 1 each 0  . Camphor-Eucalyptus-Menthol (VICKS VAPORUB EX) Apply 1 application topically at bedtime as needed (pain/ congestions).    . colchicine (COLCRYS) 0.6 MG tablet TAKE 2 CAPSULES BY MOUTH AT THE FIRST SIGN OF GOUT THEN TAKE 1 CAPSULE 2 HOURS LATER THEN 1 CAPSULE DAILY UNTIL FLARE IS RESOLVED 30 tablet 5  . diclofenac sodium (VOLTAREN) 1 % GEL APPLY 2 GRAMS TOPICALLY  TO AFFECTED AREA 4 TIMES DAILY 100 g 3  . feeding supplement, ENSURE ENLIVE, (ENSURE ENLIVE) LIQD Take 1 Bottle by mouth daily.    Marland Kitchen gabapentin (NEURONTIN) 300 MG capsule TAKE 1 CAPSULE BY MOUTH 4 TIMES DAILY OR  AS  DIRECTED 360 capsule 0  . glipiZIDE (GLUCOTROL) 5 MG tablet Take 1 tablet (5 mg total) by mouth 2 (two) times daily before a meal. 180 tablet 2  . glucose blood (ACCU-CHEK SMARTVIEW) test strip Use to test  blood sugar daily and prn 100 each 4  . metFORMIN (GLUCOPHAGE) 1000 MG tablet TAKE 1 TABLET BY MOUTH TWICE DAILY WITH MEALS 180 tablet 0  . MITIGARE 0.6 MG CAPS TAKE 2 CAPSULES BY MOUTH AT THE FIRST SIGN OF GOUT THEN TAKE 1 CAPSULE 2 HOURS LATER THEN 1 CAPSULE DAILY UNTIL FLARE IS RESOLVED 30 capsule 5  . ONETOUCH DELICA LANCETS FINE MISC Use to check blood sugar once a day. E11.9 90 each 3  . predniSONE (DELTASONE) 20 MG tablet Take 2 pills for 3 days, 1 pill for 4 days 10 tablet 0  . quinapril (ACCUPRIL) 20 MG tablet TAKE 1 TABLET BY MOUTH AT BEDTIME 90 tablet 0  . Tetrahyd-Glyc-Hypro-PEG-ZnSulf (VISINE TOTALITY MULTI-SYMPTOM OP) Apply 1 drop to eye daily as needed (dry eyes).     No current facility-administered medications for this visit.    Allergies-reviewed and updated No Known Allergies  Social History   Socioeconomic History  . Marital status: Married    Spouse name: Not on file  . Number of children: Not on file  . Years of education: Not on file  .  Highest education level: Not on file  Occupational History  . Not on file  Tobacco Use  . Smoking status: Never Smoker  . Smokeless tobacco: Never Used  Substance and Sexual Activity  . Alcohol use: No    Alcohol/week: 0.0 standard drinks  . Drug use: No  . Sexual activity: Not on file  Other Topics Concern  . Not on file  Social History Narrative   Married. 4 children. 5 grandkids- new grandchild 10/2015 included.    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       Hobbies: enjoys reading novels. Enjoys African authors   Goes to [place] ([place]   Social Determinants of Radio broadcast assistant Strain:   . Difficulty of Paying Living Expenses:   Food Insecurity:   . Worried About Charity fundraiser in the Last Year:   . Arboriculturist in the Last Year:   Transportation Needs:   . Film/video editor (Medical):   Marland Kitchen Lack of Transportation (Non-Medical):   Physical Activity:   . Days of Exercise per Week:   . Minutes of Exercise per Session:   Stress:   . Feeling of Stress :   Social Connections:   . Frequency of Communication with Friends and Family:   . Frequency of Social Gatherings with Friends and Family:   . Attends Religious Services:   . Active Member of Clubs or Organizations:   . Attends Archivist Meetings:   Marland Kitchen Marital Status:    Objective  Objective:  There were no vitals taken for this visit. Gen: NAD, resting comfortably HEENT: Mucous membranes are moist. Oropharynx normal Neck: no thyromegaly CV: RRR no murmurs rubs or gallops Lungs: CTAB no crackles, wheeze, rhonchi Abdomen: soft/nontender/nondistended/normal bowel sounds. No rebound or guarding.  Ext: no edema Skin: warm, dry Neuro: grossly normal, moves all extremities, PERRLA   Assessment and Plan:   Welcome to Medicare exam completed-  1. Educated, counseled and  referred based on above elements 2. Educated, counseled and referred as appropriate for preventative needs 3. Discussed and documented a written plan for preventiative services and screenings with personalized health advice- After Visit Summary was given to patient which included this plan ***  4. EKG offered [id number]- patient ***  Status of chronic or acute concerns  *** # Diabetes S: compliant with on Metformin 1000 mg BID and Glipizide 5 mg daily.  *** CBGs- *** Exercise and diet- ***  A/P: ***  #hypertension S: compliant with Amlodipine 10 mg once daily and Quinapril 20 mg daily.  A/P: ***  #hyperlipidemia S: compliant with Atorvastatin 10 mg once weekly.   A/P: ***  % BPH status post robotic prostatectomy December 2018- Follows with Dr. Alyson Ingles at [facility] urology.   For elevated PSA-biopsy in 2017 reassuring.  # Gout S: compliant with Allopurinol 100 mg daily and Colchicine 0.6 mg prn.   A/P: ***   # Thrombocytopenia/leukopenia S:platelets generally around 100.  Intermittent leukopenia- typically get cbc with differential  07/04/18 path smear review "Myeloid population consists predominantly of mature  segmented neutrophils. No immature cells are identified. RBCs appear to be microcytic and hypochromic on smear review. Suggest evaluation for iron deficiency, if clinically indicated. Thrombocytopenia with some large platelets seen. No platelet clumps identified. Reviewed by Francis Gaines Mammarappallil, MD " A/P: ***   No problem-specific Assessment & Plan notes found for this encounter.   Recommended follow up: *** Future Appointments  Date Time Provider [place]  01/26/2020  9:20 AM Marin Olp, MD [facility]     Lab/Order associations: No diagnosis found.  No orders of the defined types were placed in this encounter.   Return precautions advised. Francella Solian, CMA

## 2020-01-26 NOTE — Progress Notes (Signed)
Phone: [phone]   Subjective:  Patient presents today for their annual physical. Chief complaint-noted.   See problem oriented charting- ROS- full  review of systems was completed and negative  Including No chest pain or shortness of breath. No headache or blurry vision.   The following were reviewed and entered/updated in epic: Past Medical History:  Diagnosis Date  . 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 Diabetes mellitus without complication ([place])   . Difficult intubation    "mouth was not wide enough"  . Hypertension   . Prostate enlargement 2010   See's Urologist    Patient Active Problem List   Diagnosis Date Noted  . Diabetes mellitus type II, controlled ([place]) 04/09/2013    Priority: High  . 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 Hyperlipidemia associated with type 2 diabetes mellitus ([place]) 07/09/2018    Priority: Medium  . Left cervical radiculopathy 09/24/2017    Priority: Medium  . History of adenomatous polyp of colon 11/06/2016    Priority: Medium  . Chronic ankle pain 06/14/2016    Priority: Medium  . Thrombocytopenia ([place]) 11/29/2015    Priority: Medium  . Gout 04/30/2015    Priority: Medium  . Hypertension associated with diabetes ([place]) 04/09/2013    Priority: Medium  . BPH (benign prostatic hyperplasia) 04/09/2013    Priority: Medium  . Leg length difference, acquired 06/20/2019    Priority: Low  . Arthritis of first metatarsophalangeal (MTP) joint of right foot 05/30/2019    Priority: Low  . Sesamoiditis 11/08/2018    Priority: Low  . Osteoarthritis of spine with radiculopathy, cervical region 11/14/2017    Priority: Low  . Right knee pain 11/21/2016    Priority: Low  . Erectile dysfunction 02/04/2015    Priority: Low   Past Surgical History:  Procedure Laterality Date  . COLONOSCOPY WITH PROPOFOL N/A 11/02/2016   Procedure: COLONOSCOPY WITH PROPOFOL;  Surgeon: Gatha Mayer, MD;  Location: [facility];  Service: Endoscopy;  Laterality: N/A;  . LEG SURGERY Right 1997   accident related  . XI ROBOTIC ASSISTED SIMPLE PROSTATECTOMY N/A 10/05/2017   Procedure: XI ROBOTIC ASSISTED SIMPLE PROSTATECTOMY WITH UMBILICAL HERNIA REPAIR;  Surgeon: Cleon Gustin, MD;  Location: [facility];  Service: Urology;  Laterality: N/A;    Family History  Problem Relation Age of Onset  . Other Father        and mother- states died of old age. States no medical problems in parents or siblings  . Colon cancer Neg Hx     Medications- reviewed and updated Current Outpatient Medications  Medication Sig Dispense Refill  . acetaminophen (TYLENOL) 500 MG tablet Take 1,000 mg by mouth every 6 (six) hours as needed for moderate pain.     Marland Kitchen allopurinol (ZYLOPRIM) 100 MG tablet Take 1 tablet by mouth once daily 30 tablet 0  . amLODipine (NORVASC) 10 MG tablet Take 1 tablet by mouth once daily 90 tablet 0  . ammonium lactate (LAC-HYDRIN) 12 % cream Apply to the area of callus 2-3 times a day 385 g 2  . atorvastatin (LIPITOR) 10 MG tablet Take 1 tablet (10 mg total) by mouth once a week. 13 tablet 3  . blood glucose meter kit and supplies KIT . Use up to four times daily as directed. 1 each 0  . Camphor-Eucalyptus-Menthol (VICKS VAPORUB EX) Apply 1 application topically at bedtime as needed (pain/ congestions).    Marland Kitchen  colchicine (COLCRYS) 0.6 MG tablet TAKE 2 CAPSULES BY MOUTH AT THE FIRST SIGN OF GOUT THEN TAKE 1 CAPSULE 2 HOURS LATER THEN 1 CAPSULE DAILY UNTIL FLARE IS RESOLVED 30 tablet 5  . diclofenac sodium (VOLTAREN) 1 % GEL APPLY 2 GRAMS TOPICALLY  TO AFFECTED AREA 4 TIMES DAILY 100 g 3  . feeding supplement, ENSURE ENLIVE, (ENSURE ENLIVE) LIQD Take 1 Bottle by mouth daily.    Marland Kitchen gabapentin (NEURONTIN) 300 MG capsule TAKE 1 CAPSULE BY MOUTH 4 TIMES DAILY OR  AS  DIRECTED 360 capsule 0  . glipiZIDE (GLUCOTROL) 5 MG tablet Take 1 tablet (5 mg total) by mouth 2 (two) times daily  before a meal. 180 tablet 2  . glucose blood (ACCU-CHEK SMARTVIEW) test strip Use to test blood sugar daily and prn 100 each 4  . metFORMIN (GLUCOPHAGE) 1000 MG tablet TAKE 1 TABLET BY MOUTH TWICE DAILY WITH MEALS 180 tablet 0  . MITIGARE 0.6 MG CAPS TAKE 2 CAPSULES BY MOUTH AT THE FIRST SIGN OF GOUT THEN TAKE 1 CAPSULE 2 HOURS LATER THEN 1 CAPSULE DAILY UNTIL FLARE IS RESOLVED 30 capsule 5  . quinapril (ACCUPRIL) 20 MG tablet TAKE 1 TABLET BY MOUTH AT BEDTIME 90 tablet 0  . Tetrahyd-Glyc-Hypro-PEG-ZnSulf (VISINE TOTALITY MULTI-SYMPTOM OP) Apply 1 drop to eye daily as needed (dry eyes).     No current facility-administered medications for this visit.    Allergies-reviewed and updated No Known Allergies  Social History   Social History Narrative   Married. 4 children. 5 grandkids- new grandchild 10/2015 included.    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       Hobbies: enjoys reading novels. Enjoys African authors   Goes to [place]   Objective  Objective:  BP 130/78   Pulse 72   Temp (!) 97 F (36.1 C) (Temporal)   Ht 5' 7" (1.702 m)   Wt 161 lb (73 kg)   SpO2 97%   BMI 25.22 kg/m  Gen: NAD, resting comfortably HEENT: Mask not removed due to covid 19. TM normal. Bridge of nose normal. Eyelids normal.  Neck: no thyromegaly or cervical lymphadenopathy  CV: RRR no murmurs rubs or gallops Lungs: CTAB no crackles, wheeze, rhonchi Abdomen: soft/nontender/nondistended/normal bowel sounds. No rebound or guarding.  Ext: no edema Skin: warm, dry Neuro: grossly normal, moves all extremities, PERRLA    Assessment and Plan  69 y.o. male presenting for annual physical.  Health Maintenance counseling: 1. Anticipatory guidance: Patient counseled regarding regular dental exams - encouraged q6 months- he states has never had, eye exams -yearly,  avoiding smoking and second  hand smoke , limiting alcohol to 2 beverages per day - doesn't drink.   2. Risk factor reduction:  Advised patient of need for regular exercise and diet rich and fruits and vegetables to reduce risk of heart attack and stroke. Exercise- limited- encouraged increase. Diet-Feels like overall pretty healthy -tries to do fair amount of veggies nad low cars.  Wt Readings from Last 3 Encounters:  01/26/20 161 lb (73 kg)  11/20/19 158 lb 12.8 oz (72 kg)  07/18/19 155 lb (70.3 kg)  3. Immunizations/screenings/ancillary studies- fully up to date Immunization History  Administered Date(s) Administered  . Fluad Quad(high Dose 65+) 07/10/2019  . Influenza Split 07/31/2011  . Influenza, High Dose Seasonal PF 09/01/2017  . Influenza-Unspecified 07/16/2015, 08/14/2018  .  Moderna SARS-COVID-2 Vaccination 10/27/2019, 11/24/2019  . Pneumococcal Conjugate-13 03/29/2017, 02/12/2018  . Pneumococcal Polysaccharide-23 07/31/2011, 07/10/2019  . Tdap 11/29/2015  . Zoster Recombinat (Shingrix) 02/12/2018, 05/01/2018  4. Prostate cancer screening- just check PSA about 2 months ago-we will defer for now-status post robotic prostatectomy-suspect will trend down more Lab Results  Component Value Date   PSA 2.09 11/25/2019   PSA 4.35 08/21/2017   PSA 12.90 (H) 06/15/2014   5. Colon cancer screening -November 02, 2016 with 3-year follow-up recommended due to 3 adenomatous colon polyps -now overdue and recommended this today- he has their # and plans to call to schedule 6. Skin cancer screening-low risk due to melanin content.Marland Kitchen advised regular sunscreen use. Denies worrisome, changing, or new skin lesions.  7.  Never smoker  Status of chronic or acute concerns   # Diabetes S: compliant with on Metformin 1000 mg BID and Glipizide 5 mg twice daily.    Lab Results  Component Value Date   HGBA1C 7.6 (H) 11/20/2019   HGBA1C 6.6 (H) 07/10/2019   HGBA1C 6.5 03/11/2019  CBGs- sugars max 171 including non fasting other  than 1 outlier at 196. He states he is trying to watch what he is eating but if has heavier meal night before sugars may run up some Exercise and diet- not exercising much- encouraged him to work on this  A/P: Unfortunately too early for repeat A1c today-hoping the increased to glipizide 5 mg twice daily from daily is helping- he is going to come back April 22nd or later for labs  #hypertension S: compliant with Amlodipine 10 mg once daily and Quinapril 20 mg daily.  A/P: Stable. Continue current medications.   #hyperlipidemia S: compliant with Atorvastatin 10 mg once weekly. No side effects Lab Results  Component Value Date   CHOL 140 07/10/2019   HDL 42.20 07/10/2019   LDLCALC 83 07/10/2019   LDLDIRECT 92.0 03/11/2019   TRIG 76.0 07/10/2019   CHOLHDL 3 07/10/2019    A/P: Reasonable control with LDL at 83-would prefer under 70 if possible given diabetes.  We will update lipid panel and consider increased dose if above goal  % BPH status post robotic prostatectomy December 2018- Follows with Dr. Alyson Ingles at [facility] urology.   For elevated PSA-biopsy in 2017 reassuring.  # Gout S: compliant with Allopurinol 100 mg daily and Colchicine 0.6 mg prn.    Lab Results  Component Value Date   LABURIC 4.5 11/20/2019   A/P: Last uric acid level well-controlled-continue current medications  # Thrombocytopenia/leukopenia S:platelets generally around 100.  Intermittent leukopenia- typically get cbc with differential  07/04/18 path smear review "Myeloid population consists predominantly of mature  segmented neutrophils. No immature cells are identified. RBCs appear to be microcytic and hypochromic on smear review. Suggest evaluation for iron deficiency, if clinically indicated. Thrombocytopenia with some large platelets seen. No platelet clumps identified. Reviewed by Francis Gaines Mammarappallil, MD "  Iron levels normal 07/10/2019 A/P: long term issue and stable for most part- update cbc with  differential- if worsened could consider hematology referral   Recommended follow up: Return in about 6 months (around 07/28/2020) for follow up- or sooner if needed.  Lab/Order associations:  fasting   ICD-10-CM   1. Preventative health care  Z00.00 CBC with Differential/Platelet    Comprehensive metabolic panel    LDL cholesterol, direct    Hemoglobin A1c  2. Controlled type 2 diabetes mellitus without complication, without long-term current use of insulin (HCC)  E11.9 CBC with  Differential/Platelet    Comprehensive metabolic panel    LDL cholesterol, direct    Hemoglobin A1c  3. Hypertension associated with diabetes ([place])  E11.59    I10   4. Hyperlipidemia associated with type 2 diabetes mellitus (HCC)  E11.69 CBC with Differential/Platelet   E78.5 Comprehensive metabolic panel    LDL cholesterol, direct  5. Benign prostatic hyperplasia with lower urinary tract symptoms, symptom details unspecified  N40.1   6. Idiopathic chronic gout of multiple sites without tophus  M1A.09X0   7. RBC microcytosis  R71.8 CANCELED: IBC + Ferritin    Meds ordered this encounter  Medications  . blood glucose meter kit and supplies KIT    Sig: . Use up to four times daily as directed.    Dispense:  1 each    Refill:  0    Dispense based on patient and insurance preference. Dx E11.9    Order Specific Question:   Number of strips    Answer:   100    Order Specific Question:   Number of lancets    Answer:   100    Return precautions advised.  Garret Reddish, MD

## 2020-02-03 ENCOUNTER — Encounter: Payer: Self-pay | Admitting: Family Medicine

## 2020-02-09 ENCOUNTER — Other Ambulatory Visit: Payer: Self-pay | Admitting: Family Medicine

## 2020-02-11 ENCOUNTER — Other Ambulatory Visit: Payer: Self-pay | Admitting: Family Medicine

## 2020-02-23 ENCOUNTER — Other Ambulatory Visit: Payer: Self-pay | Admitting: Family Medicine

## 2020-02-24 ENCOUNTER — Other Ambulatory Visit: Payer: Medicare HMO

## 2020-02-24 DIAGNOSIS — H903 Sensorineural hearing loss, bilateral: Secondary | ICD-10-CM | POA: Diagnosis not present

## 2020-02-24 NOTE — Telephone Encounter (Signed)
  LAST APPOINTMENT DATE: 02/11/2020   NEXT APPOINTMENT DATE:@4 /29/2021  MEDICATION:allopurinol (ZYLOPRIM) 100 MG tablet/quinapril (ACCUPRIL) 20 MG tablet  [place], [place].  **Let patient know to contact pharmacy at the end of the day to make sure medication is ready. **  ** Please notify patient to allow 48-72 hours to process**  **Encourage patient to contact the pharmacy for refills or they can request refills through [facility]**  CLINICAL FILLS OUT ALL BELOW:   LAST REFILL:  QTY:  REFILL DATE:    OTHER COMMENTS:    Okay for refill?  Please advise

## 2020-02-26 ENCOUNTER — Other Ambulatory Visit: Payer: Self-pay

## 2020-02-26 ENCOUNTER — Other Ambulatory Visit (INDEPENDENT_AMBULATORY_CARE_PROVIDER_SITE_OTHER): Payer: Medicare HMO

## 2020-02-26 DIAGNOSIS — E1169 Type 2 diabetes mellitus with other specified complication: Secondary | ICD-10-CM | POA: Diagnosis not present

## 2020-02-26 DIAGNOSIS — E785 Hyperlipidemia, unspecified: Secondary | ICD-10-CM

## 2020-02-26 DIAGNOSIS — E119 Type 2 diabetes mellitus without complications: Secondary | ICD-10-CM

## 2020-02-26 DIAGNOSIS — Z0000 Encounter for general adult medical examination without abnormal findings: Secondary | ICD-10-CM

## 2020-02-26 LAB — CBC WITH DIFFERENTIAL/PLATELET
Basophils Absolute: 0 10*3/uL (ref 0.0–0.1)
Basophils Relative: 0.7 % (ref 0.0–3.0)
Eosinophils Absolute: 0.2 10*3/uL (ref 0.0–0.7)
Eosinophils Relative: 4.3 % (ref 0.0–5.0)
HCT: 44.1 % (ref 39.0–52.0)
Hemoglobin: 14.4 g/dL (ref 13.0–17.0)
Lymphocytes Relative: 41.4 % (ref 12.0–46.0)
Lymphs Abs: 2.1 10*3/uL (ref 0.7–4.0)
MCHC: 32.8 g/dL (ref 30.0–36.0)
MCV: 81.4 fl (ref 78.0–100.0)
Monocytes Absolute: 0.4 10*3/uL (ref 0.1–1.0)
Monocytes Relative: 7.8 % (ref 3.0–12.0)
Neutro Abs: 2.4 10*3/uL (ref 1.4–7.7)
Neutrophils Relative %: 45.8 % (ref 43.0–77.0)
Platelets: 91 10*3/uL — ABNORMAL LOW (ref 150.0–400.0)
RBC: 5.41 Mil/uL (ref 4.22–5.81)
RDW: 13.3 % (ref 11.5–15.5)
WBC: 5.1 10*3/uL (ref 4.0–10.5)

## 2020-02-26 LAB — COMPREHENSIVE METABOLIC PANEL
ALT: 20 U/L (ref 0–53)
AST: 19 U/L (ref 0–37)
Albumin: 4.4 g/dL (ref 3.5–5.2)
Alkaline Phosphatase: 67 U/L (ref 39–117)
BUN: 19 mg/dL (ref 6–23)
CO2: 28 mEq/L (ref 19–32)
Calcium: 9.9 mg/dL (ref 8.4–10.5)
Chloride: 104 mEq/L (ref 96–112)
Creatinine, Ser: 0.97 mg/dL (ref 0.40–1.50)
GFR: 92.7 mL/min (ref >60.00)
Glucose, Bld: 159 mg/dL — ABNORMAL HIGH (ref 70–99)
Potassium: 4.4 mEq/L (ref 3.5–5.1)
Sodium: 139 mEq/L (ref 135–145)
Total Bilirubin: 0.5 mg/dL (ref 0.2–1.2)
Total Protein: 6.8 g/dL (ref 6.0–8.3)

## 2020-02-26 LAB — LDL CHOLESTEROL, DIRECT: Direct LDL: 90 mg/dL

## 2020-02-26 LAB — HEMOGLOBIN A1C: Hgb A1c MFr Bld: 6.2 % (ref 4.6–6.5)

## 2020-02-27 ENCOUNTER — Telehealth: Payer: Self-pay

## 2020-02-27 NOTE — Telephone Encounter (Signed)
Received a letter from [facility] where they have approved colchicine (COLCRYS) 0.6 MG tablet through 10-29-2020.

## 2020-03-10 ENCOUNTER — Other Ambulatory Visit: Payer: Self-pay | Admitting: Family Medicine

## 2020-03-11 NOTE — Telephone Encounter (Signed)
Last OV 01/26/20 Last refill 07/16/19 # 100 g / 3 Next OV 07/27/20

## 2020-03-17 ENCOUNTER — Telehealth: Payer: Self-pay | Admitting: Family Medicine

## 2020-03-17 NOTE — Telephone Encounter (Signed)
Nurse Assessment Nurse: Neena Rhymes, RN, Sharyn Lull Date/Time (Eastern Time): 03/17/2020 11:50:16 AM Confirm and document reason for call. If symptomatic, describe symptoms. ---Caller states his right hand is hurting and he would like to schedule a appt. Denies injury .Marland Kitchen States hand is swollen and warm to touch. Has the patient had close contact with a person known or suspected to have the novel coronavirus illness OR traveled / lives in area with major community spread (including international travel) in the last 14 days from the onset of symptoms? * If Asymptomatic, screen for exposure and travel within the last 14 days. ---No Does the patient have any new or worsening symptoms? ---Yes Will a triage be completed? ---Yes Related visit to physician within the last 2 weeks? ---No Does the PT have any chronic conditions? (i.e. diabetes, asthma, this includes High risk factors for pregnancy, etc.) ---Yes List chronic conditions. ---HBP and DM Is this a behavioral health or substance abuse call? ---No Guidelines Guideline Title Affirmed Question Affirmed Notes Nurse Date/Time (Eastern Time) Hand and Wrist Pain [1] MODERATE pain (e.g., interferes with normal activities) AND [2] present > 3 days [name] 03/17/2020 11:52:25 AMPLEASE NOTE: All timestamps contained within this report are represented as [place] Standard Time. CONFIDENTIALTY NOTICE: This fax transmission is intended only for the addressee. It contains information that is legally privileged, confidential or otherwise protected from use or disclosure. If you are not the intended recipient, you are strictly prohibited from reviewing, disclosing, copying using or disseminating any of this information or taking any action in reliance on or regarding this information. If you have received this fax in error, please notify us immediately by telephone so that we can arrange for its return to [place]. Phone: [phone], Toll-Free:  [phone], Fax: [phone] Page: 2 of 2 Call Id: [id number] [place]. Time Eilene Ghazi Time) Disposition Final User 03/17/2020 11:23:16 AM Attempt made - message left Neena Rhymes, RNSharyn Lull 03/17/2020 11:37:08 AM Attempt made - no message left Neena Rhymes, RN, Sharyn Lull 03/17/2020 11:58:11 AM SEE PCP WITHIN 3 DAYS Yes Neena Rhymes, RN, Preston Pennington Disagree/Comply Comply Caller Understands Yes PreDisposition Call Doctor Care Advice Given Per Guideline SEE PCP WITHIN 3 DAYS: * For pain relief, you can take either acetaminophen, ibuprofen, or naproxen. * Before taking any medicine, read all the instructions on the package. * Use the lowest amount of medicine that makes your pain better. * You become worse. * Signs of infection occur (e.g., spreading redness, warmth, fever)

## 2020-03-17 NOTE — Telephone Encounter (Signed)
Patient is calling in asking for an appointment with Dr.Hunter due to having R hand pain that has not gotten any better, tried offering patient another appointment but patient would like to see Dr.Hunter, is it okay to get him in a same day?

## 2020-03-19 ENCOUNTER — Encounter: Payer: Medicare HMO | Admitting: Family Medicine

## 2020-03-24 ENCOUNTER — Ambulatory Visit: Payer: Medicare HMO | Admitting: Family Medicine

## 2020-04-05 ENCOUNTER — Telehealth: Payer: Self-pay | Admitting: Family Medicine

## 2020-04-05 ENCOUNTER — Other Ambulatory Visit: Payer: Self-pay

## 2020-04-05 ENCOUNTER — Telehealth: Payer: Self-pay | Admitting: Internal Medicine

## 2020-04-05 DIAGNOSIS — Z86010 Personal history of colonic polyps: Secondary | ICD-10-CM

## 2020-04-05 NOTE — Telephone Encounter (Signed)
Patient is calling in this morning wanting to schedule an appointment with Dr.Hunter, states that his hand and fingers are bothering him. Can we schedule for Friday at 10?

## 2020-04-05 NOTE — Telephone Encounter (Signed)
He has a difficult airway so needs to be done at hospital

## 2020-04-05 NOTE — Telephone Encounter (Signed)
Patient calling would like to schedule colonoscopy at [facility]

## 2020-04-05 NOTE — Telephone Encounter (Signed)
Dr. Carlean Purl, please review if recall needs to be done at the hospital or [place]?

## 2020-04-05 NOTE — Telephone Encounter (Signed)
Patient has been scheduled for colon, pre-visit, and COVID screen.  He is aware of all the appt details and instructions.

## 2020-04-05 NOTE — Telephone Encounter (Signed)
Yes, that is fine. 

## 2020-04-08 ENCOUNTER — Other Ambulatory Visit: Payer: Self-pay | Admitting: Family Medicine

## 2020-04-09 ENCOUNTER — Other Ambulatory Visit: Payer: Self-pay

## 2020-04-09 ENCOUNTER — Ambulatory Visit (INDEPENDENT_AMBULATORY_CARE_PROVIDER_SITE_OTHER): Payer: Medicare HMO | Admitting: Family Medicine

## 2020-04-09 ENCOUNTER — Encounter: Payer: Self-pay | Admitting: Family Medicine

## 2020-04-09 VITALS — BP 118/72 | HR 73 | Temp 98.5°F | Ht 67.0 in | Wt 162.0 lb

## 2020-04-09 DIAGNOSIS — M79641 Pain in right hand: Secondary | ICD-10-CM | POA: Diagnosis not present

## 2020-04-09 MED ORDER — MELOXICAM 7.5 MG PO TABS
7.5000 mg | ORAL_TABLET | Freq: Every day | ORAL | 0 refills | Status: DC
Start: 2020-04-09 — End: 2020-07-27

## 2020-04-09 NOTE — Progress Notes (Signed)
Phone [phone] In person visit   Subjective:   Preston Pennington is a 69 y.o. year old very pleasant male patient who presents for/with See problem oriented charting Chief Complaint  Patient presents with  . Follow-up  . right hand pain   This visit occurred during the SARS-CoV-2 public health emergency.  Safety protocols were in place, including screening questions prior to the visit, additional usage of staff PPE, and extensive cleaning of exam room while observing appropriate contact time as indicated for disinfecting solutions.   Past Medical History-  Patient Active Problem List   Diagnosis Date Noted  . Diabetes mellitus type II, controlled ([place]) 04/09/2013    Priority: High  . 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 Hyperlipidemia associated with type 2 diabetes mellitus ([place]) 07/09/2018    Priority: Medium  . Left cervical radiculopathy 09/24/2017    Priority: Medium  . History of adenomatous polyp of colon 11/06/2016    Priority: Medium  . Chronic ankle pain 06/14/2016    Priority: Medium  . Thrombocytopenia ([place]) 11/29/2015    Priority: Medium  . Gout 04/30/2015    Priority: Medium  . Hypertension associated with diabetes ([place]) 04/09/2013    Priority: Medium  . BPH (benign prostatic hyperplasia) 04/09/2013    Priority: Medium  . Leg length difference, acquired 06/20/2019    Priority: Low  . Arthritis of first metatarsophalangeal (MTP) joint of right foot 05/30/2019    Priority: Low  . Sesamoiditis 11/08/2018    Priority: Low  . Osteoarthritis of spine with radiculopathy, cervical region 11/14/2017    Priority: Low  . Right knee pain 11/21/2016    Priority: Low  . Erectile dysfunction 02/04/2015    Priority: Low    Medications- reviewed and updated Current Outpatient Medications  Medication Sig Dispense Refill  . acetaminophen (TYLENOL) 500 MG tablet Take 1,000 mg by mouth every 6 (six) hours as needed for  moderate pain.     Marland Kitchen allopurinol (ZYLOPRIM) 100 MG tablet Take 1 tablet by mouth once daily 30 tablet 0  . amLODipine (NORVASC) 10 MG tablet Take 1 tablet by mouth once daily 90 tablet 0  . ammonium lactate (LAC-HYDRIN) 12 % cream Apply to the area of callus 2-3 times a day 385 g 2  . atorvastatin (LIPITOR) 10 MG tablet Take 1 tablet by mouth once a week 13 tablet 0  . blood glucose meter kit and supplies KIT . Use up to four times daily as directed. 1 each 0  . Camphor-Eucalyptus-Menthol (VICKS VAPORUB EX) Apply 1 application topically at bedtime as needed (pain/ congestions).    . colchicine (COLCRYS) 0.6 MG tablet TAKE 2 CAPSULES BY MOUTH AT THE FIRST SIGN OF GOUT THEN TAKE 1 CAPSULE 2 HOURS LATER THEN 1 CAPSULE DAILY UNTIL FLARE IS RESOLVED 30 tablet 5  . diclofenac Sodium (VOLTAREN) 1 % GEL APPLY 2 GRAMS TOPICALLY TO AFFECTED AREAS FOUR TIMES DAILY 100 g 1  . feeding supplement, ENSURE ENLIVE, (ENSURE ENLIVE) LIQD Take 1 Bottle by mouth daily.    Marland Kitchen gabapentin (NEURONTIN) 300 MG capsule TAKE 1 CAPSULE BY MOUTH 4 TIMES DAILY OR  AS  DIRECTED 360 capsule 0  . glipiZIDE (GLUCOTROL) 5 MG tablet Take 1 tablet (5 mg total) by mouth 2 (two) times daily before a meal. 180 tablet 2  . glucose blood (ACCU-CHEK SMARTVIEW) test strip Use to test blood sugar daily and prn 100 each 4  . metFORMIN (GLUCOPHAGE) 1000  MG tablet TAKE 1 TABLET BY MOUTH TWICE DAILY WITH MEALS 180 tablet 0  . MITIGARE 0.6 MG CAPS TAKE 2 CAPSULES BY MOUTH AT THE FIRST SIGN OF GOUT THEN TAKE 1 CAPSULE 2 HOURS LATER THEN 1 CAPSULE DAILY UNTIL FLARE IS RESOLVED 30 capsule 5  . quinapril (ACCUPRIL) 20 MG tablet TAKE 1 TABLET BY MOUTH AT BEDTIME 90 tablet 0  . Tetrahyd-Glyc-Hypro-PEG-ZnSulf (VISINE TOTALITY MULTI-SYMPTOM OP) Apply 1 drop to eye daily as needed (dry eyes).    . meloxicam (MOBIC) 7.5 MG tablet Take 1 tablet (7.5 mg total) by mouth daily. 30 tablet 0   No current facility-administered medications for this visit.       Objective:  BP 118/72   Pulse 73   Temp 98.5 F (36.9 C)   Ht '5\' 7"'  (1.702 m)   Wt 162 lb (73.5 kg)   SpO2 96%   BMI 25.37 kg/m  Gen: NAD, resting comfortably CV: RRR no murmurs rubs or gallops Lungs: CTAB no crackles, wheeze, rhonchi MSK: Patient with pain proximal to right fourth MCP joint-seems to extend along flexor tendon-worse with flexion of the hand.  No triggering mechanism and he denies any history of this.  No erythema or warmth noted.  No pain on the opposite side of the hand along fourth carpal bone.     Assessment and Plan  Right Hand Pain S: pt c/o right hand pain for a week-pain is proximal to MCP on right fourth finger.  He feels a slightly raised area here.  Denies recent trauma or injury-specifically no splinters.  Pain can be rather severe up to 8 out of 10.  Tylenol does at least take the edge off of this pain.  Has not tried anything else for pain.  Patient does have a history of gout but no pain over an obvious joint or bursa.  No fever, chills, redness over the hand-pain does extend several centimeters seemingly along the flexor tendon A/P: 69 year old male with rather intense pain over nodular area that appears to be just proximal to A1 pulley and right fourth MCP joint-pain does seem to extend down the flexor tendon.  No pain on the metacarpal bone on opposite side of the hand-strongly doubt this is a bony abnormality I do not think x-ray would be beneficial.  I think ultrasound will be more beneficial and we opted to refer to sports medicine.  We will start with a course of meloxicam 7.5 mg-he will watch out for any blood pressure elevations or GI issues.  Hoping to get him into sports medicine next week-if not I be okay with changing referral to orthopedics  #Diabetes-patient shows me home sugar numbers which look pretty reasonable lately-did have 1 day of outliers above 180 but states did not eat the healthiest that day.  Last A1c was well controlled and due  soon for repeat so we will hold off at this time  Recommended follow up: Keep September visit or see [place] sooner if needed Future Appointments  Date Time Provider [place]  05/13/2020  1:30 PM [facility] PREVISIT RM50 [facility]  05/14/2020 11:00 AM [facility] LAB [facility] None  07/27/2020  8:40 AM , Brayton Mars, MD [facility]    Lab/Order associations:   ICD-10-CM   1. Right hand pain  M79.641 Ambulatory referral to Sports Medicine    Meds ordered this encounter  Medications  . meloxicam (MOBIC) 7.5 MG tablet    Sig: Take 1 tablet (7.5 mg total) by mouth daily.  Dispense:  30 tablet    Refill:  0    Time Spent: 24 minutes of total time (10:03 AM- 10:27 AM) was spent on the date of the encounter performing the following actions: chart review prior to seeing the patient, obtaining history, performing a medically necessary exam, counseling on the treatment plan, placing orders, and documenting in our EHR.   Return precautions advised.  Garret Reddish, MD

## 2020-04-09 NOTE — Patient Instructions (Addendum)
We will call you within one week about your referral to Sports Medicine. If you do not hear within 1 weeks, give [place] a call. Hoping to get you in next week  It seems like the tendon on your right hand 4th finger is inflammed- im not sure why that is. We are going to try an anti-inflammatory to calm this down.  Make sure to take this with food  Try Meloxicam for inflammation, ok to take Tylenol with this.  If you have new or worsening symptoms-particularly fever or worsening pain please let us know immediately.

## 2020-04-14 ENCOUNTER — Other Ambulatory Visit: Payer: Self-pay | Admitting: Family Medicine

## 2020-04-15 ENCOUNTER — Encounter: Payer: Self-pay | Admitting: Family Medicine

## 2020-04-15 ENCOUNTER — Ambulatory Visit: Payer: Self-pay

## 2020-04-15 ENCOUNTER — Other Ambulatory Visit: Payer: Self-pay

## 2020-04-15 ENCOUNTER — Ambulatory Visit (INDEPENDENT_AMBULATORY_CARE_PROVIDER_SITE_OTHER): Payer: Medicare HMO | Admitting: Family Medicine

## 2020-04-15 VITALS — BP 140/80 | HR 96 | Ht 67.0 in | Wt 164.0 lb

## 2020-04-15 DIAGNOSIS — M79641 Pain in right hand: Secondary | ICD-10-CM | POA: Diagnosis not present

## 2020-04-15 NOTE — Patient Instructions (Addendum)
Thank you for coming in today.  You had an injection today. Call or go to the ER if you develop a large red swollen joint with extreme pain or oozing puss.   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  Recheck with me in 4 weeks.  Return sooner if needed.    Dupuytren's Contracture Dupuytren's contracture is a condition in which tissue under the skin of the palm becomes thick. This causes one or more of the fingers to curl inward (contract) toward the palm. After a while, the fingers may not be able to straighten out. This condition affects some or all of the fingers and the palm of the hand. This condition may affect one or both hands. Dupuytren's contracture is a long-term (chronic) condition that develops (progresses) slowly over time. There is no cure, but symptoms can be managed and progression can be slowed with treatment. This condition is usually not dangerous or painful, but it can interfere with everyday tasks. What are the causes?  This condition is caused by tissue (fascia) in the palm that gets thicker and tighter. When the fascia thickens, it pulls on the cords of tissue (tendons) that control finger movement. This causes the fingers to contract. The cause of fascia thickening is not known. However, the condition is often passed along from parent to child (inherited). What increases the risk? The following factors may make you more likely to develop this condition:  Being 79 years of age or older.  Being male.  Having a family history of this condition.  Using tobacco products, including cigarettes, chewing tobacco, and e-cigarettes.  Drinking alcohol excessively.  Having diabetes.  Having a seizure disorder. What are the signs or symptoms? Early symptoms of this condition may include:  Thick, puckered skin on the hand.  One or more lumps (nodules) on the palm. Nodules may be tender when they first appear, but they  are generally painless. Later symptoms of this condition may include:  Thick cords of tissue in the palm.  Fingers curled up toward the palm.  Inability to straighten the fingers into their normal position. Though this condition is usually painless, you may have discomfort when holding or grabbing objects. How is this diagnosed? This condition is diagnosed with a physical exam, which may include:  Looking at your hands and feeling your palms. This is to check for thickened fascia and nodules.  Measuring finger motion.  Doing the [place] tabletop test. You may be asked to try to put your hand on a surface, with your palm down and your fingers straight out. How is this treated? There is no cure for this condition, but treatment can relieve discomfort and make symptoms more manageable. Treatment options may include:  Physical therapy. This can strengthen your hand and increase flexibility.  Occupational therapy. This can help you with everyday tasks that may be more difficult because of your condition.  Shots (injections). Substances may be injected into your hand, such as: ? Medicines that help to decrease swelling (corticosteroids). ? Proteins (collagenase) to weaken thick tissue. After a collagenase injection, your health care provider may stretch your fingers.  Needle aponeurotomy. A needle is pushed through the skin and into the fascia. Moving the needle against the fascia can weaken or break up the thick tissue.  Surgery. This may be needed if your condition causes discomfort or interferes with everyday activities. Physical therapy is usually needed after surgery. No treatment  is guaranteed to cure this condition. Recurrence of symptoms is common. Follow these instructions at home: Hand care  Take these actions to help protect your hand from possible injury: ? Use tools that have padded grips. ? Wear protective gloves while you work with your hands. ? Avoid repetitive hand  movements. General instructions  Take over-the-counter and prescription medicines only as told by your health care provider.  Manage any other conditions that you have, such as diabetes.  If physical therapy was prescribed, do exercises as told by your health care provider.  Do not use any products that contain nicotine or tobacco, such as cigarettes, e-cigarettes, and chewing tobacco. If you need help quitting, ask your health care provider.  If you drink alcohol: ? Limit how much you use to:  0-1 drink a day for women.  0-2 drinks a day for men. ? Be aware of how much alcohol is in your drink. In the [place] one drink equals one 12 oz bottle of beer (355 mL), one 5 oz glass of wine (148 mL), or one 1 oz glass of hard liquor (44 mL).  Keep all follow-up visits as told by your health care provider. This is important. Contact a health care provider if:  You develop new symptoms, or your symptoms get worse.  You have pain that gets worse or does not get better with medicine.  You have difficulty or discomfort with everyday tasks.  You develop numbness or tingling. Get help right away if:  You have severe pain.  Your fingers change color or become unusually cold. Summary  Dupuytren's contracture is a condition in which tissue under the skin of the palm becomes thick.  This condition is caused by tissue (fascia) that thickens. When it thickens, it pulls on the cords of tissue (tendons) that control finger movement and makes the fingers to contract.  You are more likely to develop this condition if you are a man, are over 15 years of age, have a family history of the condition, and drink a lot of alcohol.  This condition can be treated with physical and occupational therapy, injections, and surgery.  Follow instructions about how to care for your hand. Get help right away if you have severe pain or your fingers change color or become cold. This information is not intended to  replace advice given to you by your health care provider. Make sure you discuss any questions you have with your health care provider. Document Revised: 05/07/2018 Document Reviewed: 05/07/2018 Elsevier Patient Education  [place].    Ganglion Cyst  A ganglion cyst is a non-cancerous, fluid-filled lump that occurs near a joint or tendon. The cyst grows out of a joint or the lining of a tendon. Ganglion cysts most often develop in the hand or wrist, but they can also develop in the shoulder, elbow, hip, knee, ankle, or foot. Ganglion cysts are ball-shaped or egg-shaped. Their size can range from the size of a pea to larger than a grape. Increased activity may cause the cyst to get bigger because more fluid starts to build up. What are the causes? The exact cause of this condition is not known, but it may be related to:  Inflammation or irritation around the joint.  An injury.  Repetitive movements or overuse.  Arthritis. What increases the risk? You are more likely to develop this condition if:  You are a woman.  You are 62-14 years old. What are the signs or symptoms? The main  symptom of this condition is a lump. It most often appears on the hand or wrist. In many cases, there are no other symptoms, but a cyst can sometimes cause:  Tingling.  Pain.  Numbness.  Muscle weakness.  Weak grip.  Less range of motion in a joint. How is this diagnosed? Ganglion cysts are usually diagnosed based on a physical exam. Your health care provider will feel the lump and may shine a light next to it. If it is a ganglion cyst, the light will likely shine through it. Your health care provider may order an X-ray, ultrasound, or MRI to rule out other conditions. How is this treated? Ganglion cysts often go away on their own without treatment. If you have pain or other symptoms, treatment may be needed. Treatment is also needed if the ganglion cyst limits your movement or if it gets  infected. Treatment may include:  Wearing a brace or splint on your wrist or finger.  Taking anti-inflammatory medicine.  Having fluid drained from the lump with a needle (aspiration).  Getting a steroid injected into the joint.  Having surgery to remove the ganglion cyst.  Placing a pad on your shoe or wearing shoes that will not rub against the cyst if it is on your foot. Follow these instructions at home:  Do not press on the ganglion cyst, poke it with a needle, or hit it.  Take over-the-counter and prescription medicines only as told by your health care provider.  If you have a brace or splint: ? Wear it as told by your health care provider. ? Remove it as told by your health care provider. Ask if you need to remove it when you take a shower or a bath.  Watch your ganglion cyst for any changes.  Keep all follow-up visits as told by your health care provider. This is important. Contact a health care provider if:  Your ganglion cyst becomes larger or more painful.  You have pus coming from the lump.  You have weakness or numbness in the affected area.  You have a fever or chills. Get help right away if:  You have a fever and have any of these in the cyst area: ? Increased redness. ? Red streaks. ? Swelling. Summary  A ganglion cyst is a non-cancerous, fluid-filled lump that occurs near a joint or tendon.  Ganglion cysts most often develop in the hand or wrist, but they can also develop in the shoulder, elbow, hip, knee, ankle, or foot.  Ganglion cysts often go away on their own without treatment. This information is not intended to replace advice given to you by your health care provider. Make sure you discuss any questions you have with your health care provider. Document Revised: 09/28/2017 Document Reviewed: 06/15/2017 Elsevier Patient Education  [place].

## 2020-04-15 NOTE — Progress Notes (Signed)
    Subjective:    CC: R 4th finger pain  I, Judy Pimple, am serving as a Education administrator for Dr. Lynne Leader.  HPI: Pt is a 69 y/o male presenting w/ c/o R 4th metacarpal palmar pain x approximately 2 weeks w/ no known MOI.  He locates his pain to middle of right hand.  He rates his pain at an 8/10 at it's worst.  Pt does have a hx of gout. States pain has been going on for a few months and has trouble gripping things.   Radiating pain: R 4th finger swelling: R 4th finger mechanical symptoms: Aggravating factors: Treatments tried: Meloxicam 7.5mg ;  Pertinent review of Systems: No fevers or chills  Relevant historical information: Hypertension and diabetes   Objective:    Vitals:   04/15/20 0959  BP: 140/80  Pulse: 96  SpO2: 98%   General: Well Developed, well nourished, and in no acute distress.   MSK: Right hand palmar nodule overlying distal fourth metacarpal.  Normal finger motion and strength.  No triggering.  Palmar nodule mildly tender to palpation.  Lab and Radiology Results Diagnostic Limited MSK Ultrasound of: Right palm fourth metacarpal Tissue nodule visualized overlying fourth metacarpal palmar aspect. Mixed density with areas of hyper and hypoechoic change.  Somewhat consistent appearance with ganglion cyst.  Superficial to flexor tendon sheath.  Flexor tendon sheath was freely without triggering. Impression: Ganglion cyst versus early Dupuytren's contracture  Aspiration and injection of right palm ganglion cyst. Consent obtained and timeout performed. Area cleaned with isopropyl alcohol and cold spray applied. 1.5 mL of lidocaine injected achieving good anesthesia. Skin was again sterilized with isopropyl alcohol. 18-gauge needle was used to access the cystic structure. No fluid was able to be expressed. Skin was again sterilized and 20 mg of Depo-Medrol and 0.3 mL of lidocaine were injected into and around the nodule/cystic structure. Dressing was  applied.  Patient tolerated procedure well.  Impression and Recommendations:    Assessment and Plan: 69 y.o. male with right hand nodule.  Unclear etiology at this time.  Ultrasound has appearance more consistent with ganglion cyst however the physical exam appearance is more consistent with Dupuytren's contracture.  I was unable to express or aspirate much contents of the cystic structure indicating this is probably more of a Dupuytren's.  Regardless proceed with injection into or around the cystic structure.  Plan to check back in 4 weeks.  Would consider further evaluation and treatment for Dupuytren's if not improving..   Orders Placed This Encounter  Procedures  . [place] LIMITED JOINT SPACE STRUCTURES UP RIGHT(NO LINKED CHARGES)    Order Specific Question:   Reason for Exam (SYMPTOM  OR DIAGNOSIS REQUIRED)    Answer:   hand pain    Order Specific Question:   Preferred imaging location?    Answer:   [place]   No orders of the defined types were placed in this encounter.   Discussed warning signs or symptoms. Please see discharge instructions. Patient expresses understanding.   The above documentation has been reviewed and is accurate and complete Lynne Leader, M.D.

## 2020-04-28 DIAGNOSIS — M25761 Osteophyte, right knee: Secondary | ICD-10-CM | POA: Diagnosis not present

## 2020-04-28 DIAGNOSIS — M170 Bilateral primary osteoarthritis of knee: Secondary | ICD-10-CM | POA: Diagnosis not present

## 2020-04-28 DIAGNOSIS — M1711 Unilateral primary osteoarthritis, right knee: Secondary | ICD-10-CM | POA: Diagnosis not present

## 2020-04-28 DIAGNOSIS — M25561 Pain in right knee: Secondary | ICD-10-CM | POA: Diagnosis not present

## 2020-04-28 DIAGNOSIS — M222X1 Patellofemoral disorders, right knee: Secondary | ICD-10-CM | POA: Diagnosis not present

## 2020-05-08 ENCOUNTER — Other Ambulatory Visit: Payer: Self-pay | Admitting: Family Medicine

## 2020-05-13 ENCOUNTER — Ambulatory Visit (AMBULATORY_SURGERY_CENTER): Payer: Self-pay | Admitting: *Deleted

## 2020-05-13 ENCOUNTER — Other Ambulatory Visit: Payer: Self-pay

## 2020-05-13 VITALS — Ht 67.0 in | Wt 160.0 lb

## 2020-05-13 DIAGNOSIS — Z86010 Personal history of colonic polyps: Secondary | ICD-10-CM

## 2020-05-13 DIAGNOSIS — Z01818 Encounter for other preprocedural examination: Secondary | ICD-10-CM

## 2020-05-13 DIAGNOSIS — H401131 Primary open-angle glaucoma, bilateral, mild stage: Secondary | ICD-10-CM | POA: Diagnosis not present

## 2020-05-13 MED ORDER — SUPREP BOWEL PREP KIT 17.5-3.13-1.6 GM/177ML PO SOLN: 1.0000 | Freq: Once | ORAL | 0 refills | Status: AC

## 2020-05-13 NOTE — Progress Notes (Signed)
No egg or soy allergy known to patient  No issues with past sedation with any surgeries or procedures Difficult intubation problems in the past - pt has small mouth- [facility] cas e due to this issue  No diet pills per patient No home 02 use per patient  No blood thinners per patient  Pt denies issues with constipation  No A fib or A flutter  EMMI video to pt or MyChart  COVID 19 guidelines implemented in [facility] today    Due to the COVID-19 pandemic we are asking patients to follow these guidelines. Please only bring one care partner. Please be aware that your care partner may wait in the car in the parking lot or if they feel like they will be too hot to wait in the car, they may wait in the lobby on the 4th floor. All care partners are required to wear a mask the entire time (we do not have any that we can provide them), they need to practice social distancing, and we will do a Covid check for all patient's and care partners when you arrive. Also we will check their temperature and your temperature. If the care partner waits in their car they need to stay in the parking lot the entire time and we will call them on their cell phone when the patient is ready for discharge so they can bring the car to the front of the building. Also all patient's will need to wear a mask into building.

## 2020-05-14 ENCOUNTER — Other Ambulatory Visit (HOSPITAL_COMMUNITY): Payer: Medicare HMO

## 2020-05-14 ENCOUNTER — Other Ambulatory Visit (HOSPITAL_COMMUNITY)
Admission: RE | Admit: 2020-05-14 | Discharge: 2020-05-14 | Disposition: A | Discharge status: Home / Self Care | Payer: Medicare HMO | Source: Ambulatory Visit | Attending: Internal Medicine | Admitting: Internal Medicine

## 2020-05-14 DIAGNOSIS — Z01812 Encounter for preprocedural laboratory examination: Secondary | ICD-10-CM | POA: Diagnosis not present

## 2020-05-14 DIAGNOSIS — Z20822 Contact with and (suspected) exposure to covid-19: Secondary | ICD-10-CM | POA: Diagnosis not present

## 2020-05-15 LAB — SARS CORONAVIRUS 2 (TAT 6-24 HRS): SARS Coronavirus 2: NEGATIVE

## 2020-05-16 ENCOUNTER — Other Ambulatory Visit: Payer: Self-pay | Admitting: Family Medicine

## 2020-05-17 ENCOUNTER — Ambulatory Visit: Payer: Medicare HMO | Admitting: Family Medicine

## 2020-05-17 NOTE — Progress Notes (Signed)
Pre call done for endo procedure tomorrow 05/18/20. Patient stated he has been quarantined since covid testing. Patient to arrive by 0945, has clear instructions about colon prep, and confirmed has a ride home. All questions addressed.

## 2020-05-18 ENCOUNTER — Other Ambulatory Visit: Payer: Self-pay

## 2020-05-18 ENCOUNTER — Ambulatory Visit (HOSPITAL_COMMUNITY): Payer: Medicare HMO | Admitting: Anesthesiology

## 2020-05-18 ENCOUNTER — Encounter: Payer: Self-pay | Admitting: Internal Medicine

## 2020-05-18 ENCOUNTER — Ambulatory Visit (HOSPITAL_COMMUNITY)
Admission: RE | Admit: 2020-05-18 | Discharge: 2020-05-18 | Disposition: A | Discharge status: Home / Self Care | Payer: Medicare HMO | Attending: Internal Medicine | Admitting: Internal Medicine

## 2020-05-18 ENCOUNTER — Encounter (HOSPITAL_COMMUNITY)
Admission: RE | Disposition: A | Discharge status: Home / Self Care | Payer: Self-pay | Source: Home / Self Care | Attending: Internal Medicine

## 2020-05-18 DIAGNOSIS — K5730 Diverticulosis of large intestine without perforation or abscess without bleeding: Secondary | ICD-10-CM | POA: Diagnosis not present

## 2020-05-18 DIAGNOSIS — E785 Hyperlipidemia, unspecified: Secondary | ICD-10-CM | POA: Insufficient documentation

## 2020-05-18 DIAGNOSIS — N400 Enlarged prostate without lower urinary tract symptoms: Secondary | ICD-10-CM | POA: Diagnosis not present

## 2020-05-18 DIAGNOSIS — Z9079 Acquired absence of other genital organ(s): Secondary | ICD-10-CM | POA: Diagnosis not present

## 2020-05-18 DIAGNOSIS — Z860100 Personal history of colon polyps, unspecified: Secondary | ICD-10-CM

## 2020-05-18 DIAGNOSIS — Z791 Long term (current) use of non-steroidal anti-inflammatories (NSAID): Secondary | ICD-10-CM | POA: Diagnosis not present

## 2020-05-18 DIAGNOSIS — Z79899 Other long term (current) drug therapy: Secondary | ICD-10-CM | POA: Insufficient documentation

## 2020-05-18 DIAGNOSIS — H42 Glaucoma in diseases classified elsewhere: Secondary | ICD-10-CM | POA: Diagnosis not present

## 2020-05-18 DIAGNOSIS — I10 Essential (primary) hypertension: Secondary | ICD-10-CM | POA: Insufficient documentation

## 2020-05-18 DIAGNOSIS — Z1211 Encounter for screening for malignant neoplasm of colon: Secondary | ICD-10-CM | POA: Diagnosis not present

## 2020-05-18 DIAGNOSIS — Z7984 Long term (current) use of oral hypoglycemic drugs: Secondary | ICD-10-CM | POA: Diagnosis not present

## 2020-05-18 DIAGNOSIS — E1139 Type 2 diabetes mellitus with other diabetic ophthalmic complication: Secondary | ICD-10-CM | POA: Diagnosis not present

## 2020-05-18 DIAGNOSIS — E119 Type 2 diabetes mellitus without complications: Secondary | ICD-10-CM | POA: Diagnosis not present

## 2020-05-18 HISTORY — PX: COLONOSCOPY WITH PROPOFOL: SHX5780

## 2020-05-18 LAB — GLUCOSE, CAPILLARY: Glucose-Capillary: 119 mg/dL — ABNORMAL HIGH (ref 70–99)

## 2020-05-18 SURGERY — COLONOSCOPY WITH PROPOFOL
Anesthesia: Monitor Anesthesia Care

## 2020-05-18 MED ORDER — SODIUM CHLORIDE 0.9 % IV SOLN: INTRAVENOUS | Status: DC

## 2020-05-18 MED ORDER — LACTATED RINGERS IV SOLN: INTRAVENOUS | Status: DC

## 2020-05-18 MED ORDER — LIDOCAINE 2% (20 MG/ML) 5 ML SYRINGE
INTRAMUSCULAR | Status: DC | PRN
  Administered 2020-05-18: 100 mg via INTRAVENOUS

## 2020-05-18 MED ORDER — PROPOFOL 500 MG/50ML IV EMUL
INTRAVENOUS | Status: DC | PRN
  Administered 2020-05-18: 100 ug/kg/min via INTRAVENOUS

## 2020-05-18 MED ORDER — PROPOFOL 10 MG/ML IV BOLUS
INTRAVENOUS | Status: DC | PRN
  Administered 2020-05-18 (×2): 20 mg via INTRAVENOUS

## 2020-05-18 MED ORDER — PROPOFOL 500 MG/50ML IV EMUL
INTRAVENOUS | Status: AC
  Filled 2020-05-18: qty 50

## 2020-05-18 MED ORDER — LACTATED RINGERS IV SOLN
INTRAVENOUS | Status: DC
  Administered 2020-05-18: 1000 mL via INTRAVENOUS

## 2020-05-18 SURGICAL SUPPLY — 21 items

## 2020-05-18 NOTE — Anesthesia Procedure Notes (Signed)
Date/Time: 05/18/2020 10:40 AM Performed by: Sharlette Dense, CRNA Oxygen Delivery Method: Simple face mask

## 2020-05-18 NOTE — H&P (Signed)
[facility] History and Physical   Primary Care Physician:  Marin Olp, MD   Reason for Procedure:   Hx colon polyps - surveillance  Plan:    Colonoscopy - The risks and benefits as well as alternatives of endoscopic procedure(s) have been discussed and reviewed. All questions answered. The patient agrees to proceed.   HPI: Preston Pennington is a 69 y.o. male here for surveillance colonoscopy.   Past Medical History:  Diagnosis Date  . 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 Diabetes mellitus without complication ([place])   . Difficult intubation    "mouth was not wide enough"  . Dry eyes   . History of adenomatous polyp of colon 11/06/2016   10/2016 adenoma x3 - recall colon 10/2019  . Hyperlipidemia   . Hypertension   . Neuromuscular disorder ([place])    past hx tingling left arm   . Prostate enlargement 2010   See's Urologist     Past Surgical History:  Procedure Laterality Date  . COLONOSCOPY    . COLONOSCOPY WITH PROPOFOL N/A 11/02/2016   Procedure: COLONOSCOPY WITH PROPOFOL;  Surgeon: Gatha Mayer, MD;  Location: [facility] ENDOSCOPY;  Service: Endoscopy;  Laterality: N/A;  . LEG SURGERY Right 1997   accident related  . XI ROBOTIC ASSISTED SIMPLE PROSTATECTOMY N/A 10/05/2017   Procedure: XI ROBOTIC ASSISTED SIMPLE PROSTATECTOMY WITH UMBILICAL HERNIA REPAIR;  Surgeon: Cleon Gustin, MD;  Location: [facility] ORS;  Service: Urology;  Laterality: N/A;    Prior to Admission medications   Medication Sig Start Date End Date Taking? Authorizing Provider  Accu-Chek Softclix Lancets lancets 4 (four) times daily. 02/01/20  Yes [provider]  acetaminophen (TYLENOL) 500 MG tablet Take 1,000 mg by mouth every 6 (six) hours as needed for moderate pain.    Yes [provider]  allopurinol (ZYLOPRIM) 100 MG tablet Take 1 tablet by mouth once daily 05/17/20  Yes Marin Olp, MD  amLODipine (NORVASC) 10 MG tablet Take 1 tablet by mouth  once daily Patient taking differently: Take 10 mg by mouth daily.  04/15/20  Yes Marin Olp, MD  atorvastatin (LIPITOR) 10 MG tablet Take 1 tablet by mouth once a week Patient taking differently: Take 10 mg by mouth every Saturday. In the morning. 04/08/20  Yes Marin Olp, MD  Camphor-Eucalyptus-Menthol (VICKS VAPORUB EX) Apply 1 application topically at bedtime as needed (pain/ congestions).   Yes [provider]  colchicine (COLCRYS) 0.6 MG tablet TAKE 2 CAPSULES BY MOUTH AT THE FIRST SIGN OF GOUT THEN TAKE 1 CAPSULE 2 HOURS LATER THEN 1 CAPSULE DAILY UNTIL FLARE IS RESOLVED Patient taking differently: Take 0.6-1.2 mg by mouth 2 (two) times daily as needed (gout flares.). TAKE 2 CAPSULES BY MOUTH AT THE FIRST SIGN OF GOUT THEN TAKE 1 CAPSULE 2 HOURS LATER THEN 1 CAPSULE DAILY UNTIL FLARE IS RESOLVED 11/20/19  Yes Marin Olp, MD  diclofenac Sodium (VOLTAREN) 1 % GEL APPLY 2 GRAMS TOPICALLY TO AFFECTED AREAS FOUR TIMES DAILY Patient taking differently: Apply 2 g topically 4 (four) times daily.  03/11/20  Yes Marin Olp, MD  feeding supplement, ENSURE ENLIVE, (ENSURE ENLIVE) LIQD Take 1 Bottle by mouth daily.   Yes [provider]  gabapentin (NEURONTIN) 300 MG capsule Take 1 capsule (300 mg total) by mouth in the morning and at bedtime. 05/10/20  Yes Marin Olp, MD  glipiZIDE (GLUCOTROL) 5 MG tablet Take 1 tablet (5 mg total) by  mouth 2 (two) times daily before a meal. 11/21/19  Yes Marin Olp, MD  glucose blood (ACCU-CHEK SMARTVIEW) test strip Use to test blood sugar daily and prn 06/23/19  Yes Marin Olp, MD  latanoprost (XALATAN) 0.005 % ophthalmic solution Place 1 drop into both eyes at bedtime.  04/15/20  Yes [provider]  meloxicam (MOBIC) 7.5 MG tablet Take 1 tablet (7.5 mg total) by mouth daily. 04/09/20  Yes Marin Olp, MD  metFORMIN (GLUCOPHAGE) 1000 MG tablet TAKE 1 TABLET BY MOUTH TWICE DAILY WITH MEALS Patient  taking differently: Take 1,000 mg by mouth in the morning and at bedtime.  02/09/20  Yes Marin Olp, MD  Polyethyl Glycol-Propyl Glycol (SYSTANE) 0.4-0.3 % SOLN Apply to eye. For dry eyes 3 x a day   Yes [provider]  quinapril (ACCUPRIL) 20 MG tablet TAKE 1 TABLET BY MOUTH AT BEDTIME Patient taking differently: Take 10 mg by mouth at bedtime.  02/24/20  Yes Marin Olp, MD  Tetrahyd-Glyc-Hypro-PEG-ZnSulf (VISINE TOTALITY MULTI-SYMPTOM OP) Apply 1 drop to eye in the morning, at noon, and at bedtime.    Yes [provider]  ammonium lactate (LAC-HYDRIN) 12 % cream Apply to the area of callus 2-3 times a day Patient not taking: Reported on 05/07/2020 07/18/19   Dickie La, MD  blood glucose meter kit and supplies KIT . Use up to four times daily as directed. 01/26/20   Marin Olp, MD    Current Facility-Administered Medications  Medication Dose Route Frequency Provider Last Rate Last Admin  . lactated ringers infusion   Intravenous Continuous Gatha Mayer, MD 10 mL/hr at 05/18/20 1023 1,000 mL at 05/18/20 1023    Allergies as of 04/05/2020  . (No Known Allergies)    Family History  Problem Relation Age of Onset  . Other Father        and mother- states died of old age. States no medical problems in parents or siblings  . Colon cancer Neg Hx   . Colon polyps Neg Hx   . Esophageal cancer Neg Hx   . Rectal cancer Neg Hx   . Stomach cancer Neg Hx     Social History   Social History Narrative   Married. 4 children. 5 grandkids- new grandchild 10/2015 included.    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       Hobbies: enjoys reading novels. Enjoys African authors   Goes to [employer] [place]     Review of Systems:  All other review of systems negative except as mentioned in the HPI.  Physical Exam: Vital signs in last 24 hours: Temp:   [98 F (36.7 C)] 98 F (36.7 C) (07/20 1001) Resp:  [12] 12 (07/20 1001) BP: (126)/(81) 126/81 (07/20 1001) SpO2:  [99 %] 99 % (07/20 1001) Weight:  [72.6 kg-73 kg] 72.6 kg (07/20 1001)   General:   Alert,  Well-developed, well-nourished, pleasant and cooperative in NAD Lungs:  Clear throughout to auscultation.   Heart:  Regular rate and rhythm; no murmurs, clicks, rubs,  or gallops. Abdomen:  Soft, nontender and nondistended. Normal bowel sounds.   Neuro/Psych:  Alert and cooperative. Normal mood and affect. A and O x 3   _0  E. Carlean Purl, MD, [facility] [phone] (pager) 05/18/2020 10:38 AM@

## 2020-05-18 NOTE — Discharge Instructions (Signed)
No polyps seen today!  Your next routine colonoscopy should be in 5 years - 2026.  I appreciate the opportunity to care for you. Gatha Mayer, MD, FACG   YOU HAD AN ENDOSCOPIC PROCEDURE TODAY: Refer to the procedure report and other information in the discharge instructions given to you for any specific questions about what was found during the examination. If this information does not answer your questions, please call Dr. Celesta Aver office at [phone] to clarify.   YOU SHOULD EXPECT: Some feelings of bloating in the abdomen. Passage of more gas than usual. Walking can help get rid of the air that was put into your GI tract during the procedure and reduce the bloating. If you had a lower endoscopy (such as a colonoscopy or flexible sigmoidoscopy) you may notice spotting of blood in your stool or on the toilet paper. Some abdominal soreness may be present for a day or two, also.  DIET: Your first meal following the procedure should be a light meal and then it is ok to progress to your normal diet. A half-sandwich or bowl of soup is an example of a good first meal. Heavy or fried foods are harder to digest and may make you feel nauseous or bloated. Drink plenty of fluids but you should avoid alcoholic beverages for 24 hours.   ACTIVITY: Your care partner should take you home directly after the procedure. You should plan to take it easy, moving slowly for the rest of the day. You can resume normal activity the day after the procedure however YOU SHOULD NOT DRIVE, use power tools, machinery or perform tasks that involve climbing or major physical exertion for 24 hours (because of the sedation medicines used during the test).   SYMPTOMS TO REPORT IMMEDIATELY: A gastroenterologist can be reached at any hour. Please call [phone]  for any of the following symptoms:  Following lower endoscopy (colonoscopy, flexible sigmoidoscopy) Excessive amounts of blood in the stool  Significant tenderness,  worsening of abdominal pains  Swelling of the abdomen that is new, acute  Fever of 100 or higher

## 2020-05-18 NOTE — Op Note (Signed)
[facility] Patient Name: Preston Pennington Procedure Date: 05/18/2020 MRN: [id number] Attending MD: Gatha Mayer , MD Date of Birth: [birth year] CSN: [id number] Age: 69 Admit Type: Outpatient Procedure:                Colonoscopy Indications:              Surveillance: Personal history of adenomatous                            polyps on last colonoscopy 3 years ago Providers:                Gatha Mayer, MD, Cleda Daub, RN, Laverda Sorenson, Technician, Tyrone Apple, Technician,                            Danley Danker, CRNA Referring MD:              Medicines:                Propofol per Anesthesia, Monitored Anesthesia Care Complications:            No immediate complications. Estimated Blood Loss:     Estimated blood loss: none. Procedure:                Pre-Anesthesia Assessment:                           - Prior to the procedure, a History and Physical                            was performed, and patient medications and                            allergies were reviewed. The patient's tolerance of                            previous anesthesia was also reviewed. The risks                            and benefits of the procedure and the sedation                            options and risks were discussed with the patient.                            All questions were answered, and informed consent                            was obtained. Prior Anticoagulants: The patient has                            taken no previous anticoagulant or antiplatelet  agents. ASA Grade Assessment: II - A patient with                            mild systemic disease. After reviewing the risks                            and benefits, the patient was deemed in                            satisfactory condition to undergo the procedure.                           After obtaining informed consent, the colonoscope                             was passed under direct vision. Throughout the                            procedure, the patient's blood pressure, pulse, and                            oxygen saturations were monitored continuously. The                            [id number]-HQ190L ([id number]) Olympus colonoscope was                            introduced through the anus and advanced to the the                            cecum, identified by appendiceal orifice and                            ileocecal valve. The colonoscopy was performed                            without difficulty. The patient tolerated the                            procedure well. The quality of the bowel                            preparation was excellent. The appendiceal orifice                            and the rectum were photographed. Scope In: 10:46:39 AM Scope Out: 10:55:31 AM Scope Withdrawal Time: 0 hours 7 minutes 1 second  Total Procedure Duration: 0 hours 8 minutes 52 seconds  Findings:      The digital rectal exam findings include surgically absent prostate.      A few diverticula were found in the sigmoid colon.      The exam was otherwise without abnormality on direct and retroflexion       views. 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                           - Mild diverticulosis in the sigmoid colon.                           - The examination was otherwise normal on direct                            and retroflexion views.                           - No specimens collected.                           - Personal history of colonic polyps. 3 adenomas                            2018 Moderate Sedation:      Not Applicable - Patient had care per Anesthesia. Recommendation:           - Patient has a contact number available for                            emergencies. The signs and symptoms of potential                            delayed complications were  discussed with the                            patient. Return to normal activities tomorrow.                            Written discharge instructions were provided to the                            patient.                           - Resume previous diet.                           - Continue present medications.                           - Repeat colonoscopy in 5 years for surveillance. Procedure Code(s):        --- Professional ---                           W8088, Colorectal cancer screening; colonoscopy on                            individual at high risk Diagnosis Code(s):        ---  Professional ---                           Z86.010, Personal history of colonic polyps                           Z90.79, Acquired absence of other genital organ(s)                           K57.30, Diverticulosis of large intestine without                            perforation or abscess without bleeding CPT copyright 2019 American Medical Association. All rights reserved. The codes documented in this report are preliminary and upon coder review may  be revised to meet current compliance requirements. Gatha Mayer, MD 05/18/2020 11:14:35 AM This report has been signed electronically. Number of Addenda: 0

## 2020-05-18 NOTE — Anesthesia Postprocedure Evaluation (Signed)
Anesthesia Post Note  Patient: Preston Pennington  Procedure(s) Performed: COLONOSCOPY WITH PROPOFOL (N/A )     Anesthesia Type: MAC   No complications documented.  Last Vitals:  Vitals:   05/18/20 1133 05/18/20 1134  BP: (!) 138/97   Pulse: 64   Resp: 14   Temp:    SpO2: 99% 97%    Last Pain:  Vitals:   05/18/20 1133  TempSrc:   PainSc: 0-No pain                 ,

## 2020-05-18 NOTE — Transfer of Care (Signed)
Immediate Anesthesia Transfer of Care Note  Patient: Preston Pennington  Procedure(s) Performed: COLONOSCOPY WITH PROPOFOL (N/A )  Patient Location: Endoscopy Unit  Anesthesia Type:MAC  Level of Consciousness: drowsy  Airway & Oxygen Therapy: Patient Spontanous Breathing and Patient connected to face mask oxygen  Post-op Assessment: Report given to RN and Post -op Vital signs reviewed and stable  Post vital signs: Reviewed and stable  Last Vitals:  Vitals Value Taken Time  BP    Temp    Pulse    Resp    SpO2      Last Pain:  Vitals:   05/18/20 1001  TempSrc: Oral  PainSc: 0-No pain         Complications: No complications documented.

## 2020-05-18 NOTE — Anesthesia Preprocedure Evaluation (Signed)
Anesthesia Evaluation  Patient identified by MRN, date of birth, ID band Patient awake    Reviewed: Allergy & Precautions, H&P , NPO status , Patient's Chart, lab work & pertinent test results, reviewed documented beta blocker date and time   History of Anesthesia Complications (+) DIFFICULT AIRWAY and history of anesthetic complications  Airway Mallampati: II  TM Distance: >3 FB Neck ROM: full  Mouth opening: Limited Mouth Opening  Dental no notable dental hx. (+) Teeth Intact, Dental Advisory Given   Pulmonary neg pulmonary ROS,    Pulmonary exam normal breath sounds clear to auscultation       Cardiovascular hypertension, negative cardio ROS   Rhythm:regular Rate:Normal     Neuro/Psych negative neurological ROS  negative psych ROS   GI/Hepatic negative GI ROS, Neg liver ROS,   Endo/Other  negative endocrine ROSdiabetes  Renal/GU negative Renal ROS  negative genitourinary   Musculoskeletal negative musculoskeletal ROS (+)   Abdominal   Peds negative pediatric ROS (+)  Hematology negative hematology ROS (+)   Anesthesia Other Findings   Reproductive/Obstetrics negative OB ROS                             Anesthesia Physical  Anesthesia Plan  ASA: II  Anesthesia Plan: MAC   Post-op Pain Management:    Induction: Intravenous  PONV Risk Score and Plan: Treatment may vary due to age or medical condition  Airway Management Planned: Mask and Natural Airway  Additional Equipment:   Intra-op Plan:   Post-operative Plan:   Informed Consent: I have reviewed the patients History and Physical, chart, labs and discussed the procedure including the risks, benefits and alternatives for the proposed anesthesia with the patient or authorized representative who has indicated his/her understanding and acceptance.     Dental Advisory Given  Plan Discussed with: CRNA, Surgeon and  Anesthesiologist  Anesthesia Plan Comments:         Anesthesia Quick Evaluation

## 2020-05-19 ENCOUNTER — Encounter (HOSPITAL_COMMUNITY): Payer: Self-pay | Admitting: Internal Medicine

## 2020-05-20 ENCOUNTER — Other Ambulatory Visit: Payer: Self-pay | Admitting: Family Medicine

## 2020-05-26 DIAGNOSIS — M722 Plantar fascial fibromatosis: Secondary | ICD-10-CM | POA: Diagnosis not present

## 2020-05-31 ENCOUNTER — Ambulatory Visit (INDEPENDENT_AMBULATORY_CARE_PROVIDER_SITE_OTHER): Payer: Medicare HMO | Admitting: Family Medicine

## 2020-05-31 ENCOUNTER — Ambulatory Visit: Payer: Self-pay

## 2020-05-31 ENCOUNTER — Other Ambulatory Visit: Payer: Self-pay

## 2020-05-31 ENCOUNTER — Encounter: Payer: Self-pay | Admitting: Family Medicine

## 2020-05-31 VITALS — BP 130/78 | HR 79 | Ht 67.0 in | Wt 162.0 lb

## 2020-05-31 DIAGNOSIS — M25562 Pain in left knee: Secondary | ICD-10-CM

## 2020-05-31 DIAGNOSIS — M25561 Pain in right knee: Secondary | ICD-10-CM

## 2020-05-31 DIAGNOSIS — M722 Plantar fascial fibromatosis: Secondary | ICD-10-CM

## 2020-05-31 DIAGNOSIS — M79671 Pain in right foot: Secondary | ICD-10-CM | POA: Diagnosis not present

## 2020-05-31 DIAGNOSIS — M79672 Pain in left foot: Secondary | ICD-10-CM | POA: Diagnosis not present

## 2020-05-31 DIAGNOSIS — M79641 Pain in right hand: Secondary | ICD-10-CM

## 2020-05-31 DIAGNOSIS — G8929 Other chronic pain: Secondary | ICD-10-CM | POA: Diagnosis not present

## 2020-05-31 NOTE — Patient Instructions (Addendum)
Thank you for coming in today. Let me know if the hand pain returns.  For the heel do the exercise from up to down slowly.  Continue ice, and cushion insoles.  If not good enough I can do an injection.  Ok to do voltaren gel on the knees and feet.  Recheck with me as needed.

## 2020-05-31 NOTE — Progress Notes (Signed)
   I, Wendy Poet, LAT, ATC, am serving as scribe for Dr. Lynne Leader.  Preston Pennington is a 69 y.o. male who presents to [place] at [facility] today for f/u of R 4th MC pain.  He was last seen by Dr. Georgina Snell on 04/15/20 and had an aspiration and injection of a ganglion cyst in palmar aspect of his R hand.  Since his last visit, pt reports that his L hand pain has resolved since his last visit when he had the injection/aspiration.  He reports bilateral knee and bilateral heel pain as well.  He said these issues for some time and doing cushioned insoles and Voltaren gel and icing.  This helps a lot.  He has not tried any exercises for his feet yet.  He does not think either problem is bad enough that an injection makes sense.   Pertinent review of systems: No fevers or chills  Relevant historical information: History right lower leg fracture with resultant leg length discrepancy corrected with special shoe   Exam:  BP (!) 130/78 (BP Location: Left Arm, Patient Position: Sitting, Cuff Size: Normal)   Pulse 79   Ht 5\' 7"  (1.702 m)   Wt 162 lb (73.5 kg)   SpO2 97%   BMI 25.37 kg/m  General: Well Developed, well nourished, and in no acute distress.   MSK: Right hand take toward palmar aspect of hand consistent with early Dupuytren's present.  Nontender with no nodularity.  Normal hand motion.  Feet bilaterally tender palpation plantar calcaneus otherwise normal-appearing and nontender. Normal foot and ankle motion.  Knees bilaterally normal motion with mild crepitation.     Assessment and Plan: 69 y.o. male with hand pain.  Patient had a ganglion cyst on what looked like a Dupuytren's contracture and is much better following injection.  Plan for continued hand stretching and recheck as needed.  Foot pain bilaterally plantar fasciitis.  Advance conservative measures to include eccentric exercises.  Recheck back if needed for injection.  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  Again Voltaren gel and icing.  Could consider injection in the future.  Recheck back as needed.    Discussed warning signs or symptoms. Please see discharge instructions. Patient expresses understanding.   The above documentation has been reviewed and is accurate and complete Lynne Leader, M.D.

## 2020-06-07 ENCOUNTER — Other Ambulatory Visit: Payer: Self-pay | Admitting: Family Medicine

## 2020-06-08 ENCOUNTER — Other Ambulatory Visit: Payer: Self-pay | Admitting: Family Medicine

## 2020-06-09 ENCOUNTER — Ambulatory Visit (INDEPENDENT_AMBULATORY_CARE_PROVIDER_SITE_OTHER): Payer: Medicare HMO | Admitting: Family Medicine

## 2020-06-09 ENCOUNTER — Other Ambulatory Visit: Payer: Self-pay

## 2020-06-09 ENCOUNTER — Encounter: Payer: Self-pay | Admitting: Family Medicine

## 2020-06-09 VITALS — BP 134/78 | HR 82 | Temp 99.0°F | Ht 67.0 in | Wt 161.0 lb

## 2020-06-09 DIAGNOSIS — T63481A Toxic effect of venom of other arthropod, accidental (unintentional), initial encounter: Secondary | ICD-10-CM | POA: Diagnosis not present

## 2020-06-09 DIAGNOSIS — S40862A Insect bite (nonvenomous) of left upper arm, initial encounter: Secondary | ICD-10-CM | POA: Diagnosis not present

## 2020-06-09 DIAGNOSIS — I10 Essential (primary) hypertension: Secondary | ICD-10-CM | POA: Diagnosis not present

## 2020-06-09 DIAGNOSIS — E1159 Type 2 diabetes mellitus with other circulatory complications: Secondary | ICD-10-CM

## 2020-06-09 DIAGNOSIS — I152 Hypertension secondary to endocrine disorders: Secondary | ICD-10-CM

## 2020-06-09 NOTE — Patient Instructions (Addendum)
Health Maintenance Due  Topic Date Due  . INFLUENZA VACCINE -let us know when you have gotten this 05/30/2020   Try benadryl 12.5 mg (half of 25 mg pill) every 6 hours for next 24 hours. Stop if you feel like it makes it hard to urinate.   I want you to ice the area 10-20 minutes at least 3x a day for next 2 days.   Can take tylenol 1000mg  every 8 hours or 500mg  every 4 hours. Do not exceed 3000mg  in 24 hours.   Usually these will calm down within a week or two- if you continue to have progressive redness or worsening pain we may have to use an antibiotic so stay in touch   Give me an update on Friday if you dont mind

## 2020-06-09 NOTE — Progress Notes (Signed)
Phone [phone] In person visit   Subjective:   Preston Pennington is a 69 y.o. year old very pleasant male patient who presents for/with See problem oriented charting Chief Complaint  Patient presents with  . Follow-up    insect bite    This visit occurred during the SARS-CoV-2 public health emergency.  Safety protocols were in place, including screening questions prior to the visit, additional usage of staff PPE, and extensive cleaning of exam room while observing appropriate contact time as indicated for disinfecting solutions.   Past Medical History-  Patient Active Problem List   Diagnosis Date Noted  . Diabetes mellitus type II, controlled ([place]) 04/09/2013    Priority: High  . 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 Hyperlipidemia associated with type 2 diabetes mellitus ([place]) 07/09/2018    Priority: Medium  . Left cervical radiculopathy 09/24/2017    Priority: Medium  . Hx of colonic polyps 11/06/2016    Priority: Medium  . Chronic ankle pain 06/14/2016    Priority: Medium  . Thrombocytopenia ([place]) 11/29/2015    Priority: Medium  . Gout 04/30/2015    Priority: Medium  . Hypertension associated with diabetes ([place]) 04/09/2013    Priority: Medium  . BPH (benign prostatic hyperplasia) 04/09/2013    Priority: Medium  . Leg length difference, acquired 06/20/2019    Priority: Low  . Arthritis of first metatarsophalangeal (MTP) joint of right foot 05/30/2019    Priority: Low  . Sesamoiditis 11/08/2018    Priority: Low  . Osteoarthritis of spine with radiculopathy, cervical region 11/14/2017    Priority: Low  . Right knee pain 11/21/2016    Priority: Low  . Erectile dysfunction 02/04/2015    Priority: Low    Medications- reviewed and updated Current Outpatient Medications  Medication Sig Dispense Refill  . ACCU-CHEK GUIDE test strip USE  STRIP TO CHECK GLUCOSE 4 TIMES DAILY AS  DIRECTED 100 each 0  . Accu-Chek Softclix  Lancets lancets 4 (four) times daily.    Marland Kitchen acetaminophen (TYLENOL) 500 MG tablet Take 1,000 mg by mouth every 6 (six) hours as needed for moderate pain.     Marland Kitchen allopurinol (ZYLOPRIM) 100 MG tablet Take 1 tablet by mouth once daily 30 tablet 2  . amLODipine (NORVASC) 10 MG tablet Take 1 tablet by mouth once daily (Patient taking differently: Take 10 mg by mouth daily. ) 90 tablet 0  . ammonium lactate (LAC-HYDRIN) 12 % cream Apply to the area of callus 2-3 times a day 385 g 2  . atorvastatin (LIPITOR) 10 MG tablet Take 1 tablet by mouth once a week (Patient taking differently: Take 10 mg by mouth every Saturday. In the morning.) 13 tablet 0  . blood glucose meter kit and supplies KIT . Use up to four times daily as directed. 1 each 0  . Camphor-Eucalyptus-Menthol (VICKS VAPORUB EX) Apply 1 application topically at bedtime as needed (pain/ congestions).    . colchicine (COLCRYS) 0.6 MG tablet TAKE 2 CAPSULES BY MOUTH AT THE FIRST SIGN OF GOUT THEN TAKE 1 CAPSULE 2 HOURS LATER THEN 1 CAPSULE DAILY UNTIL FLARE IS RESOLVED (Patient taking differently: Take 0.6-1.2 mg by mouth 2 (two) times daily as needed (gout flares.). TAKE 2 CAPSULES BY MOUTH AT THE FIRST SIGN OF GOUT THEN TAKE 1 CAPSULE 2 HOURS LATER THEN 1 CAPSULE DAILY UNTIL FLARE IS RESOLVED) 30 tablet 5  . diclofenac Sodium (VOLTAREN) 1 % GEL APPLY 2 GRAMS TOPICALLY  TO AFFECTED AREAS FOUR TIMES DAILY 100 g 0  . feeding supplement, ENSURE ENLIVE, (ENSURE ENLIVE) LIQD Take 1 Bottle by mouth daily.    Marland Kitchen gabapentin (NEURONTIN) 300 MG capsule Take 1 capsule (300 mg total) by mouth in the morning and at bedtime. 60 capsule 4  . glipiZIDE (GLUCOTROL) 5 MG tablet Take 1 tablet (5 mg total) by mouth 2 (two) times daily before a meal. 180 tablet 2  . latanoprost (XALATAN) 0.005 % ophthalmic solution Place 1 drop into both eyes at bedtime.     . meloxicam (MOBIC) 7.5 MG tablet Take 1 tablet (7.5 mg total) by mouth daily. 30 tablet 0  . metFORMIN (GLUCOPHAGE)  1000 MG tablet TAKE 1 TABLET BY MOUTH TWICE DAILY WITH MEALS 180 tablet 0  . Polyethyl Glycol-Propyl Glycol (SYSTANE) 0.4-0.3 % SOLN Apply to eye. For dry eyes 3 x a day    . quinapril (ACCUPRIL) 20 MG tablet TAKE 1 TABLET BY MOUTH AT BEDTIME 90 tablet 0  . Tetrahyd-Glyc-Hypro-PEG-ZnSulf (VISINE TOTALITY MULTI-SYMPTOM OP) Apply 1 drop to eye in the morning, at noon, and at bedtime.      No current facility-administered medications for this visit.     Objective:  BP 134/78   Pulse 82   Temp 99 F (37.2 C)   Ht '5\' 7"'  (1.702 m)   Wt 161 lb (73 kg)   SpO2 96%   BMI 25.22 kg/m  Gen: NAD, resting comfortably No lip or tongue swelling CV: RRR no murmurs rubs or gallops Lungs: CTAB no crackles, wheeze, rhonchi Ext: no edema Skin: warm, dry, left upper arm with area 1-2 mm site of bite or sting. Surrounding this area of erythema and warmth down about 5-7 cm    Assessment and Plan  Insect Bite- left upper arm S: pt states he got bitten by something yesterday around 2:30pm yesterday and now it is itching and painful. He is unsure of what bit or stung him. He noted immediate pain but pain has increased and swelling around the site. At present up to 7/10 pain. Has tried tylenol 1045m. Has not tried ice yet.   A/P: severe local reaction left upper arm to insect bite . Up to date on tdap in 2017.   From avs  " Try benadryl 12.5 mg (half of 25 mg pill) every 6 hours for next 24 hours. Stop if you feel like it makes it hard to urinate.   I want you to ice the area 10-20 minutes at least 3x a day for next 2 days.   Can take tylenol 10036mevery 8 hours or 50062mvery 4 hours. Do not exceed 3000m78m 24 hours.   Usually these will calm down within a week or two- if you continue to have progressive redness or worsening pain we may have to use an antibiotic so stay in touch "  I asked him to update me by Friday - consider keflex- I am concerned about cellulitis. I also told him with BPH history  s/p prostatectomy to stop benadryl if any urinary retention problems  #hypertension S: medication: amlodipine 10mg62m quinapril 20mg 63my. BP Readings from Last 3 Encounters:  06/09/20 134/78  05/31/20 (!) 130/78  05/18/20 (!) 138/97  A/P: initial bp elevated but improved on recheck- continue current meds  Recommended follow up:  Future Appointments  Date Time Provider [place]art[place]/2021  8:40 AM [facility]    Lab/Order associations:   ICD-10-CM  1. Local reaction to insect sting, accidental or unintentional, initial encounter  T63.481A   2. Insect bite (nonvenomous) of left upper arm, initial encounter (CODE)  S40.862A   3. Hypertension associated with diabetes ([place])  E11.59    I10    Return precautions advised.  Garret Reddish, MD

## 2020-06-10 DIAGNOSIS — Z03818 Encounter for observation for suspected exposure to other biological agents ruled out: Secondary | ICD-10-CM | POA: Diagnosis not present

## 2020-06-11 ENCOUNTER — Telehealth: Payer: Self-pay | Admitting: Family Medicine

## 2020-06-11 MED ORDER — CEPHALEXIN 500 MG PO CAPS
500.0000 mg | ORAL_CAPSULE | Freq: Three times a day (TID) | ORAL | 0 refills | Status: AC
Start: 2020-06-11 — End: 2020-06-18

## 2020-06-11 NOTE — Telephone Encounter (Signed)
I sent in Keflex for him-please update him.  If not starting to improve by Monday have him see [place] back-if becomes febrile after 24 hours on antibiotic-consider seeking care over the weekend

## 2020-06-11 NOTE — Telephone Encounter (Signed)
Called patient reviewed information. He will start abx if still has fever in 24 hrs will go to walk in. If no improvement Monday he will call for app.

## 2020-06-11 NOTE — Telephone Encounter (Signed)
Patient called it to let Dr. Yong Channel he was not getting any better the redness is getting worse plus his temp was 99.6 yesterday. Patient would like to see if something could be sent in for him.

## 2020-06-11 NOTE — Telephone Encounter (Signed)
Please advise 

## 2020-06-11 NOTE — Telephone Encounter (Signed)
Patient called back regarding this issue and requested to speak to a nurse about getting some help.

## 2020-07-04 ENCOUNTER — Other Ambulatory Visit: Payer: Self-pay | Admitting: Family Medicine

## 2020-07-19 ENCOUNTER — Other Ambulatory Visit: Payer: Self-pay | Admitting: Family Medicine

## 2020-07-19 MED ORDER — AMLODIPINE BESYLATE 10 MG PO TABS: 10.0000 mg | ORAL_TABLET | Freq: Every day | ORAL | 0 refills | Status: DC

## 2020-07-19 MED ORDER — ALLOPURINOL 100 MG PO TABS: 100.0000 mg | ORAL_TABLET | Freq: Every day | ORAL | 0 refills | Status: DC

## 2020-07-19 NOTE — Telephone Encounter (Signed)
MEDICATION:  allopurinol (ZYLOPRIM) 100 MG tablet amLODipine (NORVASC) 10 MG tablet  PHARMACY:  [facility], [place]. Phone:  [phone]  Fax:  [phone]       Comments:   **Let patient know to contact pharmacy at the end of the day to make sure medication is ready. **  ** Please notify patient to allow 48-72 hours to process**  **Encourage patient to contact the pharmacy for refills or they can request refills through [facility]**

## 2020-07-26 NOTE — Progress Notes (Signed)
Phone [phone] In person visit   Subjective:   Preston Pennington is a 69 y.o. year old very pleasant male patient who presents for/with See problem oriented charting Chief Complaint  Patient presents with  . Diabetes  . Hypertension  . Hyperlipidemia    This visit occurred during the SARS-CoV-2 public health emergency.  Safety protocols were in place, including screening questions prior to the visit, additional usage of staff PPE, and extensive cleaning of exam room while observing appropriate contact time as indicated for disinfecting solutions.   Past Medical History-  Patient Active Problem List   Diagnosis Date Noted  . Diabetes mellitus type II, controlled ([place]) 04/09/2013    Priority: High  . 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 Hyperlipidemia associated with type 2 diabetes mellitus ([place]) 07/09/2018    Priority: Medium  . Left cervical radiculopathy 09/24/2017    Priority: Medium  . Hx of colonic polyps 11/06/2016    Priority: Medium  . Chronic ankle pain 06/14/2016    Priority: Medium  . Thrombocytopenia ([place]) 11/29/2015    Priority: Medium  . Gout 04/30/2015    Priority: Medium  . Hypertension associated with diabetes ([place]) 04/09/2013    Priority: Medium  . BPH (benign prostatic hyperplasia) 04/09/2013    Priority: Medium  . Leg length difference, acquired 06/20/2019    Priority: Low  . Arthritis of first metatarsophalangeal (MTP) joint of right foot 05/30/2019    Priority: Low  . Sesamoiditis 11/08/2018    Priority: Low  . Osteoarthritis of spine with radiculopathy, cervical region 11/14/2017    Priority: Low  . Right knee pain 11/21/2016    Priority: Low  . Erectile dysfunction 02/04/2015    Priority: Low    Medications- reviewed and updated Current Outpatient Medications  Medication Sig Dispense Refill  . ACCU-CHEK GUIDE test strip USE  STRIP TO CHECK GLUCOSE 4 TIMES DAILY AS  DIRECTED 100 each 0  .  Accu-Chek Softclix Lancets lancets 4 (four) times daily.    Marland Kitchen acetaminophen (TYLENOL) 500 MG tablet Take 1,000 mg by mouth every 6 (six) hours as needed for moderate pain.     Marland Kitchen allopurinol (ZYLOPRIM) 100 MG tablet Take 1 tablet (100 mg total) by mouth daily. 90 tablet 0  . amLODipine (NORVASC) 10 MG tablet Take 1 tablet (10 mg total) by mouth daily. 90 tablet 0  . atorvastatin (LIPITOR) 10 MG tablet Take 1 tablet by mouth once a week (Patient taking differently: Take 10 mg by mouth every Saturday. In the morning.) 13 tablet 0  . Camphor-Eucalyptus-Menthol (VICKS VAPORUB EX) Apply 1 application topically at bedtime as needed (pain/ congestions).    . colchicine (COLCRYS) 0.6 MG tablet TAKE 2 CAPSULES BY MOUTH AT THE FIRST SIGN OF GOUT THEN TAKE 1 CAPSULE 2 HOURS LATER THEN 1 CAPSULE DAILY UNTIL FLARE IS RESOLVED (Patient taking differently: Take 0.6-1.2 mg by mouth 2 (two) times daily as needed (gout flares.). TAKE 2 CAPSULES BY MOUTH AT THE FIRST SIGN OF GOUT THEN TAKE 1 CAPSULE 2 HOURS LATER THEN 1 CAPSULE DAILY UNTIL FLARE IS RESOLVED) 30 tablet 5  . diclofenac Sodium (VOLTAREN) 1 % GEL APPLY 2 GRAMS TOPICALLY TO AFFECTED AREAS 4 TIMES DAILY 100 g 0  . feeding supplement, ENSURE ENLIVE, (ENSURE ENLIVE) LIQD Take 1 Bottle by mouth daily.    Marland Kitchen gabapentin (NEURONTIN) 300 MG capsule Take 1 capsule (300 mg total) by mouth in the morning and at bedtime.  60 capsule 4  . glipiZIDE (GLUCOTROL) 5 MG tablet Take 1 tablet (5 mg total) by mouth 2 (two) times daily before a meal. 180 tablet 2  . latanoprost (XALATAN) 0.005 % ophthalmic solution Place 1 drop into both eyes at bedtime.     . metFORMIN (GLUCOPHAGE) 1000 MG tablet TAKE 1 TABLET BY MOUTH TWICE DAILY WITH MEALS 180 tablet 0  . Polyethyl Glycol-Propyl Glycol (SYSTANE) 0.4-0.3 % SOLN Apply to eye. For dry eyes 3 x a day    . quinapril (ACCUPRIL) 20 MG tablet TAKE 1 TABLET BY MOUTH AT BEDTIME 90 tablet 0  . Tetrahyd-Glyc-Hypro-PEG-ZnSulf (VISINE TOTALITY  MULTI-SYMPTOM OP) Apply 1 drop to eye in the morning, at noon, and at bedtime.      No current facility-administered medications for this visit.     Objective:  BP 122/84   Pulse 75   Temp 98.6 F (37 C) (Temporal)   Resp 18   Ht 5\' 7"  (1.702 m)   Wt 162 lb 9.6 oz (73.8 kg)   SpO2 95%   BMI 25.47 kg/m  Gen: NAD, resting comfortably CV: RRR no murmurs rubs or gallops Lungs: CTAB no crackles, wheeze, rhonchi Ext: no edema Skin: warm, dry     Assessment and Plan   # high dose flu shot today  # Diabetes S: compliant with on Metformin 1000 mg BID and Glipizide 5 mg twice daily.   Lab Results  Component Value Date   HGBA1C 6.2 02/26/2020  CBGs- below 150 most of the time Exercise and diet- walking 15 minutes at least 3x a week. Diet reasonably healthy  A/P: Hopefully controlled-update A1c with labs.  Continue current medications for now  #hypertension S: compliant with Amlodipine 10 mg once daily and Quinapril 20 mg daily.  A/P: Excellent control-continue amlodipine and quinapril  #hyperlipidemia S: compliant with Atorvastatin 10 mg once weekly.  Lab Results  Component Value Date   CHOL 140 07/10/2019   HDL 42.20 07/10/2019   LDLCALC 83 07/10/2019   LDLDIRECT 90.0 02/26/2020   TRIG 76.0 07/10/2019   CHOLHDL 3 07/10/2019   A/P: Slightly elevated on last check-we will update a lipid panel today and if LDL is above 70 we discussed possible trial of atorvastatin 20 mg twice weekly.  I thought he had myalgias on higher doses but he does not recall this so we will trial higher dose if needed  % BPH status post robotic prostatectomy December 2018- Follows with Dr. Alyson Ingles at [facility] urology.   For elevated PSA-biopsy in 2017 reassuring. Was told no retention last visit   # Gout S: compliant with Allopurinol 100 mg daily and Colchicine 0.6 mg prn.   Lab Results  Component Value Date   LABURIC 4.5 11/20/2019  A/P: no recent flares- stable- continue current medicines    # Thrombocytopenia/leukopenia S:platelets generally around 100. he states has been like this for over 2 years.  Intermittent leukopenia (ok last check)- typically get cbc with differential Lab Results  Component Value Date   WBC 5.1 02/26/2020   HGB 14.4 02/26/2020   HCT 44.1 02/26/2020   MCV 81.4 02/26/2020   PLT 91.0 (L) 02/26/2020   07/04/18 path smear review "Myeloid population consists predominantly of mature  segmented neutrophils. No immature cells are identified. RBCs appear to be microcytic and hypochromic on smear review. Suggest evaluation for iron deficiency, if clinically indicated. Thrombocytopenia with some large platelets seen. No platelet clumps identified. Reviewed by Francis Gaines Mammarappallil, MD "  Iron levels  normal 07/10/2019 A/P: hopefully platelets stable-if worsening issues/cell linesconsider referral to hematology for further work-up.  If stable-continue to monitor  # hand pain on right better since injection June 2021  Recommended follow up: Return in about 26 weeks (around 01/25/2021) for physical or sooner if needed.  Lab/Order associations:   ICD-10-CM   1. Hypertension associated with diabetes ([place])  E11.59    I10   2. Controlled type 2 diabetes mellitus without complication, without long-term current use of insulin (H[facility])  E11.9 CBC With Differential/Platelet    COMPLETE METABOLIC PANEL WITH GFR    Hemoglobin A1c  3. Hyperlipidemia associated with type 2 diabetes mellitus ([facility])  E11.69 Lipid Panel (Refl)   E78.5   4. Idiopathic chronic gout of multiple sites without tophus  M1A.09X0   5. Thrombocytopenia ([place])  D69.6     No orders of the defined types were placed in this encounter.   Return precautions advised.  Garret Reddish, MD

## 2020-07-26 NOTE — Patient Instructions (Addendum)
Health Maintenance Due  Topic Date Due  . INFLUENZA VACCINE In office flu shot today high dose 05/30/2020   Schedule physical at least 1 year out from last one in march 2021  Happy Early Birthday!   Please stop by lab before you go If you have mychart- we will send your results within 3 business days of [place] receiving them.  If you do not have mychart- we will call you about results within 5 business days of [place] receiving them.  *please note we are currently using [facility] which has a longer processing time than [facility] typically so labs may not come back as quickly as in the past *please also note that you will see labs on mychart as soon as they post. I will later go in and write notes on them- will say "notes from Dr. Yong Channel"

## 2020-07-27 ENCOUNTER — Encounter: Payer: Self-pay | Admitting: Family Medicine

## 2020-07-27 ENCOUNTER — Other Ambulatory Visit: Payer: Self-pay

## 2020-07-27 ENCOUNTER — Ambulatory Visit (INDEPENDENT_AMBULATORY_CARE_PROVIDER_SITE_OTHER): Payer: Medicare HMO | Admitting: Family Medicine

## 2020-07-27 VITALS — BP 122/84 | HR 75 | Temp 98.6°F | Resp 18 | Ht 67.0 in | Wt 162.6 lb

## 2020-07-27 DIAGNOSIS — I10 Essential (primary) hypertension: Secondary | ICD-10-CM | POA: Diagnosis not present

## 2020-07-27 DIAGNOSIS — E1169 Type 2 diabetes mellitus with other specified complication: Secondary | ICD-10-CM

## 2020-07-27 DIAGNOSIS — D696 Thrombocytopenia, unspecified: Secondary | ICD-10-CM

## 2020-07-27 DIAGNOSIS — M1A09X0 Idiopathic chronic gout, multiple sites, without tophus (tophi): Secondary | ICD-10-CM

## 2020-07-27 DIAGNOSIS — Z23 Encounter for immunization: Secondary | ICD-10-CM

## 2020-07-27 DIAGNOSIS — E119 Type 2 diabetes mellitus without complications: Secondary | ICD-10-CM

## 2020-07-27 DIAGNOSIS — E785 Hyperlipidemia, unspecified: Secondary | ICD-10-CM

## 2020-07-27 DIAGNOSIS — E1159 Type 2 diabetes mellitus with other circulatory complications: Secondary | ICD-10-CM | POA: Diagnosis not present

## 2020-07-27 DIAGNOSIS — I152 Hypertension secondary to endocrine disorders: Secondary | ICD-10-CM

## 2020-07-27 NOTE — Addendum Note (Signed)
Addended by: Thomes Cake on: 07/27/2020 09:34 AM   Modules accepted: Orders

## 2020-07-27 NOTE — Addendum Note (Signed)
Addended by: Milton Ferguson D on: 07/27/2020 09:37 AM   Modules accepted: Orders

## 2020-07-28 LAB — CBC WITH DIFFERENTIAL/PLATELET
Absolute Monocytes: 344 cells/uL (ref 200–950)
Basophils Absolute: 40 cells/uL (ref 0–200)
Basophils Relative: 1 %
Eosinophils Absolute: 252 cells/uL (ref 15–500)
Eosinophils Relative: 6.3 %
HCT: 45.6 % (ref 38.5–50.0)
Hemoglobin: 14 g/dL (ref 13.2–17.1)
Lymphs Abs: 1692 cells/uL (ref 850–3900)
MCH: 25.5 pg — ABNORMAL LOW (ref 27.0–33.0)
MCHC: 30.7 g/dL — ABNORMAL LOW (ref 32.0–36.0)
MCV: 83.1 fL (ref 80.0–100.0)
Monocytes Relative: 8.6 %
Neutro Abs: 1672 cells/uL (ref 1500–7800)
Neutrophils Relative %: 41.8 %
Platelets: 95 10*3/uL — ABNORMAL LOW (ref 140–400)
RBC: 5.49 10*6/uL (ref 4.20–5.80)
RDW: 12.9 % (ref 11.0–15.0)
Total Lymphocyte: 42.3 %
WBC: 4 10*3/uL (ref 3.8–10.8)

## 2020-07-28 LAB — COMPLETE METABOLIC PANEL WITH GFR
AG Ratio: 1.7 (calc) (ref 1.0–2.5)
ALT: 14 U/L (ref 9–46)
AST: 15 U/L (ref 10–35)
Albumin: 4 g/dL (ref 3.6–5.1)
Alkaline phosphatase (APISO): 68 U/L (ref 35–144)
BUN: 16 mg/dL (ref 7–25)
CO2: 26 mmol/L (ref 20–32)
Calcium: 9.4 mg/dL (ref 8.6–10.3)
Chloride: 103 mmol/L (ref 98–110)
Creat: 0.95 mg/dL (ref 0.70–1.18)
GFR, Est African American: 94 mL/min/{1.73_m2} (ref >=60)
GFR, Est Non African American: 81 mL/min/{1.73_m2} (ref >=60)
Globulin: 2.3 g/dL (calc) (ref 1.9–3.7)
Glucose, Bld: 222 mg/dL — ABNORMAL HIGH (ref 65–99)
Potassium: 4.2 mmol/L (ref 3.5–5.3)
Sodium: 138 mmol/L (ref 135–146)
Total Bilirubin: 0.4 mg/dL (ref 0.2–1.2)
Total Protein: 6.3 g/dL (ref 6.1–8.1)

## 2020-07-28 LAB — LIPID PANEL (REFL)
Cholesterol: 132 mg/dL (ref <200)
HDL: 42 mg/dL (ref >=40)
LDL Cholesterol (Calc): 73 mg/dL (calc)
Non-HDL Cholesterol (Calc): 90 mg/dL (calc) (ref <130)
Total CHOL/HDL Ratio: 3.1 (calc) (ref <5.0)
Triglycerides: 89 mg/dL (ref <150)

## 2020-07-28 LAB — HEMOGLOBIN A1C
Hgb A1c MFr Bld: 6.3 % of total Hgb — ABNORMAL HIGH (ref <5.7)
Mean Plasma Glucose: 134 (calc)
eAG (mmol/L): 7.4 (calc)

## 2020-07-30 ENCOUNTER — Other Ambulatory Visit: Payer: Self-pay

## 2020-07-30 MED ORDER — ATORVASTATIN CALCIUM 10 MG PO TABS
10.0000 mg | ORAL_TABLET | ORAL | 3 refills | Status: DC
Start: 2020-08-02 — End: 2021-06-10

## 2020-08-04 ENCOUNTER — Other Ambulatory Visit: Payer: Self-pay | Admitting: Family Medicine

## 2020-08-10 ENCOUNTER — Other Ambulatory Visit: Payer: Self-pay | Admitting: Family Medicine

## 2020-08-24 ENCOUNTER — Other Ambulatory Visit: Payer: Self-pay | Admitting: Family Medicine

## 2020-09-03 ENCOUNTER — Other Ambulatory Visit: Payer: Self-pay | Admitting: Family Medicine

## 2020-09-13 DIAGNOSIS — H401131 Primary open-angle glaucoma, bilateral, mild stage: Secondary | ICD-10-CM | POA: Diagnosis not present

## 2020-09-22 ENCOUNTER — Other Ambulatory Visit: Payer: Self-pay | Admitting: Family Medicine

## 2020-10-08 ENCOUNTER — Other Ambulatory Visit: Payer: Self-pay | Admitting: Family Medicine

## 2020-10-19 ENCOUNTER — Telehealth: Payer: Self-pay

## 2020-10-19 MED ORDER — AMLODIPINE BESYLATE 10 MG PO TABS: 10.0000 mg | ORAL_TABLET | Freq: Every day | ORAL | 0 refills | Status: DC

## 2020-10-19 NOTE — Telephone Encounter (Signed)
Medication has been sent to the patient's pharmacy.  

## 2020-10-19 NOTE — Telephone Encounter (Signed)
  LAST APPOINTMENT DATE: 10/08/2020   NEXT APPOINTMENT DATE:@3 /30/2022  MEDICATION: amLODipine (NORVASC) 10 MG tablet  [place].  COMMENTS: Patient is completely out of medication.

## 2020-10-26 ENCOUNTER — Telehealth: Payer: Self-pay

## 2020-10-26 MED ORDER — DICLOFENAC SODIUM 1 % EX GEL: CUTANEOUS | 0 refills | Status: DC

## 2020-10-26 NOTE — Telephone Encounter (Signed)
Medication has been sent to the patient's pharmacy.  

## 2020-10-26 NOTE — Telephone Encounter (Signed)
MEDICATION: diclofenac Sodium (VOLTAREN) 1 % GEL   PHARMACY:  [id number] - [place], [place] - [address] Phone:  [phone]  Fax:  [phone]       Comments:   **Let patient know to contact pharmacy at the end of the day to make sure medication is ready. **  ** Please notify patient to allow 48-72 hours to process**  **Encourage patient to contact the pharmacy for refills or they can request refills through [facility]**

## 2020-11-10 ENCOUNTER — Other Ambulatory Visit: Payer: Self-pay | Admitting: Family Medicine

## 2020-11-17 ENCOUNTER — Other Ambulatory Visit: Payer: Self-pay | Admitting: Family Medicine

## 2020-11-24 ENCOUNTER — Other Ambulatory Visit: Payer: Self-pay | Admitting: Family Medicine

## 2020-12-10 ENCOUNTER — Other Ambulatory Visit: Payer: Self-pay | Admitting: Family Medicine

## 2020-12-14 ENCOUNTER — Other Ambulatory Visit: Payer: Self-pay | Admitting: Family Medicine

## 2020-12-23 ENCOUNTER — Other Ambulatory Visit: Payer: Self-pay | Admitting: Family Medicine

## 2021-01-13 ENCOUNTER — Other Ambulatory Visit: Payer: Self-pay | Admitting: Family Medicine

## 2021-01-15 ENCOUNTER — Other Ambulatory Visit: Payer: Self-pay | Admitting: Family Medicine

## 2021-01-17 ENCOUNTER — Encounter: Payer: Self-pay | Admitting: Family Medicine

## 2021-01-17 DIAGNOSIS — H401131 Primary open-angle glaucoma, bilateral, mild stage: Secondary | ICD-10-CM | POA: Diagnosis not present

## 2021-01-17 DIAGNOSIS — H5203 Hypermetropia, bilateral: Secondary | ICD-10-CM | POA: Diagnosis not present

## 2021-01-17 DIAGNOSIS — E119 Type 2 diabetes mellitus without complications: Secondary | ICD-10-CM | POA: Diagnosis not present

## 2021-01-17 LAB — HM DIABETES EYE EXAM

## 2021-01-19 ENCOUNTER — Other Ambulatory Visit: Payer: Self-pay

## 2021-01-19 MED ORDER — GLIPIZIDE 5 MG PO TABS: ORAL_TABLET | ORAL | 3 refills | Status: DC

## 2021-01-24 NOTE — Progress Notes (Signed)
Phone: [phone]   Subjective:  Patient presents today for their annual physical. Chief complaint-noted.   See problem oriented charting- ROS- full  review of systems was completed and negative  except for: dental problems- advised to see dentits, joint pain  The following were reviewed and entered/updated in epic: Past Medical History:  Diagnosis Date  . 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 Diabetes mellitus without complication ([place])   . Difficult intubation    "mouth was not wide enough"  . Dry eyes   . History of adenomatous polyp of colon 11/06/2016   10/2016 adenoma x3 - recall colon 10/2019  . Hyperlipidemia   . Hypertension   . Neuromuscular disorder ([place])    past hx tingling left arm   . Prostate enlargement 2010   See's Urologist    Patient Active Problem List   Diagnosis Date Noted  . Diabetes mellitus type II, controlled ([place]) 04/09/2013    Priority: High  . 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 Hyperlipidemia associated with type 2 diabetes mellitus ([place]) 07/09/2018    Priority: Medium  . Left cervical radiculopathy 09/24/2017    Priority: Medium  . Hx of colonic polyps 11/06/2016    Priority: Medium  . Chronic ankle pain 06/14/2016    Priority: Medium  . Thrombocytopenia ([place]) 11/29/2015    Priority: Medium  . Gout 04/30/2015    Priority: Medium  . Hypertension associated with diabetes ([place]) 04/09/2013    Priority: Medium  . BPH (benign prostatic hyperplasia) 04/09/2013    Priority: Medium  . Leg length difference, acquired 06/20/2019    Priority: Low  . Arthritis of first metatarsophalangeal (MTP) joint of right foot 05/30/2019    Priority: Low  . Sesamoiditis 11/08/2018    Priority: Low  . Osteoarthritis of spine with radiculopathy, cervical region 11/14/2017    Priority: Low  . Right knee pain 11/21/2016    Priority: Low  . Erectile dysfunction 02/04/2015     Priority: Low   Past Surgical History:  Procedure Laterality Date  . COLONOSCOPY    . COLONOSCOPY WITH PROPOFOL N/A 11/02/2016   Procedure: COLONOSCOPY WITH PROPOFOL;  Surgeon: Gatha Mayer, MD;  Location: [facility];  Service: Endoscopy;  Laterality: N/A;  . COLONOSCOPY WITH PROPOFOL N/A 05/18/2020   Procedure: COLONOSCOPY WITH PROPOFOL;  Surgeon: Gatha Mayer, MD;  Location: [facility];  Service: Endoscopy;  Laterality: N/A;  . LEG SURGERY Right 1997   accident related  . XI ROBOTIC ASSISTED SIMPLE PROSTATECTOMY N/A 10/05/2017   Procedure: XI ROBOTIC ASSISTED SIMPLE PROSTATECTOMY WITH UMBILICAL HERNIA REPAIR;  Surgeon: Cleon Gustin, MD;  Location: [facility];  Service: Urology;  Laterality: N/A;    Family History  Problem Relation Age of Onset  . Other Father        and mother- states died of old age. States no medical problems in parents or siblings  . Colon cancer Neg Hx   . Colon polyps Neg Hx   . Esophageal cancer Neg Hx   . Rectal cancer Neg Hx   . Stomach cancer Neg Hx     Medications- reviewed and updated Current Outpatient Medications  Medication Sig Dispense Refill  . ACCU-CHEK GUIDE test strip USE  STRIP TO CHECK GLUCOSE 4 TIMES DAILY AS  DIRECTED 100 each 0  . Accu-Chek Softclix Lancets lancets 4 (four) times daily.    Marland Kitchen acetaminophen (TYLENOL) 500 MG tablet Take 1,000 mg  by mouth every 6 (six) hours as needed for moderate pain.     Marland Kitchen allopurinol (ZYLOPRIM) 100 MG tablet Take 1 tablet by mouth once daily 30 tablet 0  . amLODipine (NORVASC) 10 MG tablet Take 1 tablet by mouth once daily 90 tablet 0  . atorvastatin (LIPITOR) 10 MG tablet Take 1 tablet (10 mg total) by mouth 2 (two) times a week. 26 tablet 3  . Camphor-Eucalyptus-Menthol (VICKS VAPORUB EX) Apply 1 application topically at bedtime as needed (pain/ congestions).    . colchicine (COLCRYS) 0.6 MG tablet TAKE 2 CAPSULES BY MOUTH AT THE FIRST SIGN OF GOUT THEN TAKE 1 CAPSULE 2 HOURS LATER THEN 1  CAPSULE DAILY UNTIL FLARE IS RESOLVED (Patient taking differently: Take 0.6-1.2 mg by mouth 2 (two) times daily as needed (gout flares.). TAKE 2 CAPSULES BY MOUTH AT THE FIRST SIGN OF GOUT THEN TAKE 1 CAPSULE 2 HOURS LATER THEN 1 CAPSULE DAILY UNTIL FLARE IS RESOLVED) 30 tablet 5  . diclofenac Sodium (VOLTAREN) 1 % GEL APPLY 2 GRAMS TOPICALLY TO AFFECTED AREAS 4 TIMES A DAY 100 g 0  . feeding supplement, ENSURE ENLIVE, (ENSURE ENLIVE) LIQD Take 1 Bottle by mouth daily.    Marland Kitchen gabapentin (NEURONTIN) 300 MG capsule Take 1 capsule (300 mg total) by mouth in the morning and at bedtime. 60 capsule 4  . glipiZIDE (GLUCOTROL) 5 MG tablet TAKE 1 TABLET BY MOUTH TWICE DAILY BEFORE MEAL(S) 180 tablet 3  . latanoprost (XALATAN) 0.005 % ophthalmic solution Place 1 drop into both eyes at bedtime.     . metFORMIN (GLUCOPHAGE) 1000 MG tablet TAKE 1 TABLET BY MOUTH TWICE DAILY WITH MEALS 180 tablet 0  . Polyethyl Glycol-Propyl Glycol (SYSTANE) 0.4-0.3 % SOLN Apply to eye. For dry eyes 3 x a day    . quinapril (ACCUPRIL) 20 MG tablet TAKE 1 TABLET BY MOUTH AT BEDTIME 90 tablet 0  . Tetrahyd-Glyc-Hypro-PEG-ZnSulf (VISINE TOTALITY MULTI-SYMPTOM OP) Apply 1 drop to eye in the morning, at noon, and at bedtime.     No current facility-administered medications for this visit.    Allergies-reviewed and updated No Known Allergies  Social History   Social History Narrative   Married. 4 children. 5 grandkids- new grandchild 10/2015 included.    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       Hobbies: enjoys reading novels. Enjoys African authors   Goes to [employer]   Objective  Objective:  BP 138/76 (BP Location: Left Arm, Patient Position: Sitting, Cuff Size: Normal)   Pulse 92   Temp 98.3 F (36.8 C) (Temporal)   Ht 5\' 7"  (1.702 m)   Wt 160 lb 12.8 oz (72.9 kg)   SpO2 94%   BMI 25.18 kg/m  Gen:  NAD, resting comfortably HEENT: Mucous membranes are moist. Oropharynx normal Neck: no thyromegaly CV: RRR no murmurs rubs or gallops Lungs: CTAB no crackles, wheeze, rhonchi Abdomen: soft/nontender/nondistended/normal bowel sounds. No rebound or guarding.  Ext: no edema Skin: warm, dry Neuro: grossly normal, moves all extremities, PERRLA Msk: has build up of 1-2 inches on right shoe due to leg length descrepancy    Diabetic Foot Exam - Simple   Simple Foot Form Diabetic Foot exam was performed with the following findings: Yes 01/26/2021  9:58 AM  Visual Inspection No deformities, no ulcerations, no other skin breakdown bilaterally: Yes Sensation Testing Intact  to touch and monofilament testing bilaterally: Yes Pulse Check Posterior Tibialis and Dorsalis pulse intact bilaterally: Yes Comments        Assessment and Plan  70 y.o. male presenting for annual physical.  Health Maintenance counseling: 1. Anticipatory guidance: Patient counseled regarding regular dental exams - advised q6 months (not set up) , eye exams -yearly,  avoiding smoking and second hand smoke , limiting alcohol to 2 beverages per day - doesn't drink.   2. Risk factor reduction:  Advised patient of need for regular exercise and diet rich and fruits and vegetables to reduce risk of heart attack and stroke. Exercise- not exercising- encouraged to start- discussed building up from even 5 minutes a day. Diet-reasonably healthy- weight stable.  Wt Readings from Last 3 Encounters:  01/26/21 160 lb 12.8 oz (72.9 kg)  07/27/20 162 lb 9.6 oz (73.8 kg)  06/09/20 161 lb (73 kg)  3. Immunizations/screenings/ancillary studies- up to date other than discussed weaker data on 2nd covid booster (would get at pharmacy) Immunization History  Administered Date(s) Administered  . Fluad Quad(high Dose 65+) 07/10/2019, 07/27/2020  . Influenza Split 07/31/2011  . Influenza, High Dose Seasonal PF 09/01/2017  . Influenza-Unspecified  07/16/2015, 08/14/2018  . Moderna Sars-Covid-2 Vaccination 10/27/2019, 11/24/2019, 09/16/2020  . Pneumococcal Conjugate-13 03/29/2017, 02/12/2018  . Pneumococcal Polysaccharide-23 07/31/2011, 07/10/2019, 02/24/2020  . Tdap 11/29/2015  . Zoster Recombinat (Shingrix) 02/12/2018, 05/01/2018  4. Prostate cancer screening- will trend PSA - trneding down after prostatectomy- will lean on urology after this year- may discontinue. Biopsy low risk in the past  Lab Results  Component Value Date   PSA 2.09 11/25/2019   PSA 4.35 08/21/2017   PSA 12.90 (H) 06/15/2014   5. Colon cancer screening - 05/18/20 with 5 year repeat due to poly history 6. Skin cancer screening- lower risk due to melanin content. advised regular sunscreen use. Denies worrisome, changing, or new skin lesions.  7. Never smoker 8. STD screening - only active with wife- not needed  Status of chronic or acute concerns   # Diabetes S: compliant with on Metformin 1000 mg BID and Glipizide 5 mg twice daily.   -gabapentin started for cervical radiculopathy- he prefers to continue as seems to help with leg pain (possible neuropathy) CBGs-  trending up- this AM was 178. Has been 150s to 170s in recent months. No low blood sugars Exercise and diet- not exercising- eating reasonably healthy though  A/P: sugars sound like they are trending up- update a1c. May need adjustment in medicine or another medicine altogether -could consider jardiance -continue gabapentin for potential neuropathy  #hypertension S: compliant with Amlodipine 10 mg once daily and Quinapril 20 mg daily.  BP Readings from Last 3 Encounters:  01/26/21 138/76  07/27/20 122/84  06/09/20 134/78  A/P: Stable. Continue current medications.   #hyperlipidemia S: compliant with Atorvastatin 10 mg once weekly.  Attempt increase to twice weekly September 2021- no myalgias on twice a week Lab Results  Component Value Date   CHOL 132 07/27/2020   HDL 42 07/27/2020    LDLCALC 73 07/27/2020   LDLDIRECT 90.0 02/26/2020   TRIG 89 07/27/2020   CHOLHDL 3.1 07/27/2020    A/P: update LDL today- hoping controlled  % BPH status post robotic prostatectomy December 2018- Follows with Dr. Alyson Ingles at [facility] urology.   For elevated PSA-biopsy in 2017 reassuring.psa trending down since prostatectomy- update today  # Gout S: compliant with Allopurinol 100 mg daily and Colchicine 0.6 mg prn.   Lab  Results  Component Value Date   LABURIC 4.5 11/20/2019  A/P: has been doing well and not needing colchicine- continue current meds most likely- update uric acid   # Thrombocytopenia/leukopenia S:platelets generally around 100. he states has been like this for over 2 years.  Intermittent leukopenia- typically get cbc with differential Lab Results  Component Value Date   WBC 4.0 07/27/2020   HGB 14.0 07/27/2020   HCT 45.6 07/27/2020   MCV 83.1 07/27/2020   PLT 95 (L) 07/27/2020   07/04/18 path smear review "Myeloid population consists predominantly of mature  segmented neutrophils. No immature cells are identified. RBCs appear to be microcytic and hypochromic on smear review. Suggest evaluation for iron deficiency, if clinically indicated. Thrombocytopenia with some large platelets seen. No platelet clumps identified. Reviewed by Francis Gaines Mammarappallil, MD "  Iron levels normal 07/10/2019 A/P: platelets low on last check but white blood count low normal- continue to monitor to make sure not worsening- if worsens may get hematology consult   #right hand pain- injection for ganglion. possible dupuytrens though june 2021 DR. COrey- starting to feel pain again- may need another eval- called to encourage him  # Right knee pain - using diclofenac topially for knee and working well  Recommended follow up: Return in about 4 months (around 05/28/2021) for follow up- or sooner if needed. with blood sugar running higher want to follow up closely.  Lab/Order associations:  fasting   ICD-10-CM   1. Preventative health care  Z00.00 Comprehensive metabolic panel    CBC with Differential/Platelet    Hemoglobin A1c    Uric acid    LDL cholesterol, direct    PSA  2. Hypertension associated with diabetes ([place])  E11.59 Comprehensive metabolic panel   O13.0 CBC with Differential/Platelet    LDL cholesterol, direct  3. Hyperlipidemia associated with type 2 diabetes mellitus (HCC)  E11.69 LDL cholesterol, direct   E78.5   4. Controlled type 2 diabetes mellitus without complication, without long-term current use of insulin (HCC)  E11.9 Comprehensive metabolic panel    CBC with Differential/Platelet    Hemoglobin A1c    LDL cholesterol, direct  5. Idiopathic chronic gout of multiple sites without tophus  M1A.86V7 Uric acid  6. Benign prostatic hyperplasia with lower urinary tract symptoms, symptom details unspecified  N40.1 PSA    No orders of the defined types were placed in this encounter.   Return precautions advised.  Garret Reddish, MD

## 2021-01-24 NOTE — Patient Instructions (Addendum)
Please stop by lab before you go If you have mychart- we will send your results within 3 business days of [place] receiving them.  If you do not have mychart- we will call you about results within 5 business days of [place] receiving them.  *please also note that you will see labs on mychart as soon as they post. I will later go in and write notes on them- will say "notes from Dr. Yong Channel"  I am a little worried about your sugars- we may add a medicine called jardiance if a1c is above 7  Recommended follow up: Return in about 4 months (around 05/28/2021) for follow up- or sooner if needed. with blood sugar running higher want to follow up closely.  Jardiance/Empagliflozin Oral Tablets What is this medicine? EMPAGLIFLOZIN (EM pa gli FLOE zin) helps to treat type 2 diabetes. It helps to control blood sugar. Treatment is combined with diet and exercise. This drug may also reduce the risk of heart attack, stroke, or death if you have type 2 diabetes and risk factors for heart disease. It also treats heart failure. It may lower the risk for treatment of heart failure in the hospital. This medicine may be used for other purposes; ask your health care provider or pharmacist if you have questions. COMMON BRAND NAME(S): Jardiance What should I tell my health care provider before I take this medicine? They need to know if you have any of these conditions:  dehydration  diabetic ketoacidosis  diet low in salt  eating less due to illness, surgery, dieting, or any other reason  having surgery  high cholesterol  high levels of potassium in the blood  history of pancreatitis or pancreas problems  history of yeast infection of the penis or vagina  if you often drink alcohol  infections in the bladder, kidneys, or urinary tract  kidney disease  liver disease  low blood pressure  on hemodialysis  problems urinating  type 1 diabetes  uncircumcised male  an unusual or allergic reaction to  empagliflozin, other medicines, foods, dyes, or preservatives  pregnant or trying to get pregnant  breast-feeding How should I use this medicine? Take this medicine by mouth with water. Take it as directed on the prescription label at the same time every day. You may take it with or without food. Keep taking it unless your health care provider tells you to stop. A special MedGuide will be given to you by the pharmacist with each prescription and refill. Be sure to read this information carefully each time. Talk to your health care provider about the use of this medicine in children. Special care may be needed. Overdosage: If you think you have taken too much of this medicine contact a poison control center or emergency room at once. NOTE: This medicine is only for you. Do not share this medicine with others. What if I miss a dose? If you miss a dose, take it as soon as you can. If it is almost time for your next dose, take only that dose. Do not take double or extra doses. What may interact with this medicine?  alcohol  diuretics  insulin This list may not describe all possible interactions. Give your health care provider a list of all the medicines, herbs, non-prescription drugs, or dietary supplements you use. Also tell them if you smoke, drink alcohol, or use illegal drugs. Some items may interact with your medicine. What should I watch for while using this medicine? Visit your health care  provider for regular checks on your progress. Tell your health care provider if your symptoms do not start to get better or if they get worse. This medicine can cause a serious condition in which there is too much acid in the blood. If you develop nausea, vomiting, stomach pain, unusual tiredness, or breathing problems, stop taking this medicine and call your doctor right away. If possible, use a ketone dipstick to check for ketones in your urine. Check with your health care provider if you have severe  diarrhea, nausea, and vomiting, or if you sweat a lot. The loss of too much body fluid may make it dangerous for you to take this medicine. A test called the HbA1C (A1C) will be monitored. This is a simple blood test. It measures your blood sugar control over the last 2 to 3 months. You will receive this test every 3 to 6 months. Learn how to check your blood sugar. Learn the symptoms of low and high blood sugar and how to manage them. Always carry a quick-source of sugar with you in case you have symptoms of low blood sugar. Examples include hard sugar candy or glucose tablets. Make sure others know that you can choke if you eat or drink when you develop serious symptoms of low blood sugar, such as seizures or unconsciousness. Get medical help at once. Tell your health care provider if you have high blood sugar. You might need to change the dose of your medicine. If you are sick or exercising more than usual, you may need to change the dose of your medicine. What side effects may I notice from receiving this medicine? Side effects that you should report to your doctor or health care professional as soon as possible:  allergic reactions (skin rash, itching or hives, swelling of the face, lips, or tongue)  breathing problems  dizziness  feeling faint or lightheaded, falls  genital infection (fever; tenderness, redness, or swelling in the genitals or area from the genitals to the back of the rectum)  kidney injury (trouble passing urine or change in the amount of urine)  low blood sugar (feeling anxious; confusion; dizziness; increased hunger; unusually weak or tired; increased sweating; shakiness; cold, clammy skin; irritable; headache; blurred vision; fast heartbeat; loss of consciousness)  muscle weakness  nausea, vomiting, unusual stomach upset or pain  new pain or tenderness, change in skin color, sores or ulcers, or infection in legs or feet  penile discharge, itching, or  pain  unusual tiredness  unusual vaginal discharge, itching, or odor  urinary tract infection (fever; chills; a burning feeling when urinating; urgent need to urinate more often; blood in the urine; back pain) Side effects that usually do not require medical attention (report to your doctor or health care professional if they continue or are bothersome):  mild increase in urination  thirsty This list may not describe all possible side effects. Call your doctor for medical advice about side effects. You may report side effects to FDA at 1-800-FDA-1088. Where should I keep my medicine? Keep out of the reach of children and pets. Store at room temperature between 20 and 25 degrees C (68 and 77 degrees F). Get rid of any unused medicine after the expiration date. To get rid of medicines that are no longer needed or have expired:  Take the medicine to a medicine take-back program. Check with your pharmacy or law enforcement to find a location.  If you cannot return the medicine, check the label or package  insert to see if the medicine should be thrown out in the garbage or flushed down the toilet. If you are not sure, ask your health care provider. If it is safe to put it in the trash, take the medicine out of the container. Mix the medicine with cat litter, dirt, coffee grounds, or other unwanted substance. Seal the mixture in a bag or container. Put it in the trash. NOTE: This sheet is a summary. It may not cover all possible information. If you have questions about this medicine, talk to your doctor, pharmacist, or health care provider.  2021 Elsevier/Gold Standard (2020-06-18 19:51:17)

## 2021-01-26 ENCOUNTER — Ambulatory Visit (INDEPENDENT_AMBULATORY_CARE_PROVIDER_SITE_OTHER): Payer: Medicare HMO | Admitting: Family Medicine

## 2021-01-26 ENCOUNTER — Encounter: Payer: Self-pay | Admitting: Family Medicine

## 2021-01-26 ENCOUNTER — Other Ambulatory Visit: Payer: Self-pay

## 2021-01-26 VITALS — BP 138/76 | HR 92 | Temp 98.3°F | Ht 67.0 in | Wt 160.8 lb

## 2021-01-26 DIAGNOSIS — N401 Enlarged prostate with lower urinary tract symptoms: Secondary | ICD-10-CM | POA: Diagnosis not present

## 2021-01-26 DIAGNOSIS — M1A09X0 Idiopathic chronic gout, multiple sites, without tophus (tophi): Secondary | ICD-10-CM | POA: Diagnosis not present

## 2021-01-26 DIAGNOSIS — I152 Hypertension secondary to endocrine disorders: Secondary | ICD-10-CM

## 2021-01-26 DIAGNOSIS — E1159 Type 2 diabetes mellitus with other circulatory complications: Secondary | ICD-10-CM | POA: Diagnosis not present

## 2021-01-26 DIAGNOSIS — Z0000 Encounter for general adult medical examination without abnormal findings: Secondary | ICD-10-CM | POA: Diagnosis not present

## 2021-01-26 DIAGNOSIS — E1169 Type 2 diabetes mellitus with other specified complication: Secondary | ICD-10-CM

## 2021-01-26 DIAGNOSIS — E785 Hyperlipidemia, unspecified: Secondary | ICD-10-CM | POA: Diagnosis not present

## 2021-01-26 DIAGNOSIS — E119 Type 2 diabetes mellitus without complications: Secondary | ICD-10-CM | POA: Diagnosis not present

## 2021-01-26 DIAGNOSIS — D696 Thrombocytopenia, unspecified: Secondary | ICD-10-CM

## 2021-01-26 LAB — LDL CHOLESTEROL, DIRECT: Direct LDL: 82 mg/dL

## 2021-01-26 LAB — CBC WITH DIFFERENTIAL/PLATELET
Basophils Absolute: 0 10*3/uL (ref 0.0–0.1)
Basophils Relative: 0.6 % (ref 0.0–3.0)
Eosinophils Absolute: 0.3 10*3/uL (ref 0.0–0.7)
Eosinophils Relative: 6.6 % — ABNORMAL HIGH (ref 0.0–5.0)
HCT: 43.6 % (ref 39.0–52.0)
Hemoglobin: 14 g/dL (ref 13.0–17.0)
Lymphocytes Relative: 42.4 % (ref 12.0–46.0)
Lymphs Abs: 2 10*3/uL (ref 0.7–4.0)
MCHC: 32 g/dL (ref 30.0–36.0)
MCV: 81.5 fl (ref 78.0–100.0)
Monocytes Absolute: 0.3 10*3/uL (ref 0.1–1.0)
Monocytes Relative: 6.8 % (ref 3.0–12.0)
Neutro Abs: 2 10*3/uL (ref 1.4–7.7)
Neutrophils Relative %: 43.6 % (ref 43.0–77.0)
Platelets: 88 10*3/uL — ABNORMAL LOW (ref 150.0–400.0)
RBC: 5.35 Mil/uL (ref 4.22–5.81)
RDW: 13.7 % (ref 11.5–15.5)
WBC: 4.7 10*3/uL (ref 4.0–10.5)

## 2021-01-26 LAB — COMPREHENSIVE METABOLIC PANEL
ALT: 16 U/L (ref 0–53)
AST: 15 U/L (ref 0–37)
Albumin: 4.1 g/dL (ref 3.5–5.2)
Alkaline Phosphatase: 69 U/L (ref 39–117)
BUN: 18 mg/dL (ref 6–23)
CO2: 26 mEq/L (ref 19–32)
Calcium: 9.5 mg/dL (ref 8.4–10.5)
Chloride: 101 mEq/L (ref 96–112)
Creatinine, Ser: 0.91 mg/dL (ref 0.40–1.50)
GFR: 85.4 mL/min (ref >60.00)
Glucose, Bld: 300 mg/dL — ABNORMAL HIGH (ref 70–99)
Potassium: 4.3 mEq/L (ref 3.5–5.1)
Sodium: 137 mEq/L (ref 135–145)
Total Bilirubin: 0.4 mg/dL (ref 0.2–1.2)
Total Protein: 6.6 g/dL (ref 6.0–8.3)

## 2021-01-26 LAB — HEMOGLOBIN A1C: Hgb A1c MFr Bld: 7.1 % — ABNORMAL HIGH (ref 4.6–6.5)

## 2021-01-26 LAB — URIC ACID: Uric Acid, Serum: 5.1 mg/dL (ref 4.0–7.8)

## 2021-01-26 LAB — PSA: PSA: 1.51 ng/mL (ref 0.10–4.00)

## 2021-02-02 ENCOUNTER — Telehealth: Payer: Self-pay

## 2021-02-02 MED ORDER — EMPAGLIFLOZIN 10 MG PO TABS: 10.0000 mg | ORAL_TABLET | Freq: Every day | ORAL | 3 refills | Status: DC

## 2021-02-02 NOTE — Telephone Encounter (Signed)
Pt called stating he saw Dr. Yong Channel on 3/30. Pt states they discussed pt starting jardiance, but the pt was never prescribed it. Pt is wanting to follow up about this. Please advise.

## 2021-02-02 NOTE — Telephone Encounter (Signed)
Called and spoke with pt and made aware that Jardiance sent in.

## 2021-02-15 ENCOUNTER — Other Ambulatory Visit: Payer: Self-pay | Admitting: Family Medicine

## 2021-02-17 ENCOUNTER — Telehealth: Payer: Self-pay

## 2021-02-17 NOTE — Telephone Encounter (Signed)
Sometimes they will cover mitigare or colcrys- different brand- can we check with pharmacy on those- same signature as colchicine

## 2021-02-17 NOTE — Telephone Encounter (Signed)
Pharmacy faxed over a Alternate Therapy request for pts Colchicine due to insurance not covering it, I called and spoke with pharmacy to clarify and they state insurance did not list any alternates.

## 2021-02-18 ENCOUNTER — Other Ambulatory Visit: Payer: Self-pay

## 2021-02-18 MED ORDER — COLCHICINE 0.6 MG PO CAPS: ORAL_CAPSULE | ORAL | 1 refills | Status: DC

## 2021-02-18 NOTE — Telephone Encounter (Signed)
Called and spoke with pharmacy and pharmacist state mitigare is covered, rx sent in.

## 2021-02-21 ENCOUNTER — Other Ambulatory Visit: Payer: Self-pay | Admitting: Family Medicine

## 2021-02-25 ENCOUNTER — Other Ambulatory Visit: Payer: Self-pay | Admitting: Family Medicine

## 2021-03-10 DIAGNOSIS — H1132 Conjunctival hemorrhage, left eye: Secondary | ICD-10-CM | POA: Diagnosis not present

## 2021-03-16 ENCOUNTER — Other Ambulatory Visit: Payer: Self-pay | Admitting: Family Medicine

## 2021-03-17 DIAGNOSIS — H1132 Conjunctival hemorrhage, left eye: Secondary | ICD-10-CM | POA: Diagnosis not present

## 2021-03-17 DIAGNOSIS — Z23 Encounter for immunization: Secondary | ICD-10-CM | POA: Diagnosis not present

## 2021-03-22 ENCOUNTER — Other Ambulatory Visit: Payer: Self-pay | Admitting: Family Medicine

## 2021-04-08 ENCOUNTER — Ambulatory Visit (INDEPENDENT_AMBULATORY_CARE_PROVIDER_SITE_OTHER): Payer: Medicare HMO

## 2021-04-08 ENCOUNTER — Other Ambulatory Visit: Payer: Self-pay

## 2021-04-08 DIAGNOSIS — Z0000 Encounter for general adult medical examination without abnormal findings: Secondary | ICD-10-CM

## 2021-04-08 NOTE — Progress Notes (Signed)
Virtual Visit via Telephone Note  I connected with  Preston Pennington on 04/08/21 at 11:00 AM EDT by telephone and verified that I am speaking with the correct person using two identifiers.  Medicare Annual Wellness visit completed telephonically due to Covid-19 pandemic.   Persons participating in this call: This Health Coach and this patient.   Location: Patient: Home Provider: Office   I discussed the limitations, risks, security and privacy concerns of performing an evaluation and management service by telephone and the availability of in person appointments. The patient expressed understanding and agreed to proceed.  Unable to perform video visit due to video visit attempted and failed and/or patient does not have video capability.   Some vital signs may be absent or patient reported.   Willette Brace, LPN   Subjective:   Preston Pennington is a 70 y.o. male who presents for Medicare Annual/Subsequent preventive examination.  Review of Systems     Cardiac Risk Factors include: advanced age (>83men, >49 women);diabetes mellitus;male gender;hypertension;dyslipidemia     Objective:    Today's Vitals   04/08/21 1102  PainSc: 7    There is no height or weight on file to calculate BMI.  Advanced Directives 04/08/2021 05/18/2020 12/25/2017 10/05/2017 10/03/2017 10/01/2017 03/07/2017  Does Patient Have a Medical Advance Directive? No No No Yes Yes Yes No  Type of Advance Directive - - - Living will Living will Living will -  Does patient want to make changes to medical advance directive? - - - No - Patient declined No - Patient declined - -  Would patient like information on creating a medical advance directive? Yes (MAU/Ambulatory/Procedural Areas - Information given) No - Patient declined No - Patient declined - - - -    Current Medications (verified) Outpatient Encounter Medications as of 04/08/2021  Medication Sig   ACCU-CHEK GUIDE test strip USE TO CHECK BLOOD GLUCOSE LEVELS 4 TIMES  DAILY AS DIRECTED   Accu-Chek Softclix Lancets lancets 4 (four) times daily.   acetaminophen (TYLENOL) 500 MG tablet Take 1,000 mg by mouth every 6 (six) hours as needed for moderate pain.    allopurinol (ZYLOPRIM) 100 MG tablet Take 1 tablet by mouth once daily   amLODipine (NORVASC) 10 MG tablet Take 1 tablet by mouth once daily   atorvastatin (LIPITOR) 10 MG tablet Take 1 tablet (10 mg total) by mouth 2 (two) times a week.   Camphor-Eucalyptus-Menthol (VICKS VAPORUB EX) Apply 1 application topically at bedtime as needed (pain/ congestions).   Colchicine (MITIGARE) 0.6 MG CAPS TAKE 2 TABLETS BY MOUTH AT THE FIRST SIGN OF GOUT THEN TAKE 1 TABLET 2 HOURS LATER THEN 1 TABLET ONCE DAILY UNTIL FLARE IS RESOLVED   colchicine 0.6 MG tablet TAKE 2 TABLETS BY MOUTH AT THE FIRST SIGN OF GOUT THEN TAKE 1 TABLET 2 HOURS LATER THEN 1 TABLET ONCE DAILY UNTIL FLARE IS RESOLVED   diclofenac Sodium (VOLTAREN) 1 % GEL APPLY 2 GRAMS TOPICALLY  TO AFFECTED AREA 4 TIMES DAILY   empagliflozin (JARDIANCE) 10 MG TABS tablet Take 1 tablet (10 mg total) by mouth daily before breakfast.   feeding supplement, ENSURE ENLIVE, (ENSURE ENLIVE) LIQD Take 1 Bottle by mouth daily.   gabapentin (NEURONTIN) 300 MG capsule TAKE 1 CAPSULE BY MOUTH IN THE MORNING AND AT BEDTIME   glipiZIDE (GLUCOTROL) 5 MG tablet TAKE 1 TABLET BY MOUTH TWICE DAILY BEFORE MEAL(S)   latanoprost (XALATAN) 0.005 % ophthalmic solution Place 1 drop into both eyes at bedtime.  Polyethyl Glycol-Propyl Glycol (SYSTANE) 0.4-0.3 % SOLN Apply to eye. For dry eyes 3 x a day   quinapril (ACCUPRIL) 20 MG tablet TAKE 1 TABLET BY MOUTH AT BEDTIME   Tetrahyd-Glyc-Hypro-PEG-ZnSulf (VISINE TOTALITY MULTI-SYMPTOM OP) Apply 1 drop to eye in the morning, at noon, and at bedtime.   [DISCONTINUED] metFORMIN (GLUCOPHAGE) 1000 MG tablet TAKE 1 TABLET BY MOUTH TWICE DAILY WITH MEALS (Patient not taking: Reported on 04/08/2021)   [DISCONTINUED] tamsulosin (FLOMAX) 0.4 MG CAPS  capsule Take 1 capsule by mouth daily. (Patient not taking: Reported on 04/08/2021)   No facility-administered encounter medications on file as of 04/08/2021.    Allergies (verified) Patient has no known allergies.   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   Diabetes mellitus without complication ([place])    Difficult intubation    "mouth was not wide enough"   Dry eyes    History of adenomatous polyp of colon 11/06/2016   10/2016 adenoma x3 - recall colon 10/2019   Hyperlipidemia    Hypertension    Neuromuscular disorder ([place])    past hx tingling left arm    Prostate enlargement 2010   See's Urologist    Past Surgical History:  Procedure Laterality Date   COLONOSCOPY     COLONOSCOPY WITH PROPOFOL N/A 11/02/2016   Procedure: COLONOSCOPY WITH PROPOFOL;  Surgeon: Gatha Mayer, MD;  Location: [facility];  Service: Endoscopy;  Laterality: N/A;   COLONOSCOPY WITH PROPOFOL N/A 05/18/2020   Procedure: COLONOSCOPY WITH PROPOFOL;  Surgeon: Gatha Mayer, MD;  Location: [facility];  Service: Endoscopy;  Laterality: N/A;   LEG SURGERY Right 1997   accident related   XI ROBOTIC ASSISTED SIMPLE PROSTATECTOMY N/A 10/05/2017   Procedure: XI ROBOTIC ASSISTED SIMPLE PROSTATECTOMY WITH UMBILICAL HERNIA REPAIR;  Surgeon: Cleon Gustin, MD;  Location: [facility];  Service: Urology;  Laterality: N/A;   Family History  Problem Relation Age of Onset   Other Father        and mother- states died of old age. States no medical problems in parents or siblings   Colon cancer Neg Hx    Colon polyps Neg Hx    Esophageal cancer Neg Hx    Rectal cancer Neg Hx    Stomach cancer Neg Hx    Social History   Socioeconomic History   Marital status: Married    Spouse name: [name]   Number of children: 4   Years of education: Not on file   Highest education level: Not on file  Occupational History   Not on file  Tobacco Use    Smoking status: Never   Smokeless tobacco: Never  Vaping Use   Vaping Use: Never used  Substance and Sexual Activity   Alcohol use: No    Alcohol/week: 0.0 standard drinks   Drug use: No   Sexual activity: Yes  Other Topics Concern   Not on file  Social History Narrative   Married. 4 children. 5 grandkids- new grandchild 10/2015 included.    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       Hobbies: enjoys reading novels. Enjoys African authors   Goes to [employer] ([place])   Social Determinants of Health   Financial Resource Strain: Low Risk    Difficulty of Paying Living Expenses: Not hard at all  Food Insecurity: No Food Insecurity   Worried About Charity fundraiser in the Last Year: Never true   Ran Out of Food in the Last Year: Never true  Transportation Needs: No Transportation Needs   Lack of Transportation (Medical): No   Lack of Transportation (Non-Medical): No  Physical Activity: Inactive   Days of Exercise per Week: 0 days   Minutes of Exercise per Session: 0 min  Stress: No Stress Concern Present   Feeling of Stress : Not at all  Social Connections: Socially Integrated   Frequency of Communication with Friends and Family: More than three times a week   Frequency of Social Gatherings with Friends and Family: More than three times a week   Attends Religious Services: More than 4 times per year   Active Member of [employer] or Organizations: Yes   Attends Archivist Meetings: 1 to 4 times per year   Marital Status: Married    Tobacco Counseling Counseling given: Not Answered   Clinical Intake:  Pre-visit preparation completed: Yes  Pain : 0-10 Pain Score: 7  Pain Type: Chronic pain Pain Location: Leg Pain Descriptors / Indicators: Aching Pain Onset: More than a month ago Pain Frequency: Intermittent     BMI - recorded: 25.18 Nutritional  Status: BMI 25 -29 Overweight Nutritional Risks: None Diabetes: Yes CBG done?: Yes (172) CBG resulted in Enter/ Edit results?: No Did pt. bring in CBG monitor from home?: No  How often do you need to have someone help you when you read instructions, pamphlets, or other written materials from your doctor or pharmacy?: 1 - Never  Diabetic?Nutrition Risk Assessment:  Has the patient had any N/V/D within the last 2 months?  No  Does the patient have any non-healing wounds?  No  Has the patient had any unintentional weight loss or weight gain?  No   Diabetes:  Is the patient diabetic?  Yes  If diabetic, was a CBG obtained today?  Yes  Did the patient bring in their glucometer from home?  No  How often do you monitor your CBG's? Daily .   Financial Strains and Diabetes Management:  Are you having any financial strains with the device, your supplies or your medication? No .  Does the patient want to be seen by Chronic Care Management for management of their diabetes?  No  Would the patient like to be referred to a Nutritionist or for Diabetic Management?  No   Diabetic Exams:  Diabetic Eye Exam: Completed 01/24/21 Diabetic Foot Exam: Completed 01/26/21   Interpreter Needed?: No  Information entered by :: Charlott Rakes, LPN   Activities of Daily Living In your present state of health, do you have any difficulty performing the following activities: 04/08/2021 01/26/2021  Hearing? Y N  Comment states mild loss -  Vision? N N  Difficulty concentrating or making decisions? N N  Walking or climbing stairs? N N  Dressing or bathing? N N  Doing errands, shopping? N N  Preparing Food and eating ? N -  Using the Toilet? N -  In the past six months, have you accidently leaked urine? N -  Do you have problems with loss of bowel control? N -  Managing your Medications? N -  Managing your Finances? N -  Housekeeping or managing your Housekeeping? N -  Some recent data might be hidden     Patient Care Team: Marin Olp, MD as PCP - General (Family Medicine) Jola Schmidt, MD  as Consulting Physician (Ophthalmology)  Indicate any recent Medical Services you may have received from other than [facility] providers in the past year (date may be approximate).     Assessment:   This is a routine wellness examination for Fraser.  Hearing/Vision screen Hearing Screening - Comments:: Pt stated mild loss Vision Screening - Comments:: Pt follows up with Dr Valetta Close for annual eye exams   Dietary issues and exercise activities discussed: Current Exercise Habits: The patient does not participate in regular exercise at present   Goals Addressed             This Visit's Progress    Patient Stated       None at this time         Depression Screen PHQ 2/9 Scores 04/08/2021 06/09/2020 04/09/2020 01/26/2020 07/10/2019 11/08/2018 07/09/2018  PHQ - 2 Score 0 0 0 0 0 0 0    Fall Risk Fall Risk  04/08/2021 01/26/2021 01/26/2020 07/10/2019 07/09/2018  Falls in the past year? 0 0 0 0 No  Number falls in past yr: 0 0 0 0 -  Injury with Fall? 0 0 - 0 -  Risk for fall due to : Impaired vision - - - -  Follow up Falls prevention discussed - - - -    FALL RISK PREVENTION PERTAINING TO THE HOME:  Any stairs in or around the home? Yes  If so, are there any without handrails? No  Home free of loose throw rugs in walkways, pet beds, electrical cords, etc? Yes  Adequate lighting in your home to reduce risk of falls? Yes   ASSISTIVE DEVICES UTILIZED TO PREVENT FALLS:  Life alert? No  Use of a cane, walker or w/c? No  Grab bars in the bathroom? Yes  Shower chair or bench in shower? No  Elevated toilet seat or a handicapped toilet? No   TIMED UP AND GO:  Was the test performed? No     Cognitive Function:     6CIT Screen 04/08/2021  What Year? 0 points  What month? 0 points  What time? 0 points  Count back from 20 0 points  Months in reverse 0 points  Repeat phrase 0 points   Total Score 0    Immunizations Immunization History  Administered Date(s) Administered   Fluad Quad(high Dose 65+) 07/10/2019, 07/27/2020   Hepatitis B 02/27/2021, 03/30/2021   Influenza Split 07/31/2011   Influenza, High Dose Seasonal PF 09/01/2017   Influenza-Unspecified 07/16/2015, 08/14/2018   Moderna Sars-Covid-2 Vaccination 10/27/2019, 11/24/2019, 09/16/2020   Pneumococcal Conjugate-13 03/29/2017, 02/12/2018   Pneumococcal Polysaccharide-23 07/31/2011, 07/10/2019, 02/24/2020   Tdap 11/29/2015   Zoster Recombinat (Shingrix) 02/12/2018, 05/01/2018    TDAP status: Up to date  Flu Vaccine status: Up to date  Pneumococcal vaccine status: Up to date  Covid-19 vaccine status: Completed vaccines  Qualifies for Shingles Vaccine? Yes   Zostavax completed Yes   Shingrix Completed?: Yes  Screening Tests Health Maintenance  Topic Date Due   COVID-19 Vaccine (4 - Booster for Moderna series) 01/14/2021   INFLUENZA VACCINE  05/30/2021   HEMOGLOBIN A1C  07/29/2021   OPHTHALMOLOGY EXAM  01/24/2022   FOOT EXAM  01/26/2022   COLONOSCOPY (Pts 45-78yrs Insurance coverage will need to be confirmed)  05/18/2025   TETANUS/TDAP  11/28/2025   PNA vac Low Risk Adult  Completed   Zoster Vaccines- Shingrix  Completed   Hepatitis C Screening  Addressed   HPV VACCINES  Aged Out    Health Maintenance  Health Maintenance Due  Topic Date Due   COVID-19 Vaccine (4 - Booster for Moderna series) 01/14/2021    Colorectal cancer screening: Type of screening: Colonoscopy. Completed 05/18/20. Repeat every 5 years   Additional Screening:  Hepatitis C Screening:  Completed 11/29/15  Vision Screening: Recommended annual ophthalmology exams for early detection of glaucoma and other disorders of the eye. Is the patient up to date with their annual eye exam?  Yes  Who is the provider or what is the name of the office in which the patient attends annual eye exams?  [facility] If pt is not established  with a provider, would they like to be referred to a provider to establish care? No .   Dental Screening: Recommended annual dental exams for proper oral hygiene  Community Resource Referral / Chronic Care Management: CRR required this visit?  No   CCM required this visit?  No      Plan:     I have personally reviewed and noted the following in the patient's chart:   Medical and social history Use of alcohol, tobacco or illicit drugs  Current medications and supplements including opioid prescriptions. Patient is not currently taking opioid prescriptions. Functional ability and status Nutritional status Physical activity Advanced directives List of other physicians Hospitalizations, surgeries, and ER visits in previous 12 months Vitals Screenings to include cognitive, depression, and falls Referrals and appointments  In addition, I have reviewed and discussed with patient certain preventive protocols, quality metrics, and best practice recommendations. A written personalized care plan for preventive services as well as general preventive health recommendations were provided to patient.     Willette Brace, LPN   [id number]   Nurse Notes: Pt stated that the Vania Rea is going to be too expensive to manage long term when he goes to [place] within the next year, Pt stated that he hasn't seen any changes in the blood sugar since starting the Jardiance and wants to discuss at appt or sooner.

## 2021-04-08 NOTE — Patient Instructions (Signed)
Preston Pennington , Thank you for taking time to come for your Medicare Wellness Visit. I appreciate your ongoing commitment to your health goals. Please review the following plan we discussed and let me know if I can assist you in the future.   Screening recommendations/referrals: Colonoscopy: Done 05/18/20 repeat 05/18/25 Recommended yearly ophthalmology/optometry visit for glaucoma screening and checkup Recommended yearly dental visit for hygiene and checkup  Vaccinations: Influenza vaccine: Up to date/ due 8/22 Pneumococcal vaccine: Up to date/ completed  Tdap vaccine: Up to date next due 11/28/25 Shingles vaccine: Completed 02/12/18 & 05/01/18   Covid-19: Completed 10/27/19, 11/24/19, & 09/16/20  Advanced directives: Advance directive discussed with you today. I have provided a copy for you to complete at home and have notarized. Once this is complete please bring a copy in to our office so we can scan it into your chart.  Conditions/risks identified: none at this time  Next appointment: Follow up in one year for your annual wellness visit.   Preventive Care 48 Years and Older, Male Preventive care refers to lifestyle choices and visits with your health care provider that can promote health and wellness. What does preventive care include? A yearly physical exam. This is also called an annual well check. Dental exams once or twice a year. Routine eye exams. Ask your health care provider how often you should have your eyes checked. Personal lifestyle choices, including: Daily care of your teeth and gums. Regular physical activity. Eating a healthy diet. Avoiding tobacco and drug use. Limiting alcohol use. Practicing safe sex. Taking low doses of aspirin every day. Taking vitamin and mineral supplements as recommended by your health care provider. What happens during an annual well check? The services and screenings done by your health care provider during your annual well check will depend  on your age, overall health, lifestyle risk factors, and family history of disease. Counseling  Your health care provider may ask you questions about your: Alcohol use. Tobacco use. Drug use. Emotional well-being. Home and relationship well-being. Sexual activity. Eating habits. History of falls. Memory and ability to understand (cognition). Work and work Statistician. Screening  You may have the following tests or measurements: Height, weight, and BMI. Blood pressure. Lipid and cholesterol levels. These may be checked every 5 years, or more frequently if you are over 85 years old. Skin check. Lung cancer screening. You may have this screening every year starting at age 69 if you have a 30-pack-year history of smoking and currently smoke or have quit within the past 15 years. Fecal occult blood test (FOBT) of the stool. You may have this test every year starting at age 67. Flexible sigmoidoscopy or colonoscopy. You may have a sigmoidoscopy every 5 years or a colonoscopy every 10 years starting at age 64. Prostate cancer screening. Recommendations will vary depending on your family history and other risks. Hepatitis C blood test. Hepatitis B blood test. Sexually transmitted disease (STD) testing. Diabetes screening. This is done by checking your blood sugar (glucose) after you have not eaten for a while (fasting). You may have this done every 1-3 years. Abdominal aortic aneurysm (AAA) screening. You may need this if you are a current or former smoker. Osteoporosis. You may be screened starting at age 34 if you are at high risk. Talk with your health care provider about your test results, treatment options, and if necessary, the need for more tests. Vaccines  Your health care provider may recommend certain vaccines, such as: Influenza vaccine. This  is recommended every year. Tetanus, diphtheria, and acellular pertussis (Tdap, Td) vaccine. You may need a Td booster every 10  years. Zoster vaccine. You may need this after age 74. Pneumococcal 13-valent conjugate (PCV13) vaccine. One dose is recommended after age 77. Pneumococcal polysaccharide (PPSV23) vaccine. One dose is recommended after age 34. Talk to your health care provider about which screenings and vaccines you need and how often you need them. This information is not intended to replace advice given to you by your health care provider. Make sure you discuss any questions you have with your health care provider. Document Released: 11/12/2015 Document Revised: 07/05/2016 Document Reviewed: 08/17/2015 Elsevier Interactive Patient Education  2017 [place] Prevention in the Home Falls can cause injuries. They can happen to people of all ages. There are many things you can do to make your home safe and to help prevent falls. What can I do on the outside of my home? Regularly fix the edges of walkways and driveways and fix any cracks. Remove anything that might make you trip as you walk through a door, such as a raised step or threshold. Trim any bushes or trees on the path to your home. Use bright outdoor lighting. Clear any walking paths of anything that might make someone trip, such as rocks or tools. Regularly check to see if handrails are loose or broken. Make sure that both sides of any steps have handrails. Any raised decks and porches should have guardrails on the edges. Have any leaves, snow, or ice cleared regularly. Use sand or salt on walking paths during winter. Clean up any spills in your garage right away. This includes oil or grease spills. What can I do in the bathroom? Use night lights. Install grab bars by the toilet and in the tub and shower. Do not use towel bars as grab bars. Use non-skid mats or decals in the tub or shower. If you need to sit down in the shower, use a plastic, non-slip stool. Keep the floor dry. Clean up any water that spills on the floor as soon as it  happens. Remove soap buildup in the tub or shower regularly. Attach bath mats securely with double-sided non-slip rug tape. Do not have throw rugs and other things on the floor that can make you trip. What can I do in the bedroom? Use night lights. Make sure that you have a light by your bed that is easy to reach. Do not use any sheets or blankets that are too big for your bed. They should not hang down onto the floor. Have a firm chair that has side arms. You can use this for support while you get dressed. Do not have throw rugs and other things on the floor that can make you trip. What can I do in the kitchen? Clean up any spills right away. Avoid walking on wet floors. Keep items that you use a lot in easy-to-reach places. If you need to reach something above you, use a strong step stool that has a grab bar. Keep electrical cords out of the way. Do not use floor polish or wax that makes floors slippery. If you must use wax, use non-skid floor wax. Do not have throw rugs and other things on the floor that can make you trip. What can I do with my stairs? Do not leave any items on the stairs. Make sure that there are handrails on both sides of the stairs and use them. Fix handrails that  are broken or loose. Make sure that handrails are as long as the stairways. Check any carpeting to make sure that it is firmly attached to the stairs. Fix any carpet that is loose or worn. Avoid having throw rugs at the top or bottom of the stairs. If you do have throw rugs, attach them to the floor with carpet tape. Make sure that you have a light switch at the top of the stairs and the bottom of the stairs. If you do not have them, ask someone to add them for you. What else can I do to help prevent falls? Wear shoes that: Do not have high heels. Have rubber bottoms. Are comfortable and fit you well. Are closed at the toe. Do not wear sandals. If you use a stepladder: Make sure that it is fully opened.  Do not climb a closed stepladder. Make sure that both sides of the stepladder are locked into place. Ask someone to hold it for you, if possible. Clearly mark and make sure that you can see: Any grab bars or handrails. First and last steps. Where the edge of each step is. Use tools that help you move around (mobility aids) if they are needed. These include: Canes. Walkers. Scooters. Crutches. Turn on the lights when you go into a dark area. Replace any light bulbs as soon as they burn out. Set up your furniture so you have a clear path. Avoid moving your furniture around. If any of your floors are uneven, fix them. If there are any pets around you, be aware of where they are. Review your medicines with your doctor. Some medicines can make you feel dizzy. This can increase your chance of falling. Ask your doctor what other things that you can do to help prevent falls. This information is not intended to replace advice given to you by your health care provider. Make sure you discuss any questions you have with your health care provider. Document Released: 08/12/2009 Document Revised: 03/23/2016 Document Reviewed: 11/20/2014 Elsevier Interactive Patient Education  2017 [employer].

## 2021-04-13 ENCOUNTER — Other Ambulatory Visit: Payer: Self-pay | Admitting: Family Medicine

## 2021-04-15 ENCOUNTER — Other Ambulatory Visit: Payer: Self-pay | Admitting: Family Medicine

## 2021-04-21 ENCOUNTER — Other Ambulatory Visit: Payer: Self-pay | Admitting: Family Medicine

## 2021-04-29 ENCOUNTER — Telehealth: Payer: Self-pay

## 2021-04-29 DIAGNOSIS — E119 Type 2 diabetes mellitus without complications: Secondary | ICD-10-CM

## 2021-04-29 NOTE — Telephone Encounter (Signed)
Patient called in stating that his prescription,empagliflozin (JARDIANCE) 10 MG TABS tablet was too expensive, and is wanting to know what other medications would be a cheaper option. Rudi has a 5 day supply left.

## 2021-05-03 NOTE — Telephone Encounter (Signed)
Dr. Yong Channel, pt says Vania Rea is too expensive asking for something else. Please advise.

## 2021-05-04 NOTE — Telephone Encounter (Signed)
Can we check with patient's pharmacy about alternate SGLT2 inhibitors and see which is less expensive or have him call?  I think this is a good class of medicine for him and would prefer to stay on it if we can find a cheaper option.  Is patient eligible for chronic care management with [facility]?  Perhaps he could help [place] with finding more affordable alternatives if we can enroll him

## 2021-05-05 NOTE — Telephone Encounter (Signed)
Contacted pharmacy and they did not know of any cheaper options. I called and spoke with pt and advised pt to call and check with his insurance company and give [place] a call when he had a few cheaper options for [place] to send in.

## 2021-05-05 NOTE — Telephone Encounter (Signed)
I have referred pt to [facility], will call pharmacy after 8 am to see other options.

## 2021-05-05 NOTE — Telephone Encounter (Signed)
Patient returned call regarding this please advise patient

## 2021-05-06 MED ORDER — EMPAGLIFLOZIN 10 MG PO TABS: 10.0000 mg | ORAL_TABLET | Freq: Every day | ORAL | 3 refills | Status: DC

## 2021-05-06 NOTE — Telephone Encounter (Signed)
Patient called and stated that he would like to stay on Jardiance. He also requested a refill of the medication if possible.

## 2021-05-06 NOTE — Telephone Encounter (Signed)
Jardiance refilled.

## 2021-05-06 NOTE — Addendum Note (Signed)
Addended by: Clyde Lundborg A on: 05/06/2021 10:01 AM   Modules accepted: Orders

## 2021-05-13 DIAGNOSIS — H401131 Primary open-angle glaucoma, bilateral, mild stage: Secondary | ICD-10-CM | POA: Diagnosis not present

## 2021-05-17 ENCOUNTER — Other Ambulatory Visit: Payer: Self-pay | Admitting: Family Medicine

## 2021-05-20 ENCOUNTER — Other Ambulatory Visit: Payer: Self-pay | Admitting: Family Medicine

## 2021-06-02 ENCOUNTER — Other Ambulatory Visit: Payer: Self-pay | Admitting: Family Medicine

## 2021-06-06 NOTE — Progress Notes (Signed)
Phone [phone] In person visit   Subjective:   Preston Pennington is a 70 y.o. year old very pleasant male patient who presents for/with See problem oriented charting Chief Complaint  Patient presents with   Diabetes   Hypertension   Hyperlipidemia    This visit occurred during the SARS-CoV-2 public health emergency.  Safety protocols were in place, including screening questions prior to the visit, additional usage of staff PPE, and extensive cleaning of exam room while observing appropriate contact time as indicated for disinfecting solutions.   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 03/31/2019    Priority: Medium   Hyperlipidemia associated with type 2 diabetes mellitus ([place]) 07/09/2018    Priority: Medium   Left cervical radiculopathy 09/24/2017    Priority: Medium   Hx of colonic polyps 11/06/2016    Priority: Medium   Chronic ankle pain 06/14/2016    Priority: Medium   Thrombocytopenia ([place]) 11/29/2015    Priority: Medium   Gout 04/30/2015    Priority: Medium   Hypertension associated with diabetes ([place]) 04/09/2013    Priority: Medium   BPH (benign prostatic hyperplasia) 04/09/2013    Priority: Medium   Leg length difference, acquired 06/20/2019    Priority: Low   Arthritis of first metatarsophalangeal (MTP) joint of right foot 05/30/2019    Priority: Low   Sesamoiditis 11/08/2018    Priority: Low   Osteoarthritis of spine with radiculopathy, cervical region 11/14/2017    Priority: Low   Right knee pain 11/21/2016    Priority: Low   Erectile dysfunction 02/04/2015    Priority: Low    Medications- reviewed and updated Current Outpatient Medications  Medication Sig Dispense Refill   ACCU-CHEK GUIDE test strip USE 1 STRIP TO CHECK GLUCOSE 4 TIMES DAILY AS DIRECTED 100 each 0   Accu-Chek Softclix  Lancets lancets 4 (four) times daily.     acetaminophen (TYLENOL) 500 MG tablet Take 1,000 mg by mouth every 6 (six) hours as needed for moderate pain.      amLODipine (NORVASC) 10 MG tablet Take 1 tablet by mouth once daily 90 tablet 0   atorvastatin (LIPITOR) 10 MG tablet Take 1 tablet (10 mg total) by mouth 2 (two) times a week. 26 tablet 3   Camphor-Eucalyptus-Menthol (VICKS VAPORUB EX) Apply 1 application topically at bedtime as needed (pain/ congestions).     Colchicine (MITIGARE) 0.6 MG CAPS TAKE 2 TABLETS BY MOUTH AT THE FIRST SIGN OF GOUT THEN TAKE 1 TABLET 2 HOURS LATER THEN 1 TABLET ONCE DAILY UNTIL FLARE IS RESOLVED 30 capsule 1   diclofenac Sodium (VOLTAREN) 1 % GEL APPLY 2 GRAMS TOPICALLY TO AFFECTED AREA 4 TIMES DAILY 100 g 0   empagliflozin (JARDIANCE) 10 MG TABS tablet Take 1 tablet (10 mg total) by mouth daily before breakfast. 90 tablet 3   feeding supplement, ENSURE ENLIVE, (ENSURE ENLIVE) LIQD Take 1 Bottle by mouth daily.     gabapentin (NEURONTIN) 300 MG capsule TAKE 1 CAPSULE BY MOUTH IN THE MORNING AND AT BEDTIME 60 capsule 0   glipiZIDE (GLUCOTROL) 5 MG tablet TAKE 1 TABLET BY MOUTH TWICE DAILY BEFORE MEAL(S) 180 tablet 3   latanoprost (XALATAN) 0.005 % ophthalmic solution Place 1 drop into both eyes at bedtime.      MITIGARE 0.6 MG CAPS TAKE 2 CAPSULES AT  1ST SIGN OF GOUT, THEN 1 CAPSULE 2 HOURS LATER, THEN 1 CAPSULE ONCE DAILY UNTIL FLARE IS RESOLVED 30 capsule 0   Multiple Vitamin (MULTIVITAMIN ADULT PO) Take by mouth.     Polyethyl Glycol-Propyl Glycol (SYSTANE) 0.4-0.3 % SOLN Apply to eye. For dry eyes 3 x a day     quinapril (ACCUPRIL) 20 MG tablet TAKE 1 TABLET BY MOUTH AT BEDTIME 90 tablet 0   Tetrahyd-Glyc-Hypro-PEG-ZnSulf (VISINE TOTALITY MULTI-SYMPTOM OP) Apply 1 drop to eye in the morning, at noon, and at bedtime.     allopurinol (ZYLOPRIM) 100 MG tablet Take 1 tablet (100 mg total) by mouth daily. 30 tablet 11   No current facility-administered medications  for this visit.     Objective:  BP 111/67   Pulse 77   Temp 98 F (36.7 C) (Temporal)   Ht '5\' 7"'$  (1.702 m)   Wt 154 lb (69.9 kg)   SpO2 99%   BMI 24.12 kg/m  Gen: NAD, resting comfortably CV: RRR no murmurs rubs or gallops Lungs: CTAB no crackles, wheeze, rhonchi Ext: no edema Skin: warm, dry Pain at bilateral MTP joints great toe. Also dupuytren contracture- reports slight pain, some pain at DIP joint left 4th finger     Assessment and Plan   # Diabetes S: compliant with on Metformin 1000 mg in past (discontinued per chart 04/08/21 at [facility] - he stopped when starting jardiance), Jardiance 10 mg, daily and Glipizide 5 mg twice daily.  -gabapentin 300 mg in morning and bedtime, started for cervical radiculopathy- he preferred to continue to help with leg pain (possible neuropathy CBGs- 120s up to 200s fasting over last few months. In august between 156-177 fasting Exercise and diet- some hiking and some walking at least twice a week, trying to eat reasonably healthy Lab Results  Component Value Date   HGBA1C 7.1 (H) 01/26/2021   HGBA1C 6.3 (H) 07/27/2020   HGBA1C 6.2 02/26/2020   A/P: Suspect poor control of diabetes based off increase A1c and the fact that metformin was stopped-I apologized to patient if we did not communicate this clearly but I want him on metformin, Jardiance and glipizide altogether-I am hoping A1c is at least below 9 and that this will trend back down by next visit  #hypertension S: compliant with Amlodipine 10 mg once daily and Quinapril 20 mg daily.  Home readings #s: 130/70 BP Readings from Last 3 Encounters:  06/08/21 111/67  01/26/21 138/76  07/27/20 122/84  A/P: Stable. Continue current medications.   #hyperlipidemia S: compliant with Atorvastatin 10 mg once weekly. Attempted to increase  twice weekly September 2021 and later 3x a week- he reports still taking twice a week Lab Results  Component Value Date   CHOL 132 07/27/2020   HDL 42  07/27/2020   LDLCALC 73 07/27/2020   LDLDIRECT 82.0 01/26/2021   TRIG 89 07/27/2020   CHOLHDL 3.1 07/27/2020   A/P: Hopefully cholesterol is improved-if not we can push for 3 times a week again  # BPH status post robotic prostatectomy December 2018 (states no cancer)- Followed with Dr. Alyson Ingles at [facility] u[facility]. For elevated PSA-biopsy in 2017 reassured. No recent visits- PSA has trended down- recheck next year Lab Results  Component Value Date   PSA 1.51 01/26/2021   PSA 2.09 11/25/2019   PSA 4.35 08/21/2017    # Gout S: compliant with allopurinol100 mg daily and Mitigare 0.6 mg prn (has not had to use) Lab Results  Component Value Date  LABURIC 5.1 01/26/2021  A/P: stable recently- no flares- continue to monitor   # Thrombocytopenia/leukopenia S:platelets generally around 100. he states has been like this sinc ebeing on epic system since 2017. Intermittent leukopenia- typically get cbc with differential  - 07/04/18 path smear review "Myeloid population consists predominantly of mature segmented neutrophils. No immature cells are identified. RBCs appeared to be microcytic and hypochromic on smear review. Suggest evaluation for iron deficiency, if clinically indicated. Thrombocytopenia with some large platelets seen. No platelet clumps identified. Reviewed by Francis Gaines Mammarappallil, MD "  - Iron levels normal 07/10/2019 A/P: we will monitor with labs today- suspect will be stable- may simply be his baseline   #right hand pain- injection for ganglion in past. possible dupuytrens though june 2021 by Dr.Corey- reports had injection. His pain is worsening- wants to return.   #Bilateral foot pain at MTP- has been worked on in past by Dr. Georgina Snell- we will ask for his opinion on this as well. Voltaren gel helps some  Recommended follow up: No follow-ups on file. Future Appointments  Date Time Provider [place]  04/13/2022 11:00 AM [facility]    Lab/Order associations:   ICD-10-CM   1. Controlled type 2 diabetes mellitus without complication, without long-term current use of insulin (HCC)  E11.9 Comprehensive metabolic panel    Hemoglobin A1c    LDL cholesterol, direct    CBC with Differential/Platelet    2. Hypertension associated with diabetes ([place])  E11.59    I15.2     3. Hyperlipidemia associated with type 2 diabetes mellitus ([place])  E11.69    E78.5     4. Idiopathic chronic gout of multiple sites without tophus  M1A.09X0     5. Thrombocytopenia ([place])  D69.6     6. Right hand pain  M79.641 Ambulatory referral to Sports Medicine    7. Dupuytren contracture  M72.0 Ambulatory referral to Sports Medicine    8. Bilateral foot pain  M79.671 Ambulatory referral to Sports Medicine   [phone]      Meds ordered this encounter  Medications   allopurinol (ZYLOPRIM) 100 MG tablet    Sig: Take 1 tablet (100 mg total) by mouth daily.    Dispense:  30 tablet    Refill:  11    I,Jada Bradford,acting as a scribe for Garret Reddish, MD.,have documented all relevant documentation on the behalf of Garret Reddish, MD,as directed by  Garret Reddish, MD while in the presence of Garret Reddish, MD.  I, Garret Reddish, MD, have reviewed all documentation for this visit. The documentation on 06/08/21 for the exam, diagnosis, procedures, and orders are all accurate and complete.  Return precautions advised.  Garret Reddish, MD

## 2021-06-08 ENCOUNTER — Other Ambulatory Visit: Payer: Self-pay

## 2021-06-08 ENCOUNTER — Encounter: Payer: Self-pay | Admitting: Family Medicine

## 2021-06-08 ENCOUNTER — Ambulatory Visit (INDEPENDENT_AMBULATORY_CARE_PROVIDER_SITE_OTHER): Payer: Medicare HMO | Admitting: Family Medicine

## 2021-06-08 VITALS — BP 111/67 | HR 77 | Temp 98.0°F | Ht 67.0 in | Wt 154.0 lb

## 2021-06-08 DIAGNOSIS — M720 Palmar fascial fibromatosis [Dupuytren]: Secondary | ICD-10-CM

## 2021-06-08 DIAGNOSIS — I152 Hypertension secondary to endocrine disorders: Secondary | ICD-10-CM

## 2021-06-08 DIAGNOSIS — D696 Thrombocytopenia, unspecified: Secondary | ICD-10-CM | POA: Diagnosis not present

## 2021-06-08 DIAGNOSIS — M79672 Pain in left foot: Secondary | ICD-10-CM

## 2021-06-08 DIAGNOSIS — E785 Hyperlipidemia, unspecified: Secondary | ICD-10-CM

## 2021-06-08 DIAGNOSIS — M79641 Pain in right hand: Secondary | ICD-10-CM | POA: Diagnosis not present

## 2021-06-08 DIAGNOSIS — E1169 Type 2 diabetes mellitus with other specified complication: Secondary | ICD-10-CM

## 2021-06-08 DIAGNOSIS — E1159 Type 2 diabetes mellitus with other circulatory complications: Secondary | ICD-10-CM

## 2021-06-08 DIAGNOSIS — M79671 Pain in right foot: Secondary | ICD-10-CM

## 2021-06-08 DIAGNOSIS — E119 Type 2 diabetes mellitus without complications: Secondary | ICD-10-CM | POA: Diagnosis not present

## 2021-06-08 DIAGNOSIS — M1A09X0 Idiopathic chronic gout, multiple sites, without tophus (tophi): Secondary | ICD-10-CM | POA: Diagnosis not present

## 2021-06-08 LAB — HEMOGLOBIN A1C: Hgb A1c MFr Bld: 7.7 % — ABNORMAL HIGH (ref 4.6–6.5)

## 2021-06-08 LAB — COMPREHENSIVE METABOLIC PANEL
ALT: 27 U/L (ref 0–53)
AST: 23 U/L (ref 0–37)
Albumin: 4 g/dL (ref 3.5–5.2)
Alkaline Phosphatase: 74 U/L (ref 39–117)
BUN: 23 mg/dL (ref 6–23)
CO2: 24 mEq/L (ref 19–32)
Calcium: 9.3 mg/dL (ref 8.4–10.5)
Chloride: 105 mEq/L (ref 96–112)
Creatinine, Ser: 1 mg/dL (ref 0.40–1.50)
GFR: 76.07 mL/min (ref >60.00)
Glucose, Bld: 186 mg/dL — ABNORMAL HIGH (ref 70–99)
Potassium: 4.3 mEq/L (ref 3.5–5.1)
Sodium: 139 mEq/L (ref 135–145)
Total Bilirubin: 0.5 mg/dL (ref 0.2–1.2)
Total Protein: 6.7 g/dL (ref 6.0–8.3)

## 2021-06-08 LAB — CBC WITH DIFFERENTIAL/PLATELET
Basophils Absolute: 0 10*3/uL (ref 0.0–0.1)
Basophils Relative: 0.8 % (ref 0.0–3.0)
Eosinophils Absolute: 0.3 10*3/uL (ref 0.0–0.7)
Eosinophils Relative: 8.5 % — ABNORMAL HIGH (ref 0.0–5.0)
HCT: 46.7 % (ref 39.0–52.0)
Hemoglobin: 14.5 g/dL (ref 13.0–17.0)
Lymphocytes Relative: 45 % (ref 12.0–46.0)
Lymphs Abs: 1.7 10*3/uL (ref 0.7–4.0)
MCHC: 31.1 g/dL (ref 30.0–36.0)
MCV: 82.5 fl (ref 78.0–100.0)
Monocytes Absolute: 0.4 10*3/uL (ref 0.1–1.0)
Monocytes Relative: 9.6 % (ref 3.0–12.0)
Neutro Abs: 1.3 10*3/uL — ABNORMAL LOW (ref 1.4–7.7)
Neutrophils Relative %: 36.1 % — ABNORMAL LOW (ref 43.0–77.0)
Platelets: 91 10*3/uL — ABNORMAL LOW (ref 150.0–400.0)
RBC: 5.66 Mil/uL (ref 4.22–5.81)
RDW: 14 % (ref 11.5–15.5)
WBC: 3.7 10*3/uL — ABNORMAL LOW (ref 4.0–10.5)

## 2021-06-08 LAB — LDL CHOLESTEROL, DIRECT: Direct LDL: 81 mg/dL

## 2021-06-08 MED ORDER — ALLOPURINOL 100 MG PO TABS: 100.0000 mg | ORAL_TABLET | Freq: Every day | ORAL | 11 refills | Status: DC

## 2021-06-08 NOTE — Patient Instructions (Addendum)
Health Maintenance Due  Topic Date Due   COVID-19 Vaccine (4 - Booster for Moderna series)   -Will get this scheduled at his local pharmacy.  01/14/2021   INFLUENZA VACCINE Not available in office yet.   - Please consider getting your flu shot in the Fall. If you get this outside of our office, please let us know.  05/30/2021   Please stop by lab before you go If you have mychart- we will send your results within 3 business days of [place] receiving them.  If you do not have mychart- we will call you about results within 5 business days of [place] receiving them.  *please also note that you will see labs on mychart as soon as they post. I will later go in and write notes on them- will say "notes from Dr. Yong Channel"  We will call you within two weeks about your referral to sports medicine with Dr.Corey. If you do not hear within 2 weeks, give [place] a call.   Reset Metformin 1000 mg twice daily,. continueGlipizide 5 mg twice daily and Jardiance 10 mg daily  Recommended follow up: Return in about 4 months (around 10/08/2021) for follow up- or sooner if needed.

## 2021-06-10 ENCOUNTER — Other Ambulatory Visit: Payer: Self-pay | Admitting: Family Medicine

## 2021-06-10 ENCOUNTER — Other Ambulatory Visit: Payer: Self-pay

## 2021-06-10 MED ORDER — ATORVASTATIN CALCIUM 10 MG PO TABS: 10.0000 mg | ORAL_TABLET | ORAL | 3 refills | Status: DC

## 2021-06-10 NOTE — Progress Notes (Signed)
I, Wendy Poet, LAT, ATC, am serving as scribe for Dr. Lynne Leader.  Preston Pennington is a 70 y.o. male who presents to [place] [facility] today for R hand and B heel pain.  He was last seen by Dr. Georgina Snell on 05/31/20 for the same concerns and was shown a HEP for B plantar fasciitis.  He was advised to use OTC insoles.  He had a prior R 4th MC aspiration and injection on 04/15/20 that significantly improved his R hand pain.  Since his last visit, pt reports his feet aren't bothering him. Pt c/o R hand pain along the 4 MC on the palmar aspect. Pt also c/o L 4th finger pain along the DIP joint ongoing for 2-3 weeks.   He also notes pain at the plantar aspect of great toes bilaterally.  He is wearing insoles bilaterally.  Diagnostic testing: R and L foot XR- 07/25/18  Pertinent review of systems: No fevers or chills  Relevant historical information: Diabetes   Exam:  BP 122/74   Pulse 79   Ht '5\' 7"'$  (1.702 m)   Wt 155 lb 6.4 oz (70.5 kg)   SpO2 98%   BMI 24.34 kg/m  General: Well Developed, well nourished, and in no acute distress.   MSK: Right hand Dupuytren's contracture with nodule at the palmar fourth metacarpal with palpable nodule mildly tender palpation.  Left hand slight swelling and mildly tender at DIP.  Slight decreased range of motion.  Intact flexion and extension strength.  Stable ligamentous exam.  Feet bilaterally mildly tender palpation plantar first MTP.    Lab and Radiology Results  Procedure: Real-time Ultrasound Guided Injection of right fourth Dupuytren's nodule Device: Philips Affiniti 50G Images permanently stored and available for review in PACS Verbal informed consent obtained.  Discussed risks and benefits of procedure. Warned about infection bleeding damage to structures skin hypopigmentation and fat atrophy among others. Patient expresses understanding and agreement Time-out conducted.   Noted no overlying erythema, induration, or  other signs of local infection.   Skin prepped in a sterile fashion.   Local anesthesia: Topical Ethyl chloride.   With sterile technique and under real time ultrasound guidance: 0.25 mg of 80 mg/mL Depo-Medrol solution and 0.25 mL of lidocaine injected into Dupuytren's nodule palmar aspect fourth metacarpal. Fluid seen entering the Dupuytren's nodule.   Completed without difficulty   Pain immediately resolved suggesting accurate placement of the medication.   Advised to call if fevers/chills, erythema, induration, drainage, or persistent bleeding.   Images permanently stored and available for review in the ultrasound unit.  Impression: Technically successful ultrasound guided injection.   X-ray images bilateral hands obtained today personally and independently interpreted  Right hand circular radiolucent fragment interdigital webspace between first and second digits rounded appearing.  No significant abnormality overlying fourth MCP.  Left hand: Mild DJD fourth DIP.  No acute fractures.  Await formal radiology review      Assessment and Plan: 70 y.o. male with right palmar hand nodule consistent with Dupuytren's contracture.  Plan for injection today and referral to hand PT.  Left fourth DIP pain and swelling thought to be exacerbation of DJD.  Plan for hand PT and Voltaren gel  Bilateral sesamoiditis.  Showed patient how to create a first ray first MTP float with adhesive foam pads.  I made foam pads and I showed him how to do it himself provided instruction how to buy the foam pads himself.    Recheck  in about 2 months.  PDMP not reviewed this encounter. Orders Placed This Encounter  Procedures   [place] LIMITED JOINT SPACE STRUCTURES UP RIGHT(NO LINKED CHARGES)    Standing Status:   Future    Number of Occurrences:   1    Standing Expiration Date:   12/14/2021    Order Specific Question:   Reason for Exam (SYMPTOM  OR DIAGNOSIS REQUIRED)    Answer:   right hand pain    Order  Specific Question:   Preferred imaging location?    Answer:   [place]   DG Hand Complete Left    Standing Status:   Future    Number of Occurrences:   1    Standing Expiration Date:   06/13/2022    Order Specific Question:   Reason for Exam (SYMPTOM  OR DIAGNOSIS REQUIRED)    Answer:   left hand pain    Order Specific Question:   Preferred imaging location?    Answer:   [facility]   DG Hand Complete Right    Standing Status:   Future    Number of Occurrences:   1    Standing Expiration Date:   06/13/2022    Order Specific Question:   Reason for Exam (SYMPTOM  OR DIAGNOSIS REQUIRED)    Answer:   right hand pain    Order Specific Question:   Preferred imaging location?    Answer:   [facility]   Ambulatory referral to Physical Therapy    Referral Priority:   Routine    Referral Type:   Physical Medicine    Referral Reason:   Specialty Services Required    Requested Specialty:   Physical Therapy    Number of Visits Requested:   1   No orders of the defined types were placed in this encounter.    Discussed warning signs or symptoms. Please see discharge instructions. Patient expresses understanding.   The above documentation has been reviewed and is accurate and complete Lynne Leader, M.D.

## 2021-06-13 ENCOUNTER — Ambulatory Visit (INDEPENDENT_AMBULATORY_CARE_PROVIDER_SITE_OTHER): Payer: Medicare HMO

## 2021-06-13 ENCOUNTER — Ambulatory Visit: Payer: Self-pay

## 2021-06-13 ENCOUNTER — Other Ambulatory Visit: Payer: Self-pay

## 2021-06-13 ENCOUNTER — Ambulatory Visit (INDEPENDENT_AMBULATORY_CARE_PROVIDER_SITE_OTHER): Payer: Medicare HMO | Admitting: Family Medicine

## 2021-06-13 VITALS — BP 122/74 | HR 79 | Ht 67.0 in | Wt 155.4 lb

## 2021-06-13 DIAGNOSIS — M79641 Pain in right hand: Secondary | ICD-10-CM

## 2021-06-13 DIAGNOSIS — M2580 Other specified joint disorders, unspecified joint: Secondary | ICD-10-CM | POA: Diagnosis not present

## 2021-06-13 DIAGNOSIS — M79642 Pain in left hand: Secondary | ICD-10-CM

## 2021-06-13 DIAGNOSIS — M720 Palmar fascial fibromatosis [Dupuytren]: Secondary | ICD-10-CM

## 2021-06-13 NOTE — Progress Notes (Signed)
Left hand x-ray looks normal to radiology.

## 2021-06-13 NOTE — Progress Notes (Signed)
Right hand x-ray looks normal to radiology

## 2021-06-13 NOTE — Patient Instructions (Addendum)
Thank you for coming in today.   Please get an Xray today before you leave   Lucio Edward [id number] Self Adhesive Foam Sheets - Pack of 20, Class Pack of Craft Pages for [employer] and [place], [place] for [employer], [employer] or [employer]  Please use Voltaren gel (Generic Diclofenac Gel) up to 4x daily for pain as needed.  This is available over-the-counter as both the name brand Voltaren gel and the generic diclofenac gel.   I've referred you to Physical Therapy.  Let us know if you don't hear from them in one week.

## 2021-06-16 ENCOUNTER — Other Ambulatory Visit: Payer: Self-pay | Admitting: Family Medicine

## 2021-07-11 DIAGNOSIS — M79642 Pain in left hand: Secondary | ICD-10-CM | POA: Diagnosis not present

## 2021-07-11 DIAGNOSIS — M79641 Pain in right hand: Secondary | ICD-10-CM | POA: Diagnosis not present

## 2021-07-14 ENCOUNTER — Other Ambulatory Visit: Payer: Self-pay | Admitting: Family Medicine

## 2021-07-18 DIAGNOSIS — M79641 Pain in right hand: Secondary | ICD-10-CM | POA: Diagnosis not present

## 2021-07-18 DIAGNOSIS — M79642 Pain in left hand: Secondary | ICD-10-CM | POA: Diagnosis not present

## 2021-07-19 DIAGNOSIS — Z23 Encounter for immunization: Secondary | ICD-10-CM | POA: Diagnosis not present

## 2021-07-26 DIAGNOSIS — M79642 Pain in left hand: Secondary | ICD-10-CM | POA: Diagnosis not present

## 2021-07-26 DIAGNOSIS — M79641 Pain in right hand: Secondary | ICD-10-CM | POA: Diagnosis not present

## 2021-08-09 DIAGNOSIS — M79642 Pain in left hand: Secondary | ICD-10-CM | POA: Diagnosis not present

## 2021-08-09 DIAGNOSIS — M79641 Pain in right hand: Secondary | ICD-10-CM | POA: Diagnosis not present

## 2021-08-11 ENCOUNTER — Other Ambulatory Visit: Payer: Self-pay

## 2021-08-11 MED ORDER — MITIGARE 0.6 MG PO CAPS: ORAL_CAPSULE | ORAL | 0 refills | Status: DC

## 2021-08-12 ENCOUNTER — Other Ambulatory Visit: Payer: Self-pay | Admitting: Family Medicine

## 2021-08-16 ENCOUNTER — Other Ambulatory Visit: Payer: Self-pay | Admitting: Family Medicine

## 2021-08-16 DIAGNOSIS — M79641 Pain in right hand: Secondary | ICD-10-CM | POA: Diagnosis not present

## 2021-08-16 DIAGNOSIS — M79642 Pain in left hand: Secondary | ICD-10-CM | POA: Diagnosis not present

## 2021-08-23 ENCOUNTER — Telehealth: Payer: Self-pay

## 2021-08-23 DIAGNOSIS — M79641 Pain in right hand: Secondary | ICD-10-CM | POA: Diagnosis not present

## 2021-08-23 DIAGNOSIS — M79642 Pain in left hand: Secondary | ICD-10-CM | POA: Diagnosis not present

## 2021-08-23 MED ORDER — LISINOPRIL 20 MG PO TABS: 20.0000 mg | ORAL_TABLET | Freq: Every day | ORAL | 3 refills | Status: DC

## 2021-08-23 NOTE — Telephone Encounter (Signed)
Pt called stating that his pharmacy does not have Quinapril. Preston Pennington wants to know if Dr Yong Channel can call in an alternative prescription. Please Advise.

## 2021-08-23 NOTE — Telephone Encounter (Signed)
Try lisinopril 20 mg instead of quinapril 20 mg-I sent this into the pharmacy-update me in 2 weeks with home blood pressures

## 2021-08-23 NOTE — Telephone Encounter (Signed)
Pt called back to check the status of his prescription. He would like a call when done. Please Advise.

## 2021-08-30 DIAGNOSIS — M79641 Pain in right hand: Secondary | ICD-10-CM | POA: Diagnosis not present

## 2021-08-30 DIAGNOSIS — M79642 Pain in left hand: Secondary | ICD-10-CM | POA: Diagnosis not present

## 2021-09-06 DIAGNOSIS — M79641 Pain in right hand: Secondary | ICD-10-CM | POA: Diagnosis not present

## 2021-09-06 DIAGNOSIS — M79642 Pain in left hand: Secondary | ICD-10-CM | POA: Diagnosis not present

## 2021-09-08 ENCOUNTER — Other Ambulatory Visit: Payer: Self-pay | Admitting: Family Medicine

## 2021-09-09 ENCOUNTER — Other Ambulatory Visit: Payer: Self-pay

## 2021-09-09 MED ORDER — MITIGARE 0.6 MG PO CAPS
ORAL_CAPSULE | ORAL | 0 refills | Status: DC
Start: 2021-09-09 — End: 2022-01-16

## 2021-09-13 DIAGNOSIS — M79642 Pain in left hand: Secondary | ICD-10-CM | POA: Diagnosis not present

## 2021-09-13 DIAGNOSIS — M79641 Pain in right hand: Secondary | ICD-10-CM | POA: Diagnosis not present

## 2021-09-15 ENCOUNTER — Other Ambulatory Visit: Payer: Self-pay | Admitting: Family Medicine

## 2021-09-20 DIAGNOSIS — H401131 Primary open-angle glaucoma, bilateral, mild stage: Secondary | ICD-10-CM | POA: Diagnosis not present

## 2021-09-28 DIAGNOSIS — M79641 Pain in right hand: Secondary | ICD-10-CM | POA: Diagnosis not present

## 2021-09-28 DIAGNOSIS — M79642 Pain in left hand: Secondary | ICD-10-CM | POA: Diagnosis not present

## 2021-10-06 ENCOUNTER — Other Ambulatory Visit: Payer: Self-pay | Admitting: Family Medicine

## 2021-10-11 ENCOUNTER — Other Ambulatory Visit: Payer: Self-pay

## 2021-10-11 MED ORDER — DICLOFENAC SODIUM 1 % EX GEL: CUTANEOUS | 3 refills | Status: DC

## 2021-10-14 ENCOUNTER — Ambulatory Visit (INDEPENDENT_AMBULATORY_CARE_PROVIDER_SITE_OTHER): Payer: Medicare HMO | Admitting: Family Medicine

## 2021-10-14 ENCOUNTER — Other Ambulatory Visit: Payer: Self-pay

## 2021-10-14 ENCOUNTER — Encounter: Payer: Self-pay | Admitting: Family Medicine

## 2021-10-14 VITALS — BP 132/82 | HR 84 | Temp 98.4°F | Ht 67.0 in | Wt 163.0 lb

## 2021-10-14 DIAGNOSIS — E1159 Type 2 diabetes mellitus with other circulatory complications: Secondary | ICD-10-CM

## 2021-10-14 DIAGNOSIS — E119 Type 2 diabetes mellitus without complications: Secondary | ICD-10-CM | POA: Diagnosis not present

## 2021-10-14 DIAGNOSIS — D696 Thrombocytopenia, unspecified: Secondary | ICD-10-CM

## 2021-10-14 DIAGNOSIS — M1A09X0 Idiopathic chronic gout, multiple sites, without tophus (tophi): Secondary | ICD-10-CM | POA: Diagnosis not present

## 2021-10-14 DIAGNOSIS — I152 Hypertension secondary to endocrine disorders: Secondary | ICD-10-CM

## 2021-10-14 DIAGNOSIS — E785 Hyperlipidemia, unspecified: Secondary | ICD-10-CM

## 2021-10-14 DIAGNOSIS — E1169 Type 2 diabetes mellitus with other specified complication: Secondary | ICD-10-CM

## 2021-10-14 LAB — COMPREHENSIVE METABOLIC PANEL
ALT: 20 U/L (ref 0–53)
AST: 16 U/L (ref 0–37)
Albumin: 4.2 g/dL (ref 3.5–5.2)
Alkaline Phosphatase: 62 U/L (ref 39–117)
BUN: 23 mg/dL (ref 6–23)
CO2: 26 mEq/L (ref 19–32)
Calcium: 10 mg/dL (ref 8.4–10.5)
Chloride: 104 mEq/L (ref 96–112)
Creatinine, Ser: 1.01 mg/dL (ref 0.40–1.50)
GFR: 74.98 mL/min (ref >60.00)
Glucose, Bld: 154 mg/dL — ABNORMAL HIGH (ref 70–99)
Potassium: 4.2 mEq/L (ref 3.5–5.1)
Sodium: 139 mEq/L (ref 135–145)
Total Bilirubin: 0.5 mg/dL (ref 0.2–1.2)
Total Protein: 6.9 g/dL (ref 6.0–8.3)

## 2021-10-14 LAB — CBC WITH DIFFERENTIAL/PLATELET
Basophils Absolute: 0 10*3/uL (ref 0.0–0.1)
Basophils Relative: 0.7 % (ref 0.0–3.0)
Eosinophils Absolute: 0.2 10*3/uL (ref 0.0–0.7)
Eosinophils Relative: 4.9 % (ref 0.0–5.0)
HCT: 45.7 % (ref 39.0–52.0)
Hemoglobin: 14.3 g/dL (ref 13.0–17.0)
Lymphocytes Relative: 37 % (ref 12.0–46.0)
Lymphs Abs: 1.4 10*3/uL (ref 0.7–4.0)
MCHC: 31.3 g/dL (ref 30.0–36.0)
MCV: 82.6 fl (ref 78.0–100.0)
Monocytes Absolute: 0.3 10*3/uL (ref 0.1–1.0)
Monocytes Relative: 8.7 % (ref 3.0–12.0)
Neutro Abs: 1.8 10*3/uL (ref 1.4–7.7)
Neutrophils Relative %: 48.7 % (ref 43.0–77.0)
Platelets: 94 10*3/uL — ABNORMAL LOW (ref 150.0–400.0)
RBC: 5.53 Mil/uL (ref 4.22–5.81)
RDW: 13.7 % (ref 11.5–15.5)
WBC: 3.8 10*3/uL — ABNORMAL LOW (ref 4.0–10.5)

## 2021-10-14 LAB — LDL CHOLESTEROL, DIRECT: Direct LDL: 68 mg/dL

## 2021-10-14 LAB — HEMOGLOBIN A1C: Hgb A1c MFr Bld: 6.5 % (ref 4.6–6.5)

## 2021-10-14 NOTE — Patient Instructions (Addendum)
Please stop by lab before you go If you have mychart- we will send your results within 3 business days of [place] receiving them.  If you do not have mychart- we will call you about results within 5 business days of [place] receiving them.  *please also note that you will see labs on mychart as soon as they post. I will later go in and write notes on them- will say "notes from Dr. Yong Channel"  Recommended follow up: Schedule physical after March 30th, 2023   Happy Holidays!   Schedule visit with dentist- we can place referral if needed but usually not required

## 2021-10-14 NOTE — Progress Notes (Signed)
Phone [phone] In person visit   Subjective:   Preston Pennington is a 70 y.o. year old very pleasant male patient who presents for/with See problem oriented charting Chief Complaint  Patient presents with   Diabetes    4 Month f/u. Med refill of gabapentin     This visit occurred during the SARS-CoV-2 public health emergency.  Safety protocols were in place, including screening questions prior to the visit, additional usage of staff PPE, and extensive cleaning of exam room while observing appropriate contact time as indicated for disinfecting solutions.   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 03/31/2019    Priority: Medium    Hyperlipidemia associated with type 2 diabetes mellitus ([place]) 07/09/2018    Priority: Medium    Left cervical radiculopathy 09/24/2017    Priority: Medium    Hx of colonic polyps 11/06/2016    Priority: Medium    Chronic ankle pain 06/14/2016    Priority: Medium    Thrombocytopenia ([place]) 11/29/2015    Priority: Medium    Gout 04/30/2015    Priority: Medium    Hypertension associated with diabetes ([place]) 04/09/2013    Priority: Medium    BPH (benign prostatic hyperplasia) 04/09/2013    Priority: Medium    Leg length difference, acquired 06/20/2019    Priority: Low   Arthritis of first metatarsophalangeal (MTP) joint of right foot 05/30/2019    Priority: Low   Sesamoiditis 11/08/2018    Priority: Low   Osteoarthritis of spine with radiculopathy, cervical region 11/14/2017    Priority: Low   Right knee pain 11/21/2016    Priority: Low   Erectile dysfunction 02/04/2015    Priority: Low   Dupuytren's contracture of right hand 06/13/2021    Medications- reviewed and updated Current Outpatient Medications  Medication Sig Dispense Refill   ACCU-CHEK GUIDE test strip USE 1 STRIP TO  CHECK GLUCOSE 4 TIMES DAILY AS DIRECTED 100 each 0   Accu-Chek Softclix Lancets lancets 4 (four) times daily.     acetaminophen (TYLENOL) 500 MG tablet Take 1,000 mg by mouth every 6 (six) hours as needed for moderate pain.      allopurinol (ZYLOPRIM) 100 MG tablet Take 1 tablet (100 mg total) by mouth daily. 30 tablet 11   amLODipine (NORVASC) 10 MG tablet Take 1 tablet by mouth once daily 90 tablet 0   atorvastatin (LIPITOR) 10 MG tablet Take 1 tablet (10 mg total) by mouth 3 (three) times a week. 30 tablet 3   Camphor-Eucalyptus-Menthol (VICKS VAPORUB EX) Apply 1 application topically at bedtime as needed (pain/ congestions).     Colchicine (MITIGARE) 0.6 MG CAPS TAKE 2 TABLETS BY MOUTH AT THE FIRST SIGN OF GOUT THEN TAKE 1 TABLET 2 HOURS LATER THEN 1 TABLET ONCE DAILY UNTIL FLARE IS RESOLVED 30 capsule 1   diclofenac Sodium (VOLTAREN) 1 % GEL APPLY 2 GRAMS TOPICALLY TO THE AFFECTED AREA 4 TIMES DAILY 100 g 3   empagliflozin (JARDIANCE) 10 MG TABS tablet Take 1 tablet (10 mg total) by mouth daily before breakfast. 90 tablet 3   feeding supplement, ENSURE ENLIVE, (ENSURE ENLIVE) LIQD Take 1 Bottle by mouth daily.     gabapentin (NEURONTIN) 300 MG capsule TAKE 1 CAPSULE BY MOUTH IN THE MORNING AND AT BEDTIME 60 capsule 0   glipiZIDE (GLUCOTROL) 5 MG  tablet TAKE 1 TABLET BY MOUTH TWICE DAILY BEFORE MEAL(S) 180 tablet 3   latanoprost (XALATAN) 0.005 % ophthalmic solution Place 1 drop into both eyes at bedtime.      lisinopril (ZESTRIL) 20 MG tablet Take 1 tablet (20 mg total) by mouth daily. 90 tablet 3   metFORMIN (GLUCOPHAGE) 1000 MG tablet TAKE 1 TABLET BY MOUTH TWICE DAILY WITH MEALS 180 tablet 0   MITIGARE 0.6 MG CAPS TAKE 2 CAPSULES AT 1ST SIGN OF GOUT, THEN 1 CAPSULE 2 HOURS LATER, THEN 1 CAPSULE ONCE DAILY UNTIL FLARE IS RESOLVED 90 capsule 0   Multiple Vitamin (MULTIVITAMIN ADULT PO) Take by mouth.     Polyethyl Glycol-Propyl Glycol (SYSTANE) 0.4-0.3 % SOLN Apply to eye. For dry eyes 3 x a  day     Tetrahyd-Glyc-Hypro-PEG-ZnSulf (VISINE TOTALITY MULTI-SYMPTOM OP) Apply 1 drop to eye in the morning, at noon, and at bedtime.     No current facility-administered medications for this visit.     Objective:  BP 132/82    Pulse 84    Temp 98.4 F (36.9 C) (Temporal)    Ht 5\' 7"  (1.702 m)    Wt 163 lb (73.9 kg)    SpO2 96%    BMI 25.53 kg/m  Gen: NAD, resting comfortably CV: RRR no murmurs rubs or gallops Lungs: CTAB no crackles, wheeze, rhonchi Ext: no edema Skin: warm, dry Msk: built up shoe on right per baseline    Assessment and Plan   # Diabetes S: compliant with on Metformin 1000 mg twice daily (was off last visit with A1c elevated) and Glipizide 5 mg twice daily, Jardiance 10 mg.  -gabapentin started for cervical radiculopathy- he preferred to continue as seemed to help with leg pain (possible neuropathy- helps with hand) CBGs- in last month ranging from 101- 168 in the morning- usually under 150. 168 was eating late prior noight- one of the higher readings he had since September.  Exercise and diet- walking in apartment complex Lab Results  Component Value Date   HGBA1C 7.7 (H) 06/08/2021   HGBA1C 7.1 (H) 01/26/2021   HGBA1C 6.3 (H) 07/27/2020   A/P: Hopefully improve-update A1c with labs today. Continue current meds for now  #hypertension S: compliant with Amlodipine 10 mg once daily and discontinued Quinapril 20 mg daily.  Home readings #s: 130/80s BP Readings from Last 3 Encounters:  10/14/21 132/82  06/13/21 122/74  06/08/21 111/67  A/P:  Controlled. Continue current medications.    #hyperlipidemia S: compliant with Atorvastatin 10 mg once weekly. Attempted increase to twice weekly September 2021-last visit we called and try to increase to 3 times a week to get LDL under 70-patient reports taking 3x a week  Lab Results  Component Value Date   CHOL 132 07/27/2020   HDL 42 07/27/2020   LDLCALC 73 07/27/2020   LDLDIRECT 81.0 06/08/2021   TRIG 89  07/27/2020   CHOLHDL 3.1 07/27/2020   A/P: update LDL - hopeful under 70- can tweak to 4x a weak if needed- no side effects thankfully.   #BPH status post robotic prostatectomy December 2018- Followed with Dr. Alyson Ingles at [facility] urology. For elevated PSA-biopsy in 2017 reassured.  He is no longer seeing urology-we will recheck PSA next visit Lab Results  Component Value Date   PSA 1.51 01/26/2021   PSA 2.09 11/25/2019   PSA 4.35 08/21/2017   # Gout S: compliant with Allopurinol 100 mg daily and Colchicine 0.6 mg prn.  Lab Results  Component Value  Date   LABURIC 5.1 01/26/2021   A/P: Denies recent flares/stable-continue current medication-update uric acid next visit  # Thrombocytopenia/leukopenia S:platelets generally around 100. he stated had been like this for over 2 years. Intermittent leukopenia- typically get cbc with differential  07/04/18 path smear review "Myeloid population consists predominantly of mature  segmented neutrophils. No immature cells are identified. RBCs appear to be microcytic and hypochromic on smear review. Suggested evaluation for iron deficiency, if clinically indicated. Thrombocytopenia with some large platelets seen. No platelet clumps identified. Reviewed by Francis Gaines Mammarappallil, MD "  Iron levels normal 07/10/2019 A/P: We will check CBC with differential today-if any worsening consider oncology visit  #right hand pain- injection for ganglion. possible dupuytrens though june 2021 Dr.Corey.  Has been working with physical therapy.feeling better overall  Recommended follow up: schedule cpe 1 year out from 01/26/21 Future Appointments  Date Time Provider [place]  04/13/2022 11:00 AM [facility]    Lab/Order associations:   ICD-10-CM   1. Controlled type 2 diabetes mellitus without complication, without long-term current use of insulin (HCC)  E11.9     2. Hypertension associated with diabetes ([place])  E11.59    I15.2      3. Hyperlipidemia associated with type 2 diabetes mellitus ([place])  E11.69    E78.5     4. Idiopathic chronic gout of multiple sites without tophus  M1A.09X0     5. Thrombocytopenia ([place])  D69.6       No orders of the defined types were placed in this encounter.   I,Jada Bradford,acting as a scribe for Garret Reddish, MD.,have documented all relevant documentation on the behalf of Garret Reddish, MD,as directed by  Garret Reddish, MD while in the presence of Garret Reddish, MD.   I, Garret Reddish, MD, have reviewed all documentation for this visit. The documentation on 10/14/21 for the exam, diagnosis, procedures, and orders are all accurate and complete.   Return precautions advised.  Garret Reddish, MD

## 2021-10-20 ENCOUNTER — Other Ambulatory Visit: Payer: Self-pay | Admitting: Family Medicine

## 2021-10-20 NOTE — Telephone Encounter (Signed)
Pt requesting refill on Gabapentin. Last OV 06/10/2021. Has upcoming appt in June.

## 2021-11-30 ENCOUNTER — Telehealth (INDEPENDENT_AMBULATORY_CARE_PROVIDER_SITE_OTHER): Payer: Medicare HMO | Admitting: Family Medicine

## 2021-11-30 ENCOUNTER — Other Ambulatory Visit: Payer: Self-pay

## 2021-11-30 ENCOUNTER — Telehealth: Payer: Self-pay

## 2021-11-30 ENCOUNTER — Other Ambulatory Visit: Payer: Self-pay | Admitting: Family Medicine

## 2021-11-30 ENCOUNTER — Encounter: Payer: Self-pay | Admitting: Family Medicine

## 2021-11-30 VITALS — BP 117/83 | HR 94 | Temp 98.0°F | Ht 67.0 in | Wt 153.0 lb

## 2021-11-30 DIAGNOSIS — I10 Essential (primary) hypertension: Secondary | ICD-10-CM | POA: Diagnosis not present

## 2021-11-30 DIAGNOSIS — E785 Hyperlipidemia, unspecified: Secondary | ICD-10-CM | POA: Diagnosis not present

## 2021-11-30 DIAGNOSIS — E1169 Type 2 diabetes mellitus with other specified complication: Secondary | ICD-10-CM | POA: Diagnosis not present

## 2021-11-30 DIAGNOSIS — U071 COVID-19: Secondary | ICD-10-CM

## 2021-11-30 MED ORDER — MOLNUPIRAVIR EUA 200MG CAPSULE: 4.0000 | ORAL_CAPSULE | Freq: Two times a day (BID) | ORAL | 0 refills | Status: AC

## 2021-11-30 MED ORDER — MOLNUPIRAVIR EUA 200MG CAPSULE: 4.0000 | ORAL_CAPSULE | Freq: Two times a day (BID) | ORAL | 0 refills | Status: DC

## 2021-11-30 MED ORDER — BENZONATATE 100 MG PO CAPS: 100.0000 mg | ORAL_CAPSULE | Freq: Two times a day (BID) | ORAL | 0 refills | Status: DC | PRN

## 2021-11-30 NOTE — Progress Notes (Signed)
Phone [phone] Virtual visit via Video note   Subjective:  Chief complaint: Chief Complaint  Patient presents with   Covid positive    Pt state he tested positive for COVID, he has burning nose and weakness with dry throat, he denies fever.    This visit type was conducted due to national recommendations for restrictions regarding the COVID-19 Pandemic (e.g. social distancing).  This format is felt to be most appropriate for this patient at this time balancing risks to patient and risks to population by having him in for in person visit.  No physical exam was performed (except for noted visual exam or audio findings with Telehealth visits).    Our team/I connected with [name] at 10:40 AM EST by a video enabled telemedicine application ([email or web address] or caregility through epic) and verified that I am speaking with the correct person using two identifiers.  Location patient: [facility] Location provider: [facility], office Persons participating in the virtual visit:  patient  Our team/I discussed the limitations of evaluation and management by telemedicine and the availability of in person appointments. In light of current covid-19 pandemic, patient also understands that we are trying to protect them by minimizing in office contact if at all possible.  The patient expressed consent for telemedicine visit and agreed to proceed. Patient understands insurance will be billed.   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 03/31/2019    Priority: Medium    Hyperlipidemia associated with type 2 diabetes mellitus ([place]) 07/09/2018    Priority: Medium    Left cervical radiculopathy 09/24/2017    Priority: Medium    Hx of colonic polyps 11/06/2016    Priority: Medium    Chronic ankle pain 06/14/2016    Priority: Medium     Thrombocytopenia ([place]) 11/29/2015    Priority: Medium    Gout 04/30/2015    Priority: Medium    Hypertension associated with diabetes ([place]) 04/09/2013    Priority: Medium    BPH (benign prostatic hyperplasia) 04/09/2013    Priority: Medium    Leg length difference, acquired 06/20/2019    Priority: Low   Arthritis of first metatarsophalangeal (MTP) joint of right foot 05/30/2019    Priority: Low   Sesamoiditis 11/08/2018    Priority: Low   Osteoarthritis of spine with radiculopathy, cervical region 11/14/2017    Priority: Low   Right knee pain 11/21/2016    Priority: Low   Erectile dysfunction 02/04/2015    Priority: Low   Dupuytren's contracture of right hand 06/13/2021    Medications- reviewed and updated Current Outpatient Medications  Medication Sig Dispense Refill   benzonatate (TESSALON) 100 MG capsule Take 1 capsule (100 mg total) by mouth 2 (two) times daily as needed for cough. 20 capsule 0   molnupiravir EUA (LAGEVRIO) 200 mg CAPS capsule Take 4 capsules (800 mg total) by mouth 2 (two) times daily for 5 days. 40 capsule 0   ACCU-CHEK GUIDE test strip USE 1 STRIP TO CHECK GLUCOSE 4 TIMES DAILY AS DIRECTED 100 each 0   Accu-Chek Softclix Lancets lancets 4 (four) times daily.     acetaminophen (TYLENOL) 500 MG tablet Take 1,000 mg by mouth every 6 (six) hours as needed for moderate pain.      allopurinol (ZYLOPRIM) 100 MG tablet Take 1 tablet (100 mg total) by  mouth daily. 30 tablet 11   amLODipine (NORVASC) 10 MG tablet Take 1 tablet by mouth once daily 90 tablet 0   atorvastatin (LIPITOR) 10 MG tablet Take 1 tablet (10 mg total) by mouth 3 (three) times a week. 30 tablet 3   Camphor-Eucalyptus-Menthol (VICKS VAPORUB EX) Apply 1 application topically at bedtime as needed (pain/ congestions).     Colchicine (MITIGARE) 0.6 MG CAPS TAKE 2 TABLETS BY MOUTH AT THE FIRST SIGN OF GOUT THEN TAKE 1 TABLET 2 HOURS LATER THEN 1 TABLET ONCE DAILY UNTIL FLARE IS RESOLVED 30 capsule 1    diclofenac Sodium (VOLTAREN) 1 % GEL APPLY 2 GRAMS TOPICALLY TO THE AFFECTED AREA 4 TIMES DAILY 100 g 3   empagliflozin (JARDIANCE) 10 MG TABS tablet Take 1 tablet (10 mg total) by mouth daily before breakfast. 90 tablet 3   feeding supplement, ENSURE ENLIVE, (ENSURE ENLIVE) LIQD Take 1 Bottle by mouth daily.     gabapentin (NEURONTIN) 300 MG capsule TAKE 1 CAPSULE BY MOUTH IN THE MORNING AND AT BEDTIME 180 capsule 3   glipiZIDE (GLUCOTROL) 5 MG tablet TAKE 1 TABLET BY MOUTH TWICE DAILY BEFORE MEAL(S) 180 tablet 3   latanoprost (XALATAN) 0.005 % ophthalmic solution Place 1 drop into both eyes at bedtime.      lisinopril (ZESTRIL) 20 MG tablet Take 1 tablet (20 mg total) by mouth daily. 90 tablet 3   metFORMIN (GLUCOPHAGE) 1000 MG tablet TAKE 1 TABLET BY MOUTH TWICE DAILY WITH MEALS 180 tablet 0   MITIGARE 0.6 MG CAPS TAKE 2 CAPSULES AT 1ST SIGN OF GOUT, THEN 1 CAPSULE 2 HOURS LATER, THEN 1 CAPSULE ONCE DAILY UNTIL FLARE IS RESOLVED 90 capsule 0   Multiple Vitamin (MULTIVITAMIN ADULT PO) Take by mouth.     Polyethyl Glycol-Propyl Glycol (SYSTANE) 0.4-0.3 % SOLN Apply to eye. For dry eyes 3 x a day     Tetrahyd-Glyc-Hypro-PEG-ZnSulf (VISINE TOTALITY MULTI-SYMPTOM OP) Apply 1 drop to eye in the morning, at noon, and at bedtime.     No current facility-administered medications for this visit.     Objective:  BP 117/83    Pulse 94    Temp 98 F (36.7 C)    Ht 5\' 7"  (1.702 m)    Wt 153 lb (69.4 kg)    BMI 23.96 kg/m  self reported vitals Gen: NAD, resting comfortably Lungs: nonlabored, normal respiratory rate  Skin: appears dry, no obvious rash     Assessment and Plan   # COVID +  S:first symptoms were 2 nights ago on 1/30. Burning in nose, weakness, dry itchy throat, and runny nose. Slight headache and has body aches as well. Dry cough as well  ROS- No fever, chills,congestion, shortness of breath, fatigue, nausea, vomiting, diarrhea, or new loss of taste or smell.  A/P: Patient with  testing confirming covid 19 with first day of covid 19 symptoms  Vaccination status:Fully vaccinated including bivalent booster  Therefore: - recommended patient watch closely for shortness of breath or confusion or worsening symptoms and if those occur patient should contact us immediately or seek care in the emergency department -recommended patient consider purchasing pulse oximeter and if levels 94% or below persistently- seek care at the hospital - Patient needs to self isolate  for at least 5 days since first symptom AND at least 24 hours fever free without fever reducing medications AND have improvement in respiratory symptoms . After 5 days can end self isolation but still needs to wear mask for  additional 5 days .  -Patient should inform close contacts about exposure (anyone patient been around unmasked for more than 15 minutes)   If High risk for complications-we discussed outpatient therapeutic options including paxlovid (and risk of rebound), molnupiravir - patient opted for molnupiravir so we do not have to discontinue his statin.   #hypertension S: medication: compliant with Amlodipine 10 mg once daily and Quinapril 20 mg daily.  BP Readings from Last 3 Encounters:  11/30/21 117/83  10/14/21 132/82  06/13/21 122/74  A/P: Initial blood pressure was elevated but improved on repeat/well-controlled-continue current medication as long as p.o. intake is adequate-we discussed if dehydrated holding quinapril  #hyperlipidemia S: Medication: Atorvastatin 10 mg 3 times a week Lab Results  Component Value Date   CHOL 132 07/27/2020   HDL 42 07/27/2020   LDLCALC 73 07/27/2020   LDLDIRECT 68.0 10/14/2021   TRIG 89 07/27/2020   CHOLHDL 3.1 07/27/2020   A/P: Excellent control last check with LDL under 70-so we did not have to alter any of his medicines we opted to use molnupiravir instead of paxlovid   Recommended follow up: As needed for new, worsening, persistent symptoms Future  Appointments  Date Time Provider [place]  04/11/2022  8:20 AM Marin Olp, MD [facility]  04/13/2022 11:00 AM [facility] HEALTH COACH [facility]    Lab/Order associations:   ICD-10-CM   1. COVID-19  U07.1     2. Primary hypertension  I10     3. Hyperlipidemia associated with type 2 diabetes mellitus (HCC)  E11.69    E78.5       Meds ordered this encounter  Medications   benzonatate (TESSALON) 100 MG capsule    Sig: Take 1 capsule (100 mg total) by mouth 2 (two) times daily as needed for cough.    Dispense:  20 capsule    Refill:  0   molnupiravir EUA (LAGEVRIO) 200 mg CAPS capsule    Sig: Take 4 capsules (800 mg total) by mouth 2 (two) times daily for 5 days.    Dispense:  40 capsule    Refill:  0   I,Jada Bradford,acting as a scribe for Garret Reddish, MD.,have documented all relevant documentation on the behalf of Garret Reddish, MD,as directed by  Garret Reddish, MD while in the presence of Garret Reddish, MD.   I, Garret Reddish, MD, have reviewed all documentation for this visit. The documentation on 11/30/21 for the exam, diagnosis, procedures, and orders are all accurate and complete.   Return precautions advised.  Garret Reddish, MD

## 2021-11-30 NOTE — Telephone Encounter (Signed)
Patient called and wanted to know if the [facility] on [address] had the rx sent today. I called and confirmed that they did. They also called the patient and advised him to come and pick it up via drive through. I called and left patient a voicemail to advise him that he medication was available and ready for him to pickup.

## 2021-11-30 NOTE — Patient Instructions (Signed)
There are no preventive care reminders to display for this patient.  Recommended follow up: No follow-ups on file. 

## 2021-12-05 ENCOUNTER — Telehealth: Payer: Self-pay

## 2021-12-05 NOTE — Telephone Encounter (Signed)
Is requesting a letter to be out of work today.  Patient states he is still feeling weak and wants to rest today.

## 2021-12-05 NOTE — Telephone Encounter (Signed)
You can write him a note for today-I will check with him and see if he wants to go ahead and take tomorrow off as well

## 2021-12-05 NOTE — Telephone Encounter (Signed)
Called and spoke with pt and made aware letter has been written and visible via mychart.

## 2021-12-05 NOTE — Telephone Encounter (Signed)
Patient calling back to f/u on message below.

## 2021-12-05 NOTE — Telephone Encounter (Signed)
Ok to write? 

## 2021-12-15 ENCOUNTER — Other Ambulatory Visit: Payer: Self-pay | Admitting: Family Medicine

## 2021-12-27 ENCOUNTER — Other Ambulatory Visit: Payer: Self-pay | Admitting: Family Medicine

## 2022-01-05 ENCOUNTER — Other Ambulatory Visit: Payer: Self-pay | Admitting: Family Medicine

## 2022-01-14 ENCOUNTER — Other Ambulatory Visit: Payer: Self-pay | Admitting: Family Medicine

## 2022-01-24 DIAGNOSIS — H401131 Primary open-angle glaucoma, bilateral, mild stage: Secondary | ICD-10-CM | POA: Diagnosis not present

## 2022-02-13 ENCOUNTER — Other Ambulatory Visit: Payer: Self-pay | Admitting: Family Medicine

## 2022-02-14 ENCOUNTER — Other Ambulatory Visit: Payer: Self-pay | Admitting: Family Medicine

## 2022-03-16 DIAGNOSIS — R69 Illness, unspecified: Secondary | ICD-10-CM | POA: Diagnosis not present

## 2022-03-17 ENCOUNTER — Other Ambulatory Visit: Payer: Self-pay | Admitting: Family Medicine

## 2022-03-19 ENCOUNTER — Other Ambulatory Visit: Payer: Self-pay | Admitting: Family Medicine

## 2022-03-20 ENCOUNTER — Other Ambulatory Visit: Payer: Self-pay | Admitting: Family Medicine

## 2022-03-20 DIAGNOSIS — R69 Illness, unspecified: Secondary | ICD-10-CM | POA: Diagnosis not present

## 2022-04-03 ENCOUNTER — Other Ambulatory Visit: Payer: Self-pay | Admitting: Family Medicine

## 2022-04-10 ENCOUNTER — Other Ambulatory Visit: Payer: Self-pay | Admitting: Family Medicine

## 2022-04-11 ENCOUNTER — Ambulatory Visit (INDEPENDENT_AMBULATORY_CARE_PROVIDER_SITE_OTHER): Payer: Medicare HMO | Admitting: Family Medicine

## 2022-04-11 ENCOUNTER — Encounter: Payer: Self-pay | Admitting: Family Medicine

## 2022-04-11 VITALS — BP 108/60 | HR 73 | Temp 98.0°F | Ht 67.0 in | Wt 158.6 lb

## 2022-04-11 DIAGNOSIS — Z0000 Encounter for general adult medical examination without abnormal findings: Secondary | ICD-10-CM | POA: Diagnosis not present

## 2022-04-11 DIAGNOSIS — E785 Hyperlipidemia, unspecified: Secondary | ICD-10-CM

## 2022-04-11 DIAGNOSIS — M1A09X0 Idiopathic chronic gout, multiple sites, without tophus (tophi): Secondary | ICD-10-CM | POA: Diagnosis not present

## 2022-04-11 DIAGNOSIS — R972 Elevated prostate specific antigen [PSA]: Secondary | ICD-10-CM

## 2022-04-11 DIAGNOSIS — E119 Type 2 diabetes mellitus without complications: Secondary | ICD-10-CM

## 2022-04-11 DIAGNOSIS — I10 Essential (primary) hypertension: Secondary | ICD-10-CM | POA: Diagnosis not present

## 2022-04-11 DIAGNOSIS — D696 Thrombocytopenia, unspecified: Secondary | ICD-10-CM | POA: Diagnosis not present

## 2022-04-11 DIAGNOSIS — E1169 Type 2 diabetes mellitus with other specified complication: Secondary | ICD-10-CM

## 2022-04-11 LAB — CBC WITH DIFFERENTIAL/PLATELET
Basophils Absolute: 0 10*3/uL (ref 0.0–0.1)
Basophils Relative: 0.6 % (ref 0.0–3.0)
Eosinophils Absolute: 0.2 10*3/uL (ref 0.0–0.7)
Eosinophils Relative: 4.5 % (ref 0.0–5.0)
HCT: 45.3 % (ref 39.0–52.0)
Hemoglobin: 14.4 g/dL (ref 13.0–17.0)
Lymphocytes Relative: 45.1 % (ref 12.0–46.0)
Lymphs Abs: 1.8 10*3/uL (ref 0.7–4.0)
MCHC: 31.8 g/dL (ref 30.0–36.0)
MCV: 81.6 fl (ref 78.0–100.0)
Monocytes Absolute: 0.4 10*3/uL (ref 0.1–1.0)
Monocytes Relative: 8.9 % (ref 3.0–12.0)
Neutro Abs: 1.6 10*3/uL (ref 1.4–7.7)
Neutrophils Relative %: 40.9 % — ABNORMAL LOW (ref 43.0–77.0)
Platelets: 90 10*3/uL — ABNORMAL LOW (ref 150.0–400.0)
RBC: 5.55 Mil/uL (ref 4.22–5.81)
RDW: 14.3 % (ref 11.5–15.5)
WBC: 4 10*3/uL (ref 4.0–10.5)

## 2022-04-11 LAB — COMPREHENSIVE METABOLIC PANEL
ALT: 26 U/L (ref 0–53)
AST: 22 U/L (ref 0–37)
Albumin: 4.2 g/dL (ref 3.5–5.2)
Alkaline Phosphatase: 62 U/L (ref 39–117)
BUN: 20 mg/dL (ref 6–23)
CO2: 27 mEq/L (ref 19–32)
Calcium: 9.7 mg/dL (ref 8.4–10.5)
Chloride: 106 mEq/L (ref 96–112)
Creatinine, Ser: 0.97 mg/dL (ref 0.40–1.50)
GFR: 78.43 mL/min (ref >60.00)
Glucose, Bld: 118 mg/dL — ABNORMAL HIGH (ref 70–99)
Potassium: 4.3 mEq/L (ref 3.5–5.1)
Sodium: 139 mEq/L (ref 135–145)
Total Bilirubin: 0.4 mg/dL (ref 0.2–1.2)
Total Protein: 6.5 g/dL (ref 6.0–8.3)

## 2022-04-11 LAB — URIC ACID: Uric Acid, Serum: 5.4 mg/dL (ref 4.0–7.8)

## 2022-04-11 LAB — LIPID PANEL
Cholesterol: 99 mg/dL (ref 0–200)
HDL: 33.9 mg/dL — ABNORMAL LOW (ref >39.00)
LDL Cholesterol: 54 mg/dL (ref 0–99)
NonHDL: 65.55
Total CHOL/HDL Ratio: 3
Triglycerides: 56 mg/dL (ref 0.0–149.0)
VLDL: 11.2 mg/dL (ref 0.0–40.0)

## 2022-04-11 LAB — HEMOGLOBIN A1C: Hgb A1c MFr Bld: 6.2 % (ref 4.6–6.5)

## 2022-04-11 LAB — PSA: PSA: 1.34 ng/mL (ref 0.10–4.00)

## 2022-04-11 NOTE — Progress Notes (Signed)
Phone: [phone]   Subjective:  Patient presents today for their annual physical. Chief complaint-noted.   See problem oriented charting- ROS- full  review of systems was completed and negative per full ROS sheet completed by patient- later notes after brushing teeth will clear throat and sees tinge of blood. Not coughing up blood. 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   Diabetes mellitus without complication ([place])    Difficult intubation    "mouth was not wide enough"   Dry eyes    History of adenomatous polyp of colon 11/06/2016   10/2016 adenoma x3 - recall colon 10/2019   Hyperlipidemia    Hypertension    Neuromuscular disorder ([place])    past hx tingling left arm    Prostate enlargement 2010   See's Urologist    Patient Active Problem List   Diagnosis Date Noted   Diabetes mellitus type II, controlled ([place]) 04/09/2013    Priority: High   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 03/31/2019    Priority: Medium    Hyperlipidemia associated with type 2 diabetes mellitus ([place]) 07/09/2018    Priority: Medium    Left cervical radiculopathy 09/24/2017    Priority: Medium    Hx of colonic polyps 11/06/2016    Priority: Medium    Chronic ankle pain 06/14/2016    Priority: Medium    Thrombocytopenia ([place]) 11/29/2015    Priority: Medium    Gout 04/30/2015    Priority: Medium    Essential hypertension 04/09/2013    Priority: Medium    BPH (benign prostatic hyperplasia) 04/09/2013    Priority: Medium    Leg length difference, acquired 06/20/2019    Priority: Low   Arthritis of first metatarsophalangeal (MTP) joint of right foot 05/30/2019    Priority: Low   Sesamoiditis 11/08/2018    Priority: Low   Osteoarthritis of spine with radiculopathy, cervical region 11/14/2017     Priority: Low   Right knee pain 11/21/2016    Priority: Low   Erectile dysfunction 02/04/2015    Priority: Low   Dupuytren's contracture of right hand 06/13/2021   Past Surgical History:  Procedure Laterality Date   COLONOSCOPY     COLONOSCOPY WITH PROPOFOL N/A 11/02/2016   Procedure: COLONOSCOPY WITH PROPOFOL;  Surgeon: Gatha Mayer, MD;  Location: [facility];  Service: Endoscopy;  Laterality: N/A;   COLONOSCOPY WITH PROPOFOL N/A 05/18/2020   Procedure: COLONOSCOPY WITH PROPOFOL;  Surgeon: Gatha Mayer, MD;  Location: [facility];  Service: Endoscopy;  Laterality: N/A;   LEG SURGERY Right 1997   accident related   XI ROBOTIC ASSISTED SIMPLE PROSTATECTOMY N/A 10/05/2017   Procedure: XI ROBOTIC ASSISTED SIMPLE PROSTATECTOMY WITH UMBILICAL HERNIA REPAIR;  Surgeon: Cleon Gustin, MD;  Location: [facility];  Service: Urology;  Laterality: N/A;    Family History  Problem Relation Age of Onset   Other Father        and mother- states died of old age. States no medical problems in parents or siblings   Colon cancer Neg Hx    Colon polyps Neg Hx    Esophageal cancer Neg Hx    Rectal cancer Neg Hx    Stomach cancer Neg Hx     Medications- reviewed and updated Current Outpatient Medications  Medication Sig Dispense Refill   ACCU-CHEK GUIDE  test strip USE 1 STRIP TO CHECK GLUCOSE 4 TIMES DAILY AS DIRECTED 100 each 0   Accu-Chek Softclix Lancets lancets 4 (four) times daily.     acetaminophen (TYLENOL) 500 MG tablet Take 1,000 mg by mouth every 6 (six) hours as needed for moderate pain.      allopurinol (ZYLOPRIM) 100 MG tablet Take 1 tablet (100 mg total) by mouth daily. 30 tablet 11   amLODipine (NORVASC) 10 MG tablet Take 1 tablet by mouth once daily 90 tablet 0   atorvastatin (LIPITOR) 10 MG tablet TAKE 1 TABLET BY MOUTH THREE TIMES A WEEK 30 tablet 0   diclofenac Sodium (VOLTAREN) 1 % GEL APPLY 2 GRAMS TOPICALLY TO THE AFFECTED AREA FOUR TIMES DAILY 100 g 0   empagliflozin  (JARDIANCE) 10 MG TABS tablet Take 1 tablet (10 mg total) by mouth daily before breakfast. 90 tablet 3   feeding supplement, ENSURE ENLIVE, (ENSURE ENLIVE) LIQD Take 1 Bottle by mouth daily.     gabapentin (NEURONTIN) 300 MG capsule TAKE 1 CAPSULE BY MOUTH IN THE MORNING AND AT BEDTIME 180 capsule 3   glipiZIDE (GLUCOTROL) 5 MG tablet TAKE 1 TABLET BY MOUTH TWICE DAILY BEFORE MEAL(S) 180 tablet 0   latanoprost (XALATAN) 0.005 % ophthalmic solution Place 1 drop into both eyes at bedtime.      lisinopril (ZESTRIL) 20 MG tablet Take 1 tablet (20 mg total) by mouth daily. 90 tablet 3   metFORMIN (GLUCOPHAGE) 1000 MG tablet TAKE 1 TABLET BY MOUTH TWICE DAILY WITH MEALS 180 tablet 0   MITIGARE 0.6 MG CAPS TAKE 2 CAPSULES AT 1ST SIGN OF GOUT, THEN 1 CAPSULE 2 HOURS LATER, THEN 1 CAPSULE ONCE DAILY UNTIL FLARE IS RESOLVED 30 capsule 0   Multiple Vitamin (MULTIVITAMIN ADULT PO) Take by mouth.     Polyethyl Glycol-Propyl Glycol (SYSTANE) 0.4-0.3 % SOLN Apply to eye. For dry eyes 3 x a day     Tetrahyd-Glyc-Hypro-PEG-ZnSulf (VISINE TOTALITY MULTI-SYMPTOM OP) Apply 1 drop to eye in the morning, at noon, and at bedtime.     No current facility-administered medications for this visit.    Allergies-reviewed and updated No Known Allergies  Social History   Social History Narrative   Married. 4 children (1 boy, 3 girls). 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       Hobbies: enjoys reading novels. Enjoys African authors   Goes to [employer] ([place])   Objective  Objective:  BP 108/60   Pulse 73   Temp 98 F (36.7 C)   Ht '5\' 7"'$  (1.702 m)   Wt 158 lb 9.6 oz (71.9 kg)   SpO2 97%   BMI 24.84 kg/m  Gen: NAD, resting comfortably HEENT: Mucous membranes are moist. Oropharynx  normal Neck: no thyromegaly CV: RRR no murmurs rubs or gallops Lungs: CTAB no crackles, wheeze, rhonchi Abdomen: soft/nontender/nondistended/normal bowel sounds. No rebound or guarding.  Ext: no edema Skin: warm, dry Neuro: grossly normal, moves all extremities, PERRLA    Diabetic Foot Exam - Simple   Simple Foot Form Diabetic Foot exam was performed with the following findings: Yes 04/11/2022  8:43 AM  Visual Inspection No deformities, no ulcerations, no other skin breakdown bilaterally: Yes Sensation Testing Intact to touch and monofilament  testing bilaterally: Yes Pulse Check Posterior Tibialis and Dorsalis pulse intact bilaterally: Yes Comments        Assessment and Plan  71 y.o. male presenting for annual physical.  Health Maintenance counseling: 1. Anticipatory guidance: Patient counseled regarding regular dental exams -q6 months, eye exams -yearly,  avoiding smoking and second hand smoke- , limiting alcohol to 2 beverages per day doesn't drink, no drugs.   2. Risk factor reduction:  Advised patient of need for regular exercise and diet rich and fruits and vegetables to reduce risk of heart attack and stroke.  Exercise- not much lately- encouraged to restart gradually- has some bands wants to use.  Diet/weight management-weight within 2 pounds of last year's physical.  Wt Readings from Last 3 Encounters:  04/11/22 158 lb 9.6 oz (71.9 kg)  11/30/21 153 lb (69.4 kg)  10/14/21 163 lb (73.9 kg)  3. Immunizations/screenings/ancillary studies-had COVID in February, up-to-date on vaccinations= may get another covid shot in fall along with flu shot  Immunization History  Administered Date(s) Administered   Fluad Quad(high Dose 65+) 07/10/2019, 07/27/2020   Hepatitis B 02/27/2021, 03/30/2021   Influenza Split 07/31/2011   Influenza, High Dose Seasonal PF 09/01/2017   Influenza-Unspecified 07/16/2015, 08/14/2018, 09/14/2021   Moderna Sars-Covid-2 Vaccination 10/27/2019,  11/24/2019, 09/16/2020, 03/17/2021, 07/19/2021   Pneumococcal Conjugate-13 03/29/2017, 02/12/2018   Pneumococcal Polysaccharide-23 07/31/2011, 07/10/2019, 02/24/2020   Tdap 11/29/2015   Zoster Recombinat (Shingrix) 02/12/2018, 05/01/2018  4. Prostate cancer screening- last year PSA continues to trend down after prostatectomy for BPH-he reports last urology visit over a year ago- encouraged urology visit but we will check PSA to make sure not trending up Lab Results  Component Value Date   PSA 1.51 01/26/2021   PSA 2.09 11/25/2019   PSA 4.35 08/21/2017   5. Colon cancer screening - 05/18/2020 with 5-year repeat due to polyp history 6. Skin cancer screening-low risk due to melanin content. advised regular sunscreen use. Denies worrisome, changing, or new skin lesions.  7. Smoking associated screening (lung cancer screening, AAA screen 65-75, UA)-never smoker 8. STD screening - only active with wife so not needed  Status of chronic or acute concerns   # Diabetes with neuropathy S: compliant with on Metformin 1000 mg BID and Glipizide 5 mg twice daily and jardiance 10 mg -gabapentin started for cervical radiculopathy- he prefers to continue as seems to help with leg pain (possible neuropathy) CBGs- 120s to 140s for most part. As low as 90s- high of 200s- states thinks he overate night before.  Lab Results  Component Value Date   HGBA1C 6.5 10/14/2021   A/P: hopefully stable- update a1c today. Continue current meds for now- likely tolerate 7.5 or less  #hypertension S: compliant with Amlodipine 10 mg once daily and lisinopril 20 mg daily.  BP Readings from Last 3 Encounters:  04/11/22 108/60  11/30/21 117/83  10/14/21 132/82  A/P: Controlled. Continue current medications.   #hyperlipidemia S: compliant with Atorvastatin 10 mg  3x weekly Lab Results  Component Value Date   CHOL 132 07/27/2020   HDL 42 07/27/2020   LDLCALC 73 07/27/2020   LDLDIRECT 68.0 10/14/2021   TRIG 89  07/27/2020   CHOLHDL 3.1 07/27/2020   A/P: hopefully stable- update Lipid panel today. Continue current meds for now with Last LDL at ideal goal under 70  % BPH status post robotic prostatectomy December 2018- Follows with Dr[facility] A[facility]son [facility] at [facility].   For elevated PSA-biopsy in 2017 reassuring. Doing well overall  #  Gout S: compliant with Allopurinol 100 mg daily and Colchicine/mitigare 0.6 mg prn.  No recent flares Lab Results  Component Value Date   LABURIC 5.1 01/26/2021  A/P: stable - will check uric acid with labs- goal under 6 but unlikely to change unless has flare ups   # Thrombocytopenia/leukopenia S:platelets generally around 100. he states has been like this for many years.  Intermittent leukopenia- typically get cbc with differential  07/04/18 path smear review "Myeloid population consists predominantly of mature  segmented neutrophils. No immature cells are identified. RBCs appear to be microcytic and hypochromic on smear review. Suggest evaluation for iron deficiency, if clinically indicated. Thrombocytopenia with some large platelets seen. No platelet clumps identified. Reviewed by Francis Gaines Mammarappallil, MD "  Iron levels normal 07/10/2019 A/P: reassuring prior workup- make sure not worsening- update labs- if worsens consider hematology visit   #See ROS as well- if issue not improving can refer to ENT  Recommended follow up: Return in about 6 months (around 10/11/2022) for followup or sooner if needed.Schedule b4 you leave. Future Appointments  Date Time Provider [place]  04/13/2022 11:30 AM [facility] HEALTH COACH [facility]   Lab/Order associations: fasting   ICD-10-CM   1. Preventative health care  Z00.00     2. Controlled type 2 diabetes mellitus without complication, without long-term current use of insulin (HCC)  E11.9 CBC with Differential/Platelet    Comprehensive metabolic panel    Lipid panel    Hemoglobin A1c    3. Hyperlipidemia  associated with type 2 diabetes mellitus ([place])  E11.69    E78.5     4. Essential hypertension  I10     5. Thrombocytopenia ([place])  D69.6     6. Elevated PSA  R97.20 PSA    7. Idiopathic chronic gout of multiple sites without tophus  M1A.92J1 Uric acid      No orders of the defined types were placed in this encounter.   Return precautions advised.  Garret Reddish, MD

## 2022-04-11 NOTE — Patient Instructions (Addendum)
Sign release of information at the check out desk for diabetic eye exam.  Discussed trying humidifier at night and if not improving let me know- could be having slight drainage from a nosebleed causing blood in your spit up- if worsens please let us know. We would refer to ENT if worsens or not improving  Please stop by lab before you go If you have mychart- we will send your results within 3 business days of [place] receiving them.  If you do not have mychart- we will call you about results within 5 business days of [place] receiving them.  *please also note that you will see labs on mychart as soon as they post. I will later go in and write notes on them- will say "notes from Dr. Yong Channel"    Recommended follow up: Return in about 6 months (around 10/11/2022) for followup or sooner if needed.Schedule b4 you leave.

## 2022-04-13 ENCOUNTER — Ambulatory Visit (INDEPENDENT_AMBULATORY_CARE_PROVIDER_SITE_OTHER): Payer: Medicare HMO

## 2022-04-13 DIAGNOSIS — Z0000 Encounter for general adult medical examination without abnormal findings: Secondary | ICD-10-CM

## 2022-04-13 NOTE — Patient Instructions (Signed)
Preston Pennington , Thank you for taking time to come for your Medicare Wellness Visit. I appreciate your ongoing commitment to your health goals. Please review the following plan we discussed and let me know if I can assist you in the future.   Screening recommendations/referrals: Colonoscopy: Done 05/18/20 repeat every 5 year  Recommended yearly ophthalmology/optometry visit for glaucoma screening and checkup Recommended yearly dental visit for hygiene and checkup  Vaccinations: Influenza vaccine: Done 09/14/21 repeat every year  Pneumococcal vaccine: Up to date Tdap vaccine: Done 11/29/15 repeat every 10 years  Shingles vaccine: Completed 02/12/18 & 05/01/18   Covid-19: Completed 12/28,/20, 1/25, 09/16/20 5/19, 07/19/21  Advanced directives: Advance directive discussed with you today. Even though you declined this today please call our office should you change your mind and we can give you the proper paperwork for you to fill out.  Conditions/risks identified: none at this time   Next appointment: Follow up in one year for your annual wellness visit.   Preventive Care 72 Years and Older, Male Preventive care refers to lifestyle choices and visits with your health care provider that can promote health and wellness. What does preventive care include? A yearly physical exam. This is also called an annual well check. Dental exams once or twice a year. Routine eye exams. Ask your health care provider how often you should have your eyes checked. Personal lifestyle choices, including: Daily care of your teeth and gums. Regular physical activity. Eating a healthy diet. Avoiding tobacco and drug use. Limiting alcohol use. Practicing safe sex. Taking low doses of aspirin every day. Taking vitamin and mineral supplements as recommended by your health care provider. What happens during an annual well check? The services and screenings done by your health care provider during your annual well check will  depend on your age, overall health, lifestyle risk factors, and family history of disease. Counseling  Your health care provider may ask you questions about your: Alcohol use. Tobacco use. Drug use. Emotional well-being. Home and relationship well-being. Sexual activity. Eating habits. History of falls. Memory and ability to understand (cognition). Work and work Statistician. Screening  You may have the following tests or measurements: Height, weight, and BMI. Blood pressure. Lipid and cholesterol levels. These may be checked every 5 years, or more frequently if you are over 72 years old. Skin check. Lung cancer screening. You may have this screening every year starting at age 72 if you have a 30-pack-year history of smoking and currently smoke or have quit within the past 15 years. Fecal occult blood test (FOBT) of the stool. You may have this test every year starting at age 72. Flexible sigmoidoscopy or colonoscopy. You may have a sigmoidoscopy every 5 years or a colonoscopy every 10 years starting at age 72. Prostate cancer screening. Recommendations will vary depending on your family history and other risks. Hepatitis C blood test. Hepatitis B blood test. Sexually transmitted disease (STD) testing. Diabetes screening. This is done by checking your blood sugar (glucose) after you have not eaten for a while (fasting). You may have this done every 1-3 years. Abdominal aortic aneurysm (AAA) screening. You may need this if you are a current or former smoker. Osteoporosis. You may be screened starting at age 72 if you are at high risk. Talk with your health care provider about your test results, treatment options, and if necessary, the need for more tests. Vaccines  Your health care provider may recommend certain vaccines, such as: Influenza vaccine. This is  recommended every year. Tetanus, diphtheria, and acellular pertussis (Tdap, Td) vaccine. You may need a Td booster every 10  years. Zoster vaccine. You may need this after age 72. Pneumococcal 13-valent conjugate (PCV13) vaccine. One dose is recommended after age 72. Pneumococcal polysaccharide (PPSV23) vaccine. One dose is recommended after age 72. Talk to your health care provider about which screenings and vaccines you need and how often you need them. This information is not intended to replace advice given to you by your health care provider. Make sure you discuss any questions you have with your health care provider. Document Released: 11/12/2015 Document Revised: 07/05/2016 Document Reviewed: 08/17/2015 Elsevier Interactive Patient Education  2017 [employer] Prevention in the Home Falls can cause injuries. They can happen to people of all ages. There are many things you can do to make your home safe and to help prevent falls. What can I do on the outside of my home? Regularly fix the edges of walkways and driveways and fix any cracks. Remove anything that might make you trip as you walk through a door, such as a raised step or threshold. Trim any bushes or trees on the path to your home. Use bright outdoor lighting. Clear any walking paths of anything that might make someone trip, such as rocks or tools. Regularly check to see if handrails are loose or broken. Make sure that both sides of any steps have handrails. Any raised decks and porches should have guardrails on the edges. Have any leaves, snow, or ice cleared regularly. Use sand or salt on walking paths during winter. Clean up any spills in your garage right away. This includes oil or grease spills. What can I do in the bathroom? Use night lights. Install grab bars by the toilet and in the tub and shower. Do not use towel bars as grab bars. Use non-skid mats or decals in the tub or shower. If you need to sit down in the shower, use a plastic, non-slip stool. Keep the floor dry. Clean up any water that spills on the floor as soon as it  happens. Remove soap buildup in the tub or shower regularly. Attach bath mats securely with double-sided non-slip rug tape. Do not have throw rugs and other things on the floor that can make you trip. What can I do in the bedroom? Use night lights. Make sure that you have a light by your bed that is easy to reach. Do not use any sheets or blankets that are too big for your bed. They should not hang down onto the floor. Have a firm chair that has side arms. You can use this for support while you get dressed. Do not have throw rugs and other things on the floor that can make you trip. What can I do in the kitchen? Clean up any spills right away. Avoid walking on wet floors. Keep items that you use a lot in easy-to-reach places. If you need to reach something above you, use a strong step stool that has a grab bar. Keep electrical cords out of the way. Do not use floor polish or wax that makes floors slippery. If you must use wax, use non-skid floor wax. Do not have throw rugs and other things on the floor that can make you trip. What can I do with my stairs? Do not leave any items on the stairs. Make sure that there are handrails on both sides of the stairs and use them. Fix handrails that are  broken or loose. Make sure that handrails are as long as the stairways. Check any carpeting to make sure that it is firmly attached to the stairs. Fix any carpet that is loose or worn. Avoid having throw rugs at the top or bottom of the stairs. If you do have throw rugs, attach them to the floor with carpet tape. Make sure that you have a light switch at the top of the stairs and the bottom of the stairs. If you do not have them, ask someone to add them for you. What else can I do to help prevent falls? Wear shoes that: Do not have high heels. Have rubber bottoms. Are comfortable and fit you well. Are closed at the toe. Do not wear sandals. If you use a stepladder: Make sure that it is fully opened.  Do not climb a closed stepladder. Make sure that both sides of the stepladder are locked into place. Ask someone to hold it for you, if possible. Clearly mark and make sure that you can see: Any grab bars or handrails. First and last steps. Where the edge of each step is. Use tools that help you move around (mobility aids) if they are needed. These include: Canes. Walkers. Scooters. Crutches. Turn on the lights when you go into a dark area. Replace any light bulbs as soon as they burn out. Set up your furniture so you have a clear path. Avoid moving your furniture around. If any of your floors are uneven, fix them. If there are any pets around you, be aware of where they are. Review your medicines with your doctor. Some medicines can make you feel dizzy. This can increase your chance of falling. Ask your doctor what other things that you can do to help prevent falls. This information is not intended to replace advice given to you by your health care provider. Make sure you discuss any questions you have with your health care provider. Document Released: 08/12/2009 Document Revised: 03/23/2016 Document Reviewed: 11/20/2014 Elsevier Interactive Patient Education  2017 [employer].

## 2022-04-13 NOTE — Progress Notes (Signed)
Virtual Visit via Telephone Note  I connected with  Preston Pennington on 04/13/22 at 11:30 AM EDT by telephone and verified that I am speaking with the correct person using two identifiers.  Medicare Annual Wellness visit completed telephonically due to Covid-19 pandemic.   Persons participating in this call: This Health Coach and this patient.   Location: Patient: Home Provider: Office    I discussed the limitations, risks, security and privacy concerns of performing an evaluation and management service by telephone and the availability of in person appointments. The patient expressed understanding and agreed to proceed.  Unable to perform video visit due to video visit attempted and failed and/or patient does not have video capability.   Some vital signs may be absent or patient reported.   Preston Brace, LPN   Subjective:   Preston Pennington is a 71 y.o. male who presents for Medicare Annual/Subsequent preventive examination.  Review of Systems     Cardiac Risk Factors include: advanced age (>87mn, >>1women);diabetes mellitus;hypertension;dyslipidemia;male gender     Objective:    There were no vitals filed for this visit. There is no height or weight on file to calculate BMI.     04/13/2022   11:20 AM 04/08/2021   11:14 AM 05/18/2020    9:54 AM 12/25/2017    8:40 AM 10/05/2017    1:52 PM 10/03/2017   10:05 AM 10/01/2017    9:44 AM  Advanced Directives  Does Patient Have a Medical Advance Directive? No No No No Yes Yes Yes  Type of Advance Directive     Living will Living will Living will  Does patient want to make changes to medical advance directive?     No - Patient declined No - Patient declined   Would patient like information on creating a medical advance directive? No - Patient declined Yes (MAU/Ambulatory/Procedural Areas - Information given) No - Patient declined No - Patient declined       Current Medications (verified) Outpatient Encounter Medications as of  04/13/2022  Medication Sig   ACCU-CHEK GUIDE test strip USE 1 STRIP TO CHECK GLUCOSE 4 TIMES DAILY AS DIRECTED   Accu-Chek Softclix Lancets lancets 4 (four) times daily.   acetaminophen (TYLENOL) 500 MG tablet Take 1,000 mg by mouth every 6 (six) hours as needed for moderate pain.    allopurinol (ZYLOPRIM) 100 MG tablet Take 1 tablet (100 mg total) by mouth daily.   amLODipine (NORVASC) 10 MG tablet Take 1 tablet by mouth once daily   atorvastatin (LIPITOR) 10 MG tablet TAKE 1 TABLET BY MOUTH THREE TIMES A WEEK   diclofenac Sodium (VOLTAREN) 1 % GEL APPLY 2 GRAMS TOPICALLY TO THE AFFECTED AREA FOUR TIMES DAILY   empagliflozin (JARDIANCE) 10 MG TABS tablet Take 1 tablet (10 mg total) by mouth daily before breakfast.   feeding supplement, ENSURE ENLIVE, (ENSURE ENLIVE) LIQD Take 1 Bottle by mouth daily.   gabapentin (NEURONTIN) 300 MG capsule TAKE 1 CAPSULE BY MOUTH IN THE MORNING AND AT BEDTIME   glipiZIDE (GLUCOTROL) 5 MG tablet TAKE 1 TABLET BY MOUTH TWICE DAILY BEFORE MEAL(S)   latanoprost (XALATAN) 0.005 % ophthalmic solution Place 1 drop into both eyes at bedtime.    lisinopril (ZESTRIL) 20 MG tablet Take 1 tablet (20 mg total) by mouth daily.   metFORMIN (GLUCOPHAGE) 1000 MG tablet TAKE 1 TABLET BY MOUTH TWICE DAILY WITH MEALS   MITIGARE 0.6 MG CAPS TAKE 2 CAPSULES AT 1ST SIGN OF GOUT, THEN 1 CAPSULE  2 HOURS LATER, THEN 1 CAPSULE ONCE DAILY UNTIL FLARE IS RESOLVED   Multiple Vitamin (MULTIVITAMIN ADULT PO) Take by mouth.   Polyethyl Glycol-Propyl Glycol (SYSTANE) 0.4-0.3 % SOLN Apply to eye. For dry eyes 3 x a day   Tetrahyd-Glyc-Hypro-PEG-ZnSulf (VISINE TOTALITY MULTI-SYMPTOM OP) Apply 1 drop to eye in the morning, at noon, and at bedtime.   No facility-administered encounter medications on file as of 04/13/2022.    Allergies (verified) Patient has no known allergies.   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    Diabetes mellitus without complication ([place])    Difficult intubation    "mouth was not wide enough"   Dry eyes    History of adenomatous polyp of colon 11/06/2016   10/2016 adenoma x3 - recall colon 10/2019   Hyperlipidemia    Hypertension    Neuromuscular disorder ([place])    past hx tingling left arm    Prostate enlargement 2010   See's Urologist    Past Surgical History:  Procedure Laterality Date   COLONOSCOPY     COLONOSCOPY WITH PROPOFOL N/A 11/02/2016   Procedure: COLONOSCOPY WITH PROPOFOL;  Surgeon: Gatha Mayer, MD;  Location: [facility];  Service: Endoscopy;  Laterality: N/A;   COLONOSCOPY WITH PROPOFOL N/A 05/18/2020   Procedure: COLONOSCOPY WITH PROPOFOL;  Surgeon: Gatha Mayer, MD;  Location: [facility];  Service: Endoscopy;  Laterality: N/A;   LEG SURGERY Right 1997   accident related   XI ROBOTIC ASSISTED SIMPLE PROSTATECTOMY N/A 10/05/2017   Procedure: XI ROBOTIC ASSISTED SIMPLE PROSTATECTOMY WITH UMBILICAL HERNIA REPAIR;  Surgeon: Cleon Gustin, MD;  Location: [facility];  Service: Urology;  Laterality: N/A;   Family History  Problem Relation Age of Onset   Other Father        and mother- states died of old age. States no medical problems in parents or siblings   Colon cancer Neg Hx    Colon polyps Neg Hx    Esophageal cancer Neg Hx    Rectal cancer Neg Hx    Stomach cancer Neg Hx    Social History   Socioeconomic History   Marital status: Married    Spouse name: [name]   Number of children: 4   Years of education: Not on file   Highest education level: Not on file  Occupational History   Not on file  Tobacco Use   Smoking status: Never   Smokeless tobacco: Never  Vaping Use   Vaping Use: Never used  Substance and Sexual Activity   Alcohol use: No    Alcohol/week: 0.0 standard drinks of alcohol   Drug use: No   Sexual activity: Yes  Other Topics Concern   Not on file  Social History Narrative   Married. 4 children (1 boy, 3 girls). 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       Hobbies: enjoys reading novels. Enjoys African authors   Goes to [place]   Social Determinants of Health   Financial Resource Strain: Low Risk  (04/13/2022)   Overall Financial Resource Strain (CARDIA)    Difficulty of Paying Living Expenses: Not hard at all  Food Insecurity: No Food Insecurity (  04/13/2022)   Hunger Vital Sign    Worried About Running Out of Food in the Last Year: Never true    [place] in the Last Year: Never true  Transportation Needs: No Transportation Needs (04/13/2022)   PRAPARE - Hydrologist (Medical): No    Lack of Transportation (Non-Medical): No  Physical Activity: Inactive (04/13/2022)   Exercise Vital Sign    Days of Exercise per Week: 0 days    Minutes of Exercise per Session: 0 min  Stress: No Stress Concern Present (04/13/2022)   [place]    Feeling of Stress : Not at all  Social Connections: [place] (04/13/2022)   Social Connection and Isolation Panel [NHANES]    Frequency of Communication with Friends and Family: More than three times a week    Frequency of Social Gatherings with Friends and Family: More than three times a week    Attends Religious Services: More than 4 times per year    Active Member of [employer] or Organizations: Yes    Attends Archivist Meetings: 1 to 4 times per year    Marital Status: Married    Tobacco Counseling Counseling given: Not Answered   Clinical Intake:  Pre-visit preparation completed: Yes  Pain : No/denies pain     BMI - recorded: 23.96 Nutritional Status: BMI of 19-24  Normal Nutritional Risks:  None Diabetes: Yes CBG done?: Yes (128 per pt) CBG resulted in Enter/ Edit results?: No Did pt. bring in CBG monitor from home?: No  How often do you need to have someone help you when you read instructions, pamphlets, or other written materials from your doctor or pharmacy?: 1 - Never  Diabetic?Nutrition Risk Assessment:  Has the patient had any N/V/D within the last 2 months?  No  Does the patient have any non-healing wounds?  No  Has the patient had any unintentional weight loss or weight gain?  No   Diabetes:   Is the patient diabetic?  Yes  If diabetic, was a CBG obtained today?  Yes  Did the patient bring in their glucometer from home?  No  How often do you monitor your CBG's? Daily.   Financial Strains and Diabetes Management:  Are you having any financial strains with the device, your supplies or your medication? No .  Does the patient want to be seen by Chronic Care Management for management of their diabetes?  No  Would the patient like to be referred to a Nutritionist or for Diabetic Management?  No   Diabetic Exams:  Diabetic Eye Exam: Overdue for diabetic eye exam. Pt has been advised about the importance in completing this exam. Patient advised to call and schedule an eye exam. Diabetic Foot Exam: Completed 04/11/22   Interpreter Needed?: No  Information entered by :: Charlott Rakes, LPN   Activities of Daily Living    04/13/2022   11:22 AM  In your present state of health, do you have any difficulty performing the following activities:  Hearing? 1  Comment wears hearing aids  Vision? 0  Difficulty concentrating or making decisions? 0  Walking or climbing stairs? 0  Dressing or bathing? 0  Doing errands, shopping? 0  Preparing Food and eating ? N  Using the Toilet? N  In the past six months, have you accidently leaked urine? N  Do you have problems with loss of bowel control? N  Managing your  Medications? N  Managing your Finances? N  Housekeeping  or managing your Housekeeping? N    Patient Care Team: Marin Olp, MD as PCP - General (Family Medicine) Jola Schmidt, MD as Consulting Physician (Ophthalmology)  Indicate any recent Medical Services you may have received from other than [facility] providers in the past year (date may be approximate).     Assessment:   This is a routine wellness examination for Jadan.  Hearing/Vision screen Hearing Screening - Comments:: Pt wears hearing aids  Vision Screening - Comments:: Pt follows up with [facility] opthalmology   Dietary issues and exercise activities discussed: Current Exercise Habits: The patient does not participate in regular exercise at present   Goals Addressed             This Visit's Progress    Patient Stated       None at this time        Depression Screen    04/13/2022   11:19 AM 10/14/2021   10:22 AM 06/08/2021    9:18 AM 04/08/2021   11:11 AM 06/09/2020    9:50 AM 04/09/2020    9:54 AM 01/26/2020    9:30 AM  PHQ 2/9 Scores  PHQ - 2 Score 0 0 0 0 0 0 0    Fall Risk    04/13/2022   11:21 AM 10/14/2021   10:22 AM 06/08/2021    9:18 AM 04/08/2021   11:16 AM 01/26/2021    9:32 AM  Fall Risk   Falls in the past year? 0 0 0 0 0  Number falls in past yr: 0 0 0 0 0  Injury with Fall? 0 0 0 0 0  Risk for fall due to : Impaired vision  No Fall Risks Impaired vision   Follow up Falls prevention discussed  Falls evaluation completed Falls prevention discussed     FALL RISK PREVENTION PERTAINING TO THE HOME:  Any stairs in or around the home? Yes  If so, are there any without handrails? No  Home free of loose throw rugs in walkways, pet beds, electrical cords, etc? Yes  Adequate lighting in your home to reduce risk of falls? Yes   ASSISTIVE DEVICES UTILIZED TO PREVENT FALLS:  Life alert? No  Use of a cane, walker or w/c? No  Grab bars in the bathroom? Yes  Shower chair or bench in shower? No  Elevated toilet seat or a handicapped toilet? No    TIMED UP AND GO:  Was the test performed? No .   Cognitive Function:        04/13/2022   11:22 AM 04/08/2021   11:18 AM  6CIT Screen  What Year? 0 points 0 points  What month? 0 points 0 points  What time? 0 points 0 points  Count back from 20 0 points 0 points  Months in reverse 0 points 0 points  Repeat phrase 0 points 0 points  Total Score 0 points 0 points    Immunizations Immunization History  Administered Date(s) Administered   Fluad Quad(high Dose 65+) 07/10/2019, 07/27/2020   Hepatitis B 02/27/2021, 03/30/2021   Influenza Split 07/31/2011   Influenza, High Dose Seasonal PF 09/01/2017   Influenza-Unspecified 07/16/2015, 08/14/2018, 09/14/2021   Moderna Sars-Covid-2 Vaccination 10/27/2019, 11/24/2019, 09/16/2020, 03/17/2021, 07/19/2021   Pneumococcal Conjugate-13 03/29/2017, 02/12/2018   Pneumococcal Polysaccharide-23 07/31/2011, 07/10/2019, 02/24/2020   Tdap 11/29/2015   Zoster Recombinat (Shingrix) 02/12/2018, 05/01/2018    TDAP status: Up to date  Flu Vaccine status: Up  to date  Pneumococcal vaccine status: Up to date  Covid-19 vaccine status: Completed vaccines  Qualifies for Shingles Vaccine? Yes   Zostavax completed Yes   Shingrix Completed?: Yes  Screening Tests Health Maintenance  Topic Date Due   OPHTHALMOLOGY EXAM  01/24/2022   COVID-19 Vaccine (6 - Moderna series) 04/27/2022 (Originally 09/13/2021)   INFLUENZA VACCINE  05/30/2022   HEMOGLOBIN A1C  10/11/2022   FOOT EXAM  04/12/2023   COLONOSCOPY (Pts 45-46yr Insurance coverage will need to be confirmed)  05/18/2025   TETANUS/TDAP  11/28/2025   Pneumonia Vaccine 72 Years old  Completed   Zoster Vaccines- Shingrix  Completed   Hepatitis C Screening  Addressed   HPV VACCINES  Aged Out    Health Maintenance  Health Maintenance Due  Topic Date Due   OPHTHALMOLOGY EXAM  01/24/2022    Colorectal cancer screening: Type of screening: Colonoscopy. Completed 05/18/20. Repeat every 5  years  Additional Screening:  Hepatitis C Screening:  Completed 11/29/15  Vision Screening: Recommended annual ophthalmology exams for early detection of glaucoma and other disorders of the eye. Is the patient up to date with their annual eye exam?  Yes  Who is the provider or what is the name of the office in which the patient attends annual eye exams? Dr BValetta Close If pt is not established with a provider, would they like to be referred to a provider to establish care? No .   Dental Screening: Recommended annual dental exams for proper oral hygiene  Community Resource Referral / Chronic Care Management: CRR required this visit?  No   CCM required this visit?  No      Plan:     I have personally reviewed and noted the following in the patient's chart:   Medical and social history Use of alcohol, tobacco or illicit drugs  Current medications and supplements including opioid prescriptions. Patient is not currently taking opioid prescriptions. Functional ability and status Nutritional status Physical activity Advanced directives List of other physicians Hospitalizations, surgeries, and ER visits in previous 12 months Vitals Screenings to include cognitive, depression, and falls Referrals and appointments  In addition, I have reviewed and discussed with patient certain preventive protocols, quality metrics, and best practice recommendations. A written personalized care plan for preventive services as well as general preventive health recommendations were provided to patient.     TWillette Brace LPN   [id number]  Nurse Notes: Pt stated he is having very dry mouth at night, he stated he doesn't sleep with mouth open so it's been a problem please advise

## 2022-04-17 ENCOUNTER — Other Ambulatory Visit: Payer: Self-pay | Admitting: Family Medicine

## 2022-04-21 ENCOUNTER — Other Ambulatory Visit: Payer: Self-pay | Admitting: Family Medicine

## 2022-05-02 ENCOUNTER — Other Ambulatory Visit: Payer: Self-pay | Admitting: Family Medicine

## 2022-05-15 ENCOUNTER — Other Ambulatory Visit: Payer: Self-pay | Admitting: Family Medicine

## 2022-05-24 ENCOUNTER — Other Ambulatory Visit: Payer: Self-pay | Admitting: Family Medicine

## 2022-05-25 ENCOUNTER — Encounter: Payer: Self-pay | Admitting: Family

## 2022-05-25 ENCOUNTER — Ambulatory Visit (INDEPENDENT_AMBULATORY_CARE_PROVIDER_SITE_OTHER): Payer: Medicare HMO | Admitting: Family

## 2022-05-25 VITALS — BP 126/80 | HR 76 | Temp 97.8°F | Ht 67.0 in | Wt 157.2 lb

## 2022-05-25 DIAGNOSIS — M25571 Pain in right ankle and joints of right foot: Secondary | ICD-10-CM | POA: Diagnosis not present

## 2022-05-25 MED ORDER — MELOXICAM 7.5 MG PO TABS: 7.5000 mg | ORAL_TABLET | Freq: Every day | ORAL | 0 refills | Status: DC

## 2022-05-25 NOTE — Patient Instructions (Addendum)
It was very nice to see you today!   I have sent over a referral to our podiatry office. They will call you directly to schedule.  I have also sent an anti-inflammatory pill, Meloxicam for you to take for the pain.  You can continue to take Tylenol 1,'000mg'$  3 times per day, but limit other NSAIDs (Ibuprofen= Advil, or Aleve).    PLEASE NOTE:  If you had any lab tests please let us know if you have not heard back within a few days. You may see your results on MyChart before we have a chance to review them but we will give you a call once they are reviewed by [place]. If we ordered any referrals today, please let us know if you have not heard from their office within the next week.

## 2022-05-25 NOTE — Progress Notes (Signed)
Patient ID: Preston Pennington, male    DOB: 1950/11/03, 71 y.o.   MRN: [id number]  Chief Complaint  Patient presents with   Ankle Pain    Pt c/o sharp pain in his right ankle for a few days. Has tried a cream which did not help at all for the pain. Pt states it mostly hurts when he walks.     HPI: Ankle pain:  right medial ankle, sharp pains when walking, no pain with rest. hx of several surgeries in the upper ankle, lower tibia region about 15 yrs ago, has had very limited range of motion since, but denies pain after surgery until now, but has been standing and walking more over the last week. Has applied diclofenac gel and taken Tylenol with little relief.   Assessment & Plan:  1. Acute right ankle pain hx of multiple surgeries years ago, just recent pain after standing more than usual, pt has very limited ROM since his surgery, sending Mobic qd, advised on use & SE, ok to also take Tylenol tid prn, also sending referral to podiatry.   - Ambulatory referral to Podiatry - meloxicam (MOBIC) 7.5 MG tablet; Take 1 tablet (7.5 mg total) by mouth daily.  Dispense: 30 tablet; Refill: 0   Subjective:    Outpatient Medications Prior to Visit  Medication Sig Dispense Refill   ACCU-CHEK GUIDE test strip USE 1 STRIP TO CHECK GLUCOSE 4 TIMES DAILY AS DIRECTED 100 each 0   Accu-Chek Softclix Lancets lancets 4 (four) times daily.     acetaminophen (TYLENOL) 500 MG tablet Take 1,000 mg by mouth every 6 (six) hours as needed for moderate pain.      allopurinol (ZYLOPRIM) 100 MG tablet Take 1 tablet (100 mg total) by mouth daily. 30 tablet 11   amLODipine (NORVASC) 10 MG tablet Take 1 tablet by mouth once daily 90 tablet 0   atorvastatin (LIPITOR) 10 MG tablet TAKE 1 TABLET BY MOUTH THREE TIMES A WEEK 30 tablet 0   diclofenac Sodium (VOLTAREN) 1 % GEL APPLY 2 GRAMS TOPICALLY TO AFFECTED AREA 4 TIMES DAILY 100 g 0   feeding supplement, ENSURE ENLIVE, (ENSURE ENLIVE) LIQD Take 1 Bottle by mouth daily.      gabapentin (NEURONTIN) 300 MG capsule TAKE 1 CAPSULE BY MOUTH IN THE MORNING AND AT BEDTIME 180 capsule 3   glipiZIDE (GLUCOTROL) 5 MG tablet TAKE 1 TABLET BY MOUTH TWICE DAILY BEFORE MEAL(S) 180 tablet 0   JARDIANCE 10 MG TABS tablet TAKE 1 TABLET BY MOUTH ONCE DAILY BEFORE BREAKFAST 90 tablet 0   latanoprost (XALATAN) 0.005 % ophthalmic solution Place 1 drop into both eyes at bedtime.      lisinopril (ZESTRIL) 20 MG tablet Take 1 tablet (20 mg total) by mouth daily. 90 tablet 3   metFORMIN (GLUCOPHAGE) 1000 MG tablet TAKE 1 TABLET BY MOUTH TWICE DAILY WITH MEALS 180 tablet 0   MITIGARE 0.6 MG CAPS TAKE 2 CAPSULES BY MOUTH AT FIRST SIGN OF GOUT. MAY ALSO TAKE 1 CAPSULE 2 HOURS LATER. THEN 1 CAPSULE DAILY UNTIL FLARE IS RESOLVED 30 capsule 0   Multiple Vitamin (MULTIVITAMIN ADULT PO) Take by mouth.     Polyethyl Glycol-Propyl Glycol (SYSTANE) 0.4-0.3 % SOLN Apply to eye. For dry eyes 3 x a day     Tetrahyd-Glyc-Hypro-PEG-ZnSulf (VISINE TOTALITY MULTI-SYMPTOM OP) Apply 1 drop to eye in the morning, at noon, and at bedtime.     No facility-administered medications prior to visit.   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 03/31/2019   Diabetes mellitus without complication ([place])    Difficult intubation    "mouth was not wide enough"   Dry eyes    History of adenomatous polyp of colon 11/06/2016   10/2016 adenoma x3 - recall colon 10/2019   Hyperlipidemia    Hypertension    Neuromuscular disorder ([place])    past hx tingling left arm    Prostate enlargement 2010   See's Urologist    Past Surgical History:  Procedure Laterality Date   COLONOSCOPY     COLONOSCOPY WITH PROPOFOL N/A 11/02/2016   Procedure: COLONOSCOPY WITH PROPOFOL;  Surgeon: Gatha Mayer, MD;  Location: [facility];  Service: Endoscopy;  Laterality: N/A;   COLONOSCOPY WITH PROPOFOL N/A 05/18/2020   Procedure: COLONOSCOPY WITH PROPOFOL;  Surgeon: Gatha Mayer, MD;  Location: [facility]  ENDOSCOPY;  Service: Endoscopy;  Laterality: N/A;   LEG SURGERY Right 1997   accident related   XI ROBOTIC ASSISTED SIMPLE PROSTATECTOMY N/A 10/05/2017   Procedure: XI ROBOTIC ASSISTED SIMPLE PROSTATECTOMY WITH UMBILICAL HERNIA REPAIR;  Surgeon: Cleon Gustin, MD;  Location: [facility];  Service: Urology;  Laterality: N/A;   No Known Allergies    Objective:    Physical Exam Vitals and nursing note reviewed.  Constitutional:      General: He is not in acute distress.    Appearance: Normal appearance.  HENT:     Head: Normocephalic.  Cardiovascular:     Rate and Rhythm: Normal rate and regular rhythm.  Pulmonary:     Effort: Pulmonary effort is normal.     Breath sounds: Normal breath sounds.  Musculoskeletal:     Cervical back: Normal range of motion.     Right ankle: Deformity present. No swelling. Tenderness present over the medial malleolus. Decreased range of motion.  Skin:    General: Skin is warm and dry.  Neurological:     Mental Status: He is alert and oriented to person, place, and time.  Psychiatric:        Mood and Affect: Mood normal.    BP 126/80 (BP Location: Left Arm, Patient Position: Sitting, Cuff Size: Large)   Pulse 76   Temp 97.8 F (36.6 C) (Temporal)   Ht '5\' 7"'$  (1.702 m)   Wt 157 lb 4 oz (71.3 kg)   SpO2 96%   BMI 24.63 kg/m  Wt Readings from Last 3 Encounters:  05/25/22 157 lb 4 oz (71.3 kg)  04/11/22 158 lb 9.6 oz (71.9 kg)  11/30/21 153 lb (69.4 kg)       Jeanie Sewer, NP

## 2022-05-26 DIAGNOSIS — H401131 Primary open-angle glaucoma, bilateral, mild stage: Secondary | ICD-10-CM | POA: Diagnosis not present

## 2022-05-26 DIAGNOSIS — H524 Presbyopia: Secondary | ICD-10-CM | POA: Diagnosis not present

## 2022-06-01 ENCOUNTER — Ambulatory Visit (INDEPENDENT_AMBULATORY_CARE_PROVIDER_SITE_OTHER): Payer: Medicare Other | Admitting: Podiatry

## 2022-06-01 ENCOUNTER — Ambulatory Visit (INDEPENDENT_AMBULATORY_CARE_PROVIDER_SITE_OTHER): Payer: Medicare Other

## 2022-06-01 DIAGNOSIS — M779 Enthesopathy, unspecified: Secondary | ICD-10-CM | POA: Diagnosis not present

## 2022-06-01 DIAGNOSIS — M76821 Posterior tibial tendinitis, right leg: Secondary | ICD-10-CM

## 2022-06-01 DIAGNOSIS — M7751 Other enthesopathy of right foot: Secondary | ICD-10-CM | POA: Diagnosis not present

## 2022-06-01 MED ORDER — TRIAMCINOLONE ACETONIDE 10 MG/ML IJ SUSP
10.0000 mg | Freq: Once | INTRAMUSCULAR | Status: AC
  Administered 2022-06-01: 10 mg

## 2022-06-02 NOTE — Progress Notes (Signed)
Subjective:   Patient ID: Preston Pennington, male   DOB: 71 y.o.   MRN: [id number]   HPI Patient presents with pain in the right ankle x2 weeks.  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

## 2022-06-05 ENCOUNTER — Ambulatory Visit (INDEPENDENT_AMBULATORY_CARE_PROVIDER_SITE_OTHER): Payer: Medicare Other | Admitting: Family Medicine

## 2022-06-05 ENCOUNTER — Encounter: Payer: Self-pay | Admitting: Family Medicine

## 2022-06-05 ENCOUNTER — Telehealth: Payer: Self-pay | Admitting: Family Medicine

## 2022-06-05 VITALS — BP 112/60 | HR 87 | Ht 67.0 in | Wt 158.4 lb

## 2022-06-05 DIAGNOSIS — R051 Acute cough: Secondary | ICD-10-CM | POA: Diagnosis not present

## 2022-06-05 DIAGNOSIS — B9689 Other specified bacterial agents as the cause of diseases classified elsewhere: Secondary | ICD-10-CM

## 2022-06-05 DIAGNOSIS — J208 Acute bronchitis due to other specified organisms: Secondary | ICD-10-CM | POA: Diagnosis not present

## 2022-06-05 LAB — POC COVID19 BINAXNOW: SARS Coronavirus 2 Ag: NEGATIVE

## 2022-06-05 MED ORDER — AZITHROMYCIN 250 MG PO TABS: ORAL_TABLET | ORAL | 0 refills | Status: DC

## 2022-06-05 MED ORDER — BENZONATATE 200 MG PO CAPS: 200.0000 mg | ORAL_CAPSULE | Freq: Two times a day (BID) | ORAL | 0 refills | Status: DC | PRN

## 2022-06-05 NOTE — Patient Instructions (Addendum)
Please follow up if symptoms do not improve or as needed.    Please take the antibiotic as prescribed BUT do NOT take the mitigare (gout pain medications) while taking the antibiotic.   You may use the cough capusulse to help treat the cough  Acute Bronchitis, Adult  Acute bronchitis is sudden inflammation of the main airways (bronchi) that come off the windpipe (trachea) in the lungs. The swelling causes the airways to get smaller and make more mucus than normal. This can make it hard to breathe and can cause coughing or noisy breathing (wheezing). Acute bronchitis may last several weeks. The cough may last longer. Allergies, asthma, and exposure to smoke may make the condition worse. What are the causes? This condition can be caused by germs and by substances that irritate the lungs, including: Cold and flu viruses. The most common cause of this condition is the virus that causes the common cold. Bacteria. This is less common. Breathing in substances that irritate the lungs, including: Smoke from cigarettes and other forms of tobacco. Dust and pollen. Fumes from household cleaning products, gases, or burned fuel. Indoor or outdoor air pollution. What increases the risk? The following factors may make you more likely to develop this condition: A weak body's defense system, also called the immune system. A condition that affects your lungs and breathing, such as asthma. What are the signs or symptoms? Common symptoms of this condition include: Coughing. This may bring up clear, yellow, or green mucus from your lungs (sputum). Wheezing. Runny or stuffy nose. Having too much mucus in your lungs (chest congestion). Shortness of breath. Aches and pains, including sore throat or chest. How is this diagnosed? This condition is usually diagnosed based on: Your symptoms and medical history. A physical exam. You may also have other tests, including tests to rule out other conditions, such  as pneumonia. These tests include: A test of lung function. Test of a mucus sample to look for the presence of bacteria. Tests to check the oxygen level in your blood. Blood tests. Chest X-ray. How is this treated? Most cases of acute bronchitis clear up over time without treatment. Your health care provider may recommend: Drinking more fluids to help thin your mucus so it is easier to cough up. Taking inhaled medicine (inhaler) to improve air flow in and out of your lungs. Using a vaporizer or a humidifier. These are machines that add water to the air to help you breathe better. Taking a medicine that thins mucus and clears congestion (expectorant). Taking a medicine that prevents or stops coughing (cough suppressant). It is not common to take an antibiotic medicine for this condition. Follow these instructions at home:  Take over-the-counter and prescription medicines only as told by your health care provider. Use an inhaler, vaporizer, or humidifier as told by your health care provider. Take two teaspoons (10 mL) of honey at bedtime to lessen coughing at night. Drink enough fluid to keep your urine pale yellow. Do not use any products that contain nicotine or tobacco. These products include cigarettes, chewing tobacco, and vaping devices, such as e-cigarettes. If you need help quitting, ask your health care provider. Get plenty of rest. Return to your normal activities as told by your health care provider. Ask your health care provider what activities are safe for you. Keep all follow-up visits. This is important. How is this prevented? To lower your risk of getting this condition again: Wash your hands often with soap and water for at  least 20 seconds. If soap and water are not available, use hand sanitizer. Avoid contact with people who have cold symptoms. Try not to touch your mouth, nose, or eyes with your hands. Avoid breathing in smoke or chemical fumes. Breathing smoke or  chemical fumes will make your condition worse. Get the flu shot every year. Contact a health care provider if: Your symptoms do not improve after 2 weeks. You have trouble coughing up the mucus. Your cough keeps you awake at night. You have a fever. Get help right away if you: Cough up blood. Feel pain in your chest. Have severe shortness of breath. Faint or keep feeling like you are going to faint. Have a severe headache. Have a fever or chills that get worse. These symptoms may represent a serious problem that is an emergency. Do not wait to see if the symptoms will go away. Get medical help right away. Call your local emergency services (911 in the [place] Do not drive yourself to the hospital. Summary Acute bronchitis is inflammation of the main airways (bronchi) that come off the windpipe (trachea) in the lungs. The swelling causes the airways to get smaller and make more mucus than normal. Drinking more fluids can help thin your mucus so it is easier to cough up. Take over-the-counter and prescription medicines only as told by your health care provider. Do not use any products that contain nicotine or tobacco. These products include cigarettes, chewing tobacco, and vaping devices, such as e-cigarettes. If you need help quitting, ask your health care provider. Contact a health care provider if your symptoms do not improve after 2 weeks. This information is not intended to replace advice given to you by your health care provider. Make sure you discuss any questions you have with your health care provider. Document Revised: 01/26/2022 Document Reviewed: 02/16/2021 Elsevier Patient Education  [place].

## 2022-06-05 NOTE — Telephone Encounter (Signed)
Patient states: - He was seen in office by Dr. Jonni Sanger on 08/07 - Benzonatate 200 mg was not approved by insurance upon picking up medications from pharmacy   Patient requests:  - A similar medication be sent in for him

## 2022-06-05 NOTE — Progress Notes (Signed)
Subjective  CC:  Chief Complaint  Patient presents with   Cough    Pt stated that he has had a cough for the past week and it has gotten worse.    Same day acute visit; PCP not available. New pt to me. Chart reviewed.   HPI: SUBJECTIVE:  Preston Pennington is a 71 y.o. male who complains of congestion, nasal blockage, post nasal drip, cough described as dry, nocturnal, and occ productive  and denies sinus, high fevers, SOB, chest pain or significant GI symptoms. Symptoms have been present for 7 days. He denies a history of anorexia, dizziness, vomiting and wheezing. He denies a history of asthma or COPD. Patient does not smoke cigarettes. Covid negative today in office Assessment  1. Acute bacterial bronchitis   2. Acute cough      Plan  Discussion:  Treat for bacterial bronchitis due to prolonged course and worsening symptoms. Education regarding differences between viral and bacterial infections and treatment options are discussed.  Supportive care measures are recommended.  We discussed the use of mucolytic's, decongestants, antihistamines and antitussives as needed.  Tylenol or Advil are recommended if needed.  Follow up: as needed    Orders Placed This Encounter  Procedures   POC COVID-19   Meds ordered this encounter  Medications   azithromycin (ZITHROMAX) 250 MG tablet    Sig: Take 2 tabs today, then 1 tab daily for 4 days    Dispense:  1 each    Refill:  0   benzonatate (TESSALON) 200 MG capsule    Sig: Take 1 capsule (200 mg total) by mouth 2 (two) times daily as needed for cough.    Dispense:  20 capsule    Refill:  0      I reviewed the patients updated PMH, FH, and SocHx.  Social History: Patient  reports that he has never smoked. He has never used smokeless tobacco. He reports that he does not drink alcohol and does not use drugs.  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 03/31/2019   Sesamoiditis 11/08/2018   Hyperlipidemia associated with type 2 diabetes mellitus ([place]) 07/09/2018   Osteoarthritis of spine with radiculopathy, cervical region 11/14/2017   Left cervical radiculopathy 09/24/2017   Right knee pain 11/21/2016   Hx of colonic polyps 11/06/2016   Chronic ankle pain 06/14/2016   Thrombocytopenia ([place]) 11/29/2015   Gout 04/30/2015   Erectile dysfunction 02/04/2015   Essential hypertension 04/09/2013   Diabetes mellitus type II, controlled ([place]) 04/09/2013   BPH (benign prostatic hyperplasia) 04/09/2013    Review of Systems: Cardiovascular: negative for chest pain Respiratory: negative for SOB or hemoptysis Gastrointestinal: negative for abdominal pain Genitourinary: negative for dysuria or gross hematuria Current Meds  Medication Sig   ACCU-CHEK GUIDE test strip USE 1 STRIP TO CHECK GLUCOSE 4 TIMES DAILY AS DIRECTED   Accu-Chek Softclix Lancets lancets 4 (four) times daily.   acetaminophen (TYLENOL) 500 MG tablet Take 1,000 mg by mouth every 6 (six) hours as needed for moderate pain.    allopurinol (ZYLOPRIM) 100 MG tablet Take 1 tablet (100 mg total) by mouth daily.   amLODipine (NORVASC) 10 MG tablet Take 1 tablet by mouth once daily   atorvastatin (LIPITOR) 10 MG tablet TAKE 1 TABLET  BY MOUTH THREE TIMES A WEEK   azithromycin (ZITHROMAX) 250 MG tablet Take 2 tabs today, then 1 tab daily for 4 days   benzonatate (TESSALON) 200 MG capsule Take 1 capsule (200 mg total) by mouth 2 (two) times daily as needed for cough.   diclofenac Sodium (VOLTAREN) 1 % GEL APPLY 2 GRAMS TOPICALLY TO AFFECTED AREA 4 TIMES DAILY   feeding supplement, ENSURE ENLIVE, (ENSURE ENLIVE) LIQD Take 1 Bottle by mouth daily.   gabapentin (NEURONTIN) 300 MG capsule TAKE 1 CAPSULE BY MOUTH IN THE MORNING AND AT BEDTIME    glipiZIDE (GLUCOTROL) 5 MG tablet TAKE 1 TABLET BY MOUTH TWICE DAILY BEFORE MEAL(S)   JARDIANCE 10 MG TABS tablet TAKE 1 TABLET BY MOUTH ONCE DAILY BEFORE BREAKFAST   latanoprost (XALATAN) 0.005 % ophthalmic solution Place 1 drop into both eyes at bedtime.    lisinopril (ZESTRIL) 20 MG tablet Take 1 tablet (20 mg total) by mouth daily.   meloxicam (MOBIC) 7.5 MG tablet Take 1 tablet (7.5 mg total) by mouth daily.   metFORMIN (GLUCOPHAGE) 1000 MG tablet TAKE 1 TABLET BY MOUTH TWICE DAILY WITH MEALS   MITIGARE 0.6 MG CAPS TAKE 2 CAPSULES BY MOUTH AT FIRST SIGN OF GOUT. MAY ALSO TAKE 1 CAPSULE 2 HOURS LATER. THEN 1 CAPSULE DAILY UNTIL FLARE IS RESOLVED   Multiple Vitamin (MULTIVITAMIN ADULT PO) Take by mouth.   Polyethyl Glycol-Propyl Glycol (SYSTANE) 0.4-0.3 % SOLN Apply to eye. For dry eyes 3 x a day   Tetrahyd-Glyc-Hypro-PEG-ZnSulf (VISINE TOTALITY MULTI-SYMPTOM OP) Apply 1 drop to eye in the morning, at noon, and at bedtime.    Objective  Vitals: BP 112/60   Pulse 87   Ht '5\' 7"'$  (1.702 m)   Wt 158 lb 6.4 oz (71.8 kg)   SpO2 95%   BMI 24.81 kg/m  General: no acute distress  Psych:  Alert and oriented, normal mood and affect HEENT:  Normocephalic, atraumatic, supple neck, moist mucous membranes, mildly erythematous pharynx without exudate, mild lymphadenopathy, supple neck Cardiovascular:  RRR without murmur. no edema Respiratory:  Good breath sounds bilaterally, CTAB with normal respiratory effort with occasional rhonchi Skin:  Warm, no rashes Neurologic:   Mental status is normal. normal gait  Commons side effects, risks, benefits, and alternatives for medications and treatment plan prescribed today were discussed, and the patient expressed understanding of the given instructions. Patient is instructed to call or message via MyChart if he/she has any questions or concerns regarding our treatment plan. No barriers to understanding were identified. We discussed Red Flag symptoms and signs  in detail. Patient expressed understanding regarding what to do in case of urgent or emergency type symptoms.  Medication list was reconciled, printed and provided to the patient in AVS. Patient instructions and summary information was reviewed with the patient as documented in the AVS. This note was prepared with assistance of Dragon voice recognition software. Occasional wrong-word or sound-a-like substitutions may have occurred due to the inherent limitations of voice recognition software

## 2022-06-06 DIAGNOSIS — H5213 Myopia, bilateral: Secondary | ICD-10-CM | POA: Diagnosis not present

## 2022-06-06 NOTE — Telephone Encounter (Signed)
Patient requests to be called at ph# [phone] for status of the medication problem (see message from 06/05/22)

## 2022-06-06 NOTE — Telephone Encounter (Signed)
See below, will Dr. Jonni Sanger send in something else?

## 2022-06-07 ENCOUNTER — Ambulatory Visit: Payer: Medicare Other | Admitting: Podiatry

## 2022-06-07 MED ORDER — PREDNISONE 20 MG PO TABS: ORAL_TABLET | ORAL | 0 refills | Status: DC

## 2022-06-07 NOTE — Telephone Encounter (Signed)
Pts insurance does not cover tessalon, Robitussin AC nor guaifenesin-promethazine.   Please call pt: Start pred '20mg'$  daily for 5 days to help with cough. Also can use OTC Delsym cough syrup twice a day.  thanks

## 2022-06-07 NOTE — Telephone Encounter (Signed)
Spoke with pt reagarding taking predisone '20mg'$  and he understood.

## 2022-06-08 ENCOUNTER — Other Ambulatory Visit: Payer: Self-pay | Admitting: Family Medicine

## 2022-06-09 ENCOUNTER — Other Ambulatory Visit: Payer: Self-pay | Admitting: Family Medicine

## 2022-06-12 ENCOUNTER — Other Ambulatory Visit: Payer: Self-pay | Admitting: Family Medicine

## 2022-06-13 ENCOUNTER — Encounter: Payer: Self-pay | Admitting: Family Medicine

## 2022-06-13 ENCOUNTER — Ambulatory Visit (INDEPENDENT_AMBULATORY_CARE_PROVIDER_SITE_OTHER)
Admission: RE | Admit: 2022-06-13 | Discharge: 2022-06-13 | Disposition: A | Discharge status: Home / Self Care | Payer: Medicare Other | Source: Ambulatory Visit | Attending: Family Medicine | Admitting: Family Medicine

## 2022-06-13 ENCOUNTER — Ambulatory Visit (INDEPENDENT_AMBULATORY_CARE_PROVIDER_SITE_OTHER): Payer: Medicare Other | Admitting: Family Medicine

## 2022-06-13 VITALS — BP 110/70 | HR 107 | Temp 98.4°F | Ht 67.0 in | Wt 157.4 lb

## 2022-06-13 DIAGNOSIS — E119 Type 2 diabetes mellitus without complications: Secondary | ICD-10-CM

## 2022-06-13 DIAGNOSIS — R079 Chest pain, unspecified: Secondary | ICD-10-CM | POA: Diagnosis not present

## 2022-06-13 DIAGNOSIS — R059 Cough, unspecified: Secondary | ICD-10-CM

## 2022-06-13 DIAGNOSIS — I10 Essential (primary) hypertension: Secondary | ICD-10-CM | POA: Diagnosis not present

## 2022-06-13 MED ORDER — AMOXICILLIN-POT CLAVULANATE 875-125 MG PO TABS: 1.0000 | ORAL_TABLET | Freq: Two times a day (BID) | ORAL | 0 refills | Status: DC

## 2022-06-13 NOTE — Progress Notes (Signed)
   Preston Pennington is a 71 y.o. male who presents today for an office visit.  Assessment/Plan:  New/Acute Problems: Cough No red flags.  Concern for possible infectious etiology given his constellation of symptoms.  He never started the azithromycin that was prescribed to him last week.  We will send a prescription for Augmentin.  We will also check x-ray to further evaluate.  No significant risk factors for PE though he is having some pleuritic chest pain that is likely due to his frequent cough and possibly underlying bronchitis or pneumonia.  If symptoms persist and x-rays negative may consider checking D-dimer versus CTA.  We discussed reasons to return to care and seek emergent care.  Chronic Problems Addressed Today: HTN -at goal today on lisinopril 20 mg daily T2DM -has been well controlled on current regimen of metformin 1000 twice daily and glipizide 5 mg daily.    Subjective:  HPI:  Patient here with cough.  This started a couple of weeks ago.  Associated symptoms include nasal congestion, postnasal drip, and blockage.  He was seen here a week ago and given a prescription for azithromycin and tessalon. HE never picked up the prescription for the azithromycin.   The pharmacist told him that there was an interaction. He took the tessalon and prednisone whcich did help.  Cough is productive of clear sputum.  He has not noticed any shortness of breath.  Does have pain with coughing and deep inspiration on the left side of his chest.  No exertional symptoms.       Objective:  Physical Exam: BP 110/70   Pulse (!) 107   Temp 98.4 F (36.9 C)   Ht '5\' 7"'$  (1.702 m)   Wt 157 lb 6.4 oz (71.4 kg)   SpO2 95%   BMI 24.65 kg/m   Gen: No acute distress, resting comfortably CV: Regular rate and rhythm with no murmurs appreciated Pulm: Normal work of breathing.  Bibasilar crackles noted left worse than right.  No rhonchi. Neuro: Grossly normal, moves all extremities Psych: Normal affect and  thought content       M. Jerline Pain, MD 06/13/2022 2:37 PM

## 2022-06-13 NOTE — Patient Instructions (Signed)
It was very nice to see you today!  Please start the Augmentin.  We will get a chest x-ray.  Let us know if your symptoms or not improving over the next few days.  Take care, Dr Jerline Pain  PLEASE NOTE:  If you had any lab tests please let us know if you have not heard back within a few days. You may see your results on mychart before we have a chance to review them but we will give you a call once they are reviewed by [place]. If we ordered any referrals today, please let us know if you have not heard from their office within the next week.   Please try these tips to maintain a healthy lifestyle:  Eat at least 3 REAL meals and 1-2 snacks per day.  Aim for no more than 5 hours between eating.  If you eat breakfast, please do so within one hour of getting up.   Each meal should contain half fruits/vegetables, one quarter protein, and one quarter carbs (no bigger than a computer mouse)  Cut down on sweet beverages. This includes juice, soda, and sweet tea.   Drink at least 1 glass of water with each meal and aim for at least 8 glasses per day  Exercise at least 150 minutes every week.

## 2022-06-14 ENCOUNTER — Ambulatory Visit: Payer: Medicare Other | Admitting: Family Medicine

## 2022-06-16 NOTE — Progress Notes (Signed)
Please inform patient of the following:  Chest xray is clear. No signs of infection. Would like for him to let us know if his symptoms are not improving.  Preston Pennington. Jerline Pain, MD 06/16/2022 8:06 AM

## 2022-06-22 DIAGNOSIS — Z0100 Encounter for examination of eyes and vision without abnormal findings: Secondary | ICD-10-CM | POA: Diagnosis not present

## 2022-06-29 ENCOUNTER — Encounter: Payer: Self-pay | Admitting: Family Medicine

## 2022-06-29 ENCOUNTER — Ambulatory Visit (INDEPENDENT_AMBULATORY_CARE_PROVIDER_SITE_OTHER): Payer: Medicare Other | Admitting: Family Medicine

## 2022-06-29 ENCOUNTER — Other Ambulatory Visit: Payer: Self-pay | Admitting: Family Medicine

## 2022-06-29 VITALS — BP 108/60 | HR 96 | Temp 98.7°F | Ht 67.0 in | Wt 154.8 lb

## 2022-06-29 DIAGNOSIS — M79671 Pain in right foot: Secondary | ICD-10-CM

## 2022-06-29 DIAGNOSIS — I10 Essential (primary) hypertension: Secondary | ICD-10-CM

## 2022-06-29 MED ORDER — TRAMADOL HCL 50 MG PO TABS: 50.0000 mg | ORAL_TABLET | Freq: Four times a day (QID) | ORAL | 0 refills | Status: DC | PRN

## 2022-06-29 NOTE — Progress Notes (Signed)
Phone [phone] In person visit   Subjective:   TRE SANKER is a 71 y.o. year old very pleasant male patient who presents for/with See problem oriented charting Chief Complaint  Patient presents with   Referral    Pt would like a referral to ortho due to right inner foot hurting.   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 03/31/2019    Priority: Medium    Hyperlipidemia associated with type 2 diabetes mellitus ([place]) 07/09/2018    Priority: Medium    Left cervical radiculopathy 09/24/2017    Priority: Medium    Hx of colonic polyps 11/06/2016    Priority: Medium    Chronic ankle pain 06/14/2016    Priority: Medium    Thrombocytopenia ([place]) 11/29/2015    Priority: Medium    Gout 04/30/2015    Priority: Medium    Essential hypertension 04/09/2013    Priority: Medium    BPH (benign prostatic hyperplasia) 04/09/2013    Priority: Medium    Leg length difference, acquired 06/20/2019    Priority: Low   Arthritis of first metatarsophalangeal (MTP) joint of right foot 05/30/2019    Priority: Low   Sesamoiditis 11/08/2018    Priority: Low   Osteoarthritis of spine with radiculopathy, cervical region 11/14/2017    Priority: Low   Right knee pain 11/21/2016    Priority: Low   Erectile dysfunction 02/04/2015    Priority: Low   Dupuytren's contracture of right hand 06/13/2021    Medications- reviewed and updated Current Outpatient Medications  Medication Sig Dispense Refill   ACCU-CHEK GUIDE test strip USE 1 STRIP TO CHECK GLUCOSE 4 TIMES DAILY AS DIRECTED 100 each 0   Accu-Chek Softclix Lancets lancets 4 (four) times daily.     acetaminophen (TYLENOL) 500 MG tablet Take 1,000 mg by mouth every 6 (six) hours as needed for moderate pain.      allopurinol (ZYLOPRIM) 100 MG tablet Take 1 tablet by mouth once daily  30 tablet 0   amLODipine (NORVASC) 10 MG tablet Take 1 tablet by mouth once daily 90 tablet 0   amoxicillin-clavulanate (AUGMENTIN) 875-125 MG tablet Take 1 tablet by mouth 2 (two) times daily. 20 tablet 0   atorvastatin (LIPITOR) 10 MG tablet TAKE 1 TABLET BY MOUTH THREE TIMES A WEEK 30 tablet 0   diclofenac Sodium (VOLTAREN) 1 % GEL APPLY 2 GRAMS TOPICALLY TO AFFECTED AREA 4 TIMES DAILY 100 g 0   feeding supplement, ENSURE ENLIVE, (ENSURE ENLIVE) LIQD Take 1 Bottle by mouth daily.     gabapentin (NEURONTIN) 300 MG capsule TAKE 1 CAPSULE BY MOUTH IN THE MORNING AND AT BEDTIME 60 capsule 0   glipiZIDE (GLUCOTROL) 5 MG tablet TAKE 1 TABLET BY MOUTH TWICE DAILY BEFORE MEAL(S) 180 tablet 0   JARDIANCE 10 MG TABS tablet TAKE 1 TABLET BY MOUTH ONCE DAILY BEFORE BREAKFAST 90 tablet 0   latanoprost (XALATAN) 0.005 % ophthalmic solution Place 1 drop into both eyes at bedtime.      lisinopril (ZESTRIL) 20 MG tablet Take 1 tablet (20 mg total) by mouth daily. 90 tablet 3   metFORMIN (GLUCOPHAGE) 1000 MG tablet TAKE 1 TABLET BY MOUTH TWICE DAILY WITH MEALS 180 tablet 0   MITIGARE 0.6 MG CAPS TAKE 2 CAPSULES BY MOUTH AT FIRST SIGN OF GOUT. MAY ALSO TAKE 1 CAPSULE  2 HOURS LATER. THEN 1 CAPSULE DAILY UNTIL FLARE IS RESOLVED 30 capsule 0   Multiple Vitamin (MULTIVITAMIN ADULT PO) Take by mouth.     Polyethyl Glycol-Propyl Glycol (SYSTANE) 0.4-0.3 % SOLN Apply to eye. For dry eyes 3 x a day     Tetrahyd-Glyc-Hypro-PEG-ZnSulf (VISINE TOTALITY MULTI-SYMPTOM OP) Apply 1 drop to eye in the morning, at noon, and at bedtime.     traMADol (ULTRAM) 50 MG tablet Take 1 tablet (50 mg total) by mouth every 6 (six) hours as needed for moderate pain or severe pain (chronic foot pain. do not drive for 8 hours after taking.). 20 tablet 0   No current facility-administered medications for this visit.     Objective:  BP 108/60   Pulse 96   Temp 98.7 F (37.1 C)   Ht '5\' 7"'$  (1.702 m)   Wt 154 lb 12.8 oz (70.2 kg)   SpO2  97%   BMI 24.25 kg/m  Gen: NAD, resting comfortably CV: RRR no murmurs rubs or gallops Lungs: CTAB no crackles, wheeze, rhonchi  Ext: no edema Skin: warm, dry MSK: surgical scars and limited range of motion in right foot- wears raised shoe with orthotics on that side. Pain on right medial side of forefoot    Assessment and Plan   #Right foot pain S: Patient was seen on 06/01/2022 by Dr. Paulla Dolly of podiatry-notes mentioned right ankle pain for 2 weeks-she had an injection of steroids-they did not think surgery was an option due to baseline deformity.  X-ray showed severe arthritis  Has tried voltaren gel regularly without relief. Has not tried tylenol arthritis. Also in late July was given meloxicam by Jeanie Sewer, NP- he has finished. Prednisone 20 mg for bronchitis for 5 days from Dr. Jonni Sanger earlier this month did not help either.  A/P: Chronic right foot pain after prior complex surgery-he trialed an injection with podiatry without significant improvement-has also tried topical NSAIDs, oral NSAIDs, prednisone without relief - Will refer to orthopedics Dr. Doran Durand for his opinion-options may be limited - Also discussed pain control as below - "Trial tylenol arthritis 1-2 tablets every 8 hours -if this doesn't work can try tramadol (you tried this in 2015 as well) but no driving for 8 hours after taking"  -not hot/swollen- doubt gout plus didn't respond to steroid injeciton or prednisone  #hypertension S: compliant with Amlodipine 10 mg once daily and lisinopril 20 mg daily.  BP Readings from Last 3 Encounters:  06/29/22 108/60  06/13/22 110/70  06/05/22 112/60  A/P: Controlled. Continue current medications.   Recommended follow up: Return for next already scheduled visit or sooner if needed. Future Appointments  Date Time Provider [place]  10/11/2022  9:20 AM Marin Olp, MD [facility]  11/17/2022  2:00 PM Marin Olp, MD [facility]  04/20/2023 11:00 AM  [facility] COACH [facility]   Lab/Order associations:   ICD-10-CM   1. Right foot pain  M79.671 Ambulatory referral to Orthopedics    2. Essential hypertension  I10      Meds ordered this encounter  Medications   traMADol (ULTRAM) 50 MG tablet    Sig: Take 1 tablet (50 mg total) by mouth every 6 (six) hours as needed for moderate pain or severe pain (chronic foot pain. do not drive for 8 hours after taking.).    Dispense:  20 tablet    Refill:  0    Return precautions advised.  Garret Reddish, MD

## 2022-06-29 NOTE — Patient Instructions (Addendum)
Flu shot- we should have these available within a month or two but please let us know if you get at outside pharmacy (high dose)  Trial tylenol arthritis 1-2 tablets every 8 hours -if this doesn't work can try tramadol (you tried this in 2015 as well) but no driving for 8 hours after taking  We will call you within two weeks about your referral to orthopedics Dr. Doran Durand. If you do not hear within 2 weeks, give [place] a call.   You did your wellness on 04/13/22 if your insurance asks  Recommended follow up: Return for next already scheduled visit or sooner if needed.

## 2022-07-03 ENCOUNTER — Other Ambulatory Visit: Payer: Self-pay | Admitting: Family Medicine

## 2022-07-06 ENCOUNTER — Other Ambulatory Visit: Payer: Self-pay

## 2022-07-06 MED ORDER — MITIGARE 0.6 MG PO CAPS: ORAL_CAPSULE | ORAL | 2 refills | Status: DC

## 2022-07-12 DIAGNOSIS — M19071 Primary osteoarthritis, right ankle and foot: Secondary | ICD-10-CM | POA: Diagnosis not present

## 2022-07-12 DIAGNOSIS — M79671 Pain in right foot: Secondary | ICD-10-CM | POA: Diagnosis not present

## 2022-07-12 DIAGNOSIS — M25571 Pain in right ankle and joints of right foot: Secondary | ICD-10-CM | POA: Diagnosis not present

## 2022-07-13 ENCOUNTER — Other Ambulatory Visit: Payer: Self-pay | Admitting: Family Medicine

## 2022-07-13 ENCOUNTER — Other Ambulatory Visit: Payer: Self-pay | Admitting: Student

## 2022-07-13 DIAGNOSIS — M25571 Pain in right ankle and joints of right foot: Secondary | ICD-10-CM

## 2022-07-17 ENCOUNTER — Other Ambulatory Visit: Payer: Medicare Other

## 2022-07-21 ENCOUNTER — Ambulatory Visit
Admission: RE | Admit: 2022-07-21 | Discharge: 2022-07-21 | Disposition: A | Discharge status: Home / Self Care | Payer: Medicare Other | Source: Ambulatory Visit | Attending: Student | Admitting: Student

## 2022-07-21 ENCOUNTER — Other Ambulatory Visit: Payer: Self-pay | Admitting: Family Medicine

## 2022-07-21 DIAGNOSIS — Q742 Other congenital malformations of lower limb(s), including pelvic girdle: Secondary | ICD-10-CM | POA: Diagnosis not present

## 2022-07-21 DIAGNOSIS — M19071 Primary osteoarthritis, right ankle and foot: Secondary | ICD-10-CM | POA: Diagnosis not present

## 2022-07-21 DIAGNOSIS — G8929 Other chronic pain: Secondary | ICD-10-CM | POA: Diagnosis not present

## 2022-07-21 DIAGNOSIS — M25571 Pain in right ankle and joints of right foot: Secondary | ICD-10-CM

## 2022-07-21 DIAGNOSIS — M21961 Unspecified acquired deformity of right lower leg: Secondary | ICD-10-CM | POA: Diagnosis not present

## 2022-07-24 ENCOUNTER — Other Ambulatory Visit: Payer: Self-pay

## 2022-07-24 MED ORDER — MITIGARE 0.6 MG PO CAPS: ORAL_CAPSULE | ORAL | 5 refills | Status: DC

## 2022-07-25 ENCOUNTER — Other Ambulatory Visit: Payer: Self-pay

## 2022-07-25 MED ORDER — COLCHICINE 0.6 MG PO TABS: ORAL_TABLET | ORAL | 2 refills | Status: DC

## 2022-07-27 ENCOUNTER — Other Ambulatory Visit: Payer: Self-pay | Admitting: Family Medicine

## 2022-07-28 DIAGNOSIS — M19071 Primary osteoarthritis, right ankle and foot: Secondary | ICD-10-CM | POA: Diagnosis not present

## 2022-08-08 ENCOUNTER — Other Ambulatory Visit: Payer: Self-pay | Admitting: Family Medicine

## 2022-08-22 DIAGNOSIS — M19071 Primary osteoarthritis, right ankle and foot: Secondary | ICD-10-CM | POA: Diagnosis not present

## 2022-08-23 ENCOUNTER — Ambulatory Visit (INDEPENDENT_AMBULATORY_CARE_PROVIDER_SITE_OTHER): Payer: Medicare Other | Admitting: Internal Medicine

## 2022-08-23 ENCOUNTER — Encounter: Payer: Self-pay | Admitting: Internal Medicine

## 2022-08-23 VITALS — BP 120/72 | HR 85 | Temp 98.0°F | Resp 14 | Ht 67.0 in | Wt 159.6 lb

## 2022-08-23 DIAGNOSIS — N63 Unspecified lump in unspecified breast: Secondary | ICD-10-CM | POA: Diagnosis not present

## 2022-08-23 DIAGNOSIS — I889 Nonspecific lymphadenitis, unspecified: Secondary | ICD-10-CM

## 2022-08-23 MED ORDER — DOXYCYCLINE HYCLATE 100 MG PO TABS: 100.0000 mg | ORAL_TABLET | Freq: Two times a day (BID) | ORAL | 0 refills | Status: DC

## 2022-08-23 NOTE — Patient Instructions (Signed)
It was a pleasure seeing you today!  Today the plan is...  Subcutaneous nodule of breast -     Ambulatory referral to Breast Clinic -     [place] Unlisted Procedure Breast; Future -     MM Digital Diagnostic Unilat L; Future  Lymphadenitis -     Doxycycline Hyclate; Take 1 tablet (100 mg total) by mouth 2 (two) times daily.  Dispense: 20 tablet; Refill: 0     Preston Pacas, MD   No follow-ups on file.  If you are not doing as well as expected, call and return to the office sooner If your condition begins to worsen or become severe:  go to the ER If you have follow-up questions / concerns: please contact me via phone [phone] or MyChart messaging Please bring all your medicines to your next appointment. This is the best way for me to know exactly what you're taking.    IF you received an x-ray today, you will receive an invoice from [facility]. Please contact [facility] at [phone] with questions or concerns regarding your invoice.    IF you received labwork today, you will receive an invoice from [facility]. Please contact [facility] at [phone] with questions or concerns regarding your invoice.    Our billing staff will not be able to assist you with questions regarding bills from these companies.   --------------------------------------------------------------------------------------------------------------------  You will be contacted with the lab results as soon as they are available. The fastest way to get your results is to activate your My Chart account. Instructions are located on the last page of this paperwork. If you have not heard from [place] regarding the results in 2 weeks, please contact this office. For any labs or imaging tests, we will call you if the results are significantly abnormal.  Most normal results will be posted to myChart as soon as they are available and I will comment on them there within 2-3 business days.

## 2022-08-23 NOTE — Progress Notes (Signed)
[place] at [employer]:  [phone]   Routine Medical Office Visit  Patient:  Preston Pennington      Age: 71 y.o.       Sex:  male  Date:   08/23/2022  PCP:    Marin Olp, MD    [place] Provider: Loralee Pacas, MD  Assessment/Plan:    Preston Pennington was seen today for body weakness.  Subcutaneous nodule of breast -     Ambulatory referral to Breast Clinic -     [place] Unlisted Procedure Breast; Future -     MM Digital Diagnostic Unilat L; Future  Lymphadenitis -     Doxycycline Hyclate; Take 1 tablet (100 mg total) by mouth 2 (two) times daily.  Dispense: 20 tablet; Refill: 0  I arranged urgent referral to get mammography breast ultrasound and breast clinic possible biopsy and I wrote antibiotic even though I do not think it is an infection just in case it is an infected lymph node it might go away it might save him a biopsy so we will try doxycycline for a few days while waiting on the stuff.  Share my plan with Dr. Yong Channel and let him decide about follow-up but it will probably be taken over by the breast clinic.  Differential is primarily neoplastic possible malignancy vs infectious etiology based on today's exam.   MDM:  Today's visit we focused on 1 new undiagnosed problem with uncertain prognosis and potentially serious differential diagnostic considerations, and provided prescription drug management for possible infection.     Today's key discussion points - also in After Visit Summary (AVS) Common side effects, risks, benefits, and alternatives for medications and treatment plan prescribed today were discussed, and he expressed understanding of the given instructions.  Advised plenty of fluid with doxycycline. He was encouraged to contact our office by phone or message via MyChart if he has any questions or concerns regarding our treatment plan (see AVS).  Barriers to understanding were identified: english 2nd language, possibly hearing. We discussed red  flag symptoms and signs in detail and when to call the office or go to ER if his condition worsens (see AFTER VISIT SUMMARY).. He expressed understanding.    Subjective:   Preston Pennington is a 71 y.o. 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     Additional physician collected history: See Assessment/Plan section for per problem updates to history (overview and a/p subsections) as reported by patient today. He reports painful tender hard nodule under the left  breast just medial to the left nipple Denies any history of cancer any recent weight loss any history of smoking denies any trouble swallowing Cannot tell if it is getting any bigger he just noticed it 2 days  ago due to some pain/discomfort there He does have a keloid scar nearby over sternum but has been there 20 years he says it hasn't had skin break there He did have some antibiotics for some pain in the lung 2-3 months ago  Review of Systems  Constitutional:  Negative for chills, diaphoresis, fever, malaise/fatigue and weight loss.  HENT:  Negative for congestion, ear discharge, ear pain, hearing loss, nosebleeds, sinus pain, sore throat and tinnitus.   Eyes:  Negative for blurred vision, double vision, photophobia, pain, discharge and redness.  Respiratory:  Negative for cough, hemoptysis, sputum production, shortness of  breath, wheezing and stridor.   Cardiovascular:  Positive for chest pain (tnederness in breast nodule). Negative for palpitations, orthopnea, claudication, leg swelling and PND.  Gastrointestinal:  Negative for abdominal pain, blood in stool, constipation, diarrhea, heartburn, melena, nausea and vomiting.  Genitourinary:  Negative for dysuria, flank pain, frequency, hematuria and urgency.  Musculoskeletal:  Positive for joint pain (in ankle, had steroid injection yesterday, chronic). Negative for back pain, falls, myalgias and neck pain.  Skin:  Negative for itching and rash.  Neurological:  Positive for weakness (reported to nurse, but denied on ROS). Negative for dizziness, tingling, tremors, sensory change, speech change, focal weakness, seizures, loss of consciousness and headaches.  Endo/Heme/Allergies:  Negative for environmental allergies and polydipsia. Does not bruise/bleed easily.  Psychiatric/Behavioral:  Negative for depression, hallucinations, memory loss, substance abuse and suicidal ideas. The patient is not nervous/anxious and does not have insomnia.          Objective:  Physical Exam: BP 120/72 (BP Location: Left Arm, Patient Position: Sitting)   Pulse 85   Temp 98 F (36.7 C) (Temporal)   Resp 14   Ht '5\' 7"'$  (1.702 m)   Wt 159 lb 9.6 oz (72.4 kg)   SpO2 94%   BMI 25.00 kg/m   He is a polite, friendly, and genuine person Constitutional: NAD, AAO, not ill-appearing  Neuro: alert, no focal deficit obvious, articulate speech Psych: normal mood, behavior, thought content   Problem specific physical exam findings:  Hard tender irregular lump feels almost like a cord with nodules under medial left nipple and towards sternum No overlying skin changes There is no palpable lymphadenopathy in his left axillary region or in his cervical region bilaterally no supraclavicular lymph node can be felt   Results:  No results found for any visits on 08/23/22.   Recent Results  (from the past 2160 hour(s))  POC COVID-19     Status: Normal   Collection Time: 06/05/22 11:36 AM  Result Value Ref Range   SARS Coronavirus 2 Ag Negative Negative

## 2022-08-24 NOTE — Addendum Note (Signed)
Addended byHildred Alamin on: 08/24/2022 02:21 PM   Modules accepted: Orders

## 2022-08-24 NOTE — Addendum Note (Signed)
Addended byHildred Alamin on: 08/24/2022 12:09 PM   Modules accepted: Orders

## 2022-08-26 ENCOUNTER — Other Ambulatory Visit: Payer: Self-pay | Admitting: Internal Medicine

## 2022-08-26 ENCOUNTER — Ambulatory Visit
Admission: RE | Admit: 2022-08-26 | Discharge: 2022-08-26 | Disposition: A | Discharge status: Home / Self Care | Payer: Medicare Other | Source: Ambulatory Visit | Attending: Internal Medicine | Admitting: Internal Medicine

## 2022-08-26 DIAGNOSIS — N63 Unspecified lump in unspecified breast: Secondary | ICD-10-CM

## 2022-08-26 DIAGNOSIS — R92313 Mammographic fatty tissue density, bilateral breasts: Secondary | ICD-10-CM | POA: Diagnosis not present

## 2022-08-26 DIAGNOSIS — N6320 Unspecified lump in the left breast, unspecified quadrant: Secondary | ICD-10-CM

## 2022-08-26 DIAGNOSIS — N641 Fat necrosis of breast: Secondary | ICD-10-CM | POA: Diagnosis not present

## 2022-09-01 ENCOUNTER — Other Ambulatory Visit: Payer: Self-pay | Admitting: Family Medicine

## 2022-09-05 ENCOUNTER — Telehealth: Payer: Self-pay | Admitting: Family Medicine

## 2022-09-05 NOTE — Telephone Encounter (Signed)
Pt States: -He wants to let RM,MD know that he completed the course of antibiotics prescribed on 08/23/22 -He was instructed to go back to other doctor at 6 weeks.   Pt asks: -What is the next step he should be taking after finishing the antibiotic?   Pt requests: -response through MyChart

## 2022-09-06 ENCOUNTER — Other Ambulatory Visit: Payer: Self-pay

## 2022-09-06 ENCOUNTER — Other Ambulatory Visit: Payer: Self-pay | Admitting: Family Medicine

## 2022-09-06 MED ORDER — DICLOFENAC SODIUM 1 % EX GEL: CUTANEOUS | 3 refills | Status: DC

## 2022-09-06 NOTE — Telephone Encounter (Signed)
Return to see [place] if symptoms worsen-otherwise keep follow-up appointment with the breast center on December 5 to repeat imaging

## 2022-09-06 NOTE — Telephone Encounter (Signed)
FYI

## 2022-09-12 ENCOUNTER — Other Ambulatory Visit: Payer: Self-pay | Admitting: Family Medicine

## 2022-09-24 ENCOUNTER — Other Ambulatory Visit: Payer: Self-pay | Admitting: Family Medicine

## 2022-10-02 ENCOUNTER — Other Ambulatory Visit: Payer: Self-pay | Admitting: Family Medicine

## 2022-10-03 ENCOUNTER — Ambulatory Visit
Admission: RE | Admit: 2022-10-03 | Discharge: 2022-10-03 | Disposition: A | Discharge status: Home / Self Care | Payer: Medicare Other | Source: Ambulatory Visit | Attending: Internal Medicine | Admitting: Internal Medicine

## 2022-10-03 ENCOUNTER — Other Ambulatory Visit: Payer: Self-pay | Admitting: Internal Medicine

## 2022-10-03 DIAGNOSIS — N6320 Unspecified lump in the left breast, unspecified quadrant: Secondary | ICD-10-CM

## 2022-10-03 DIAGNOSIS — N641 Fat necrosis of breast: Secondary | ICD-10-CM | POA: Diagnosis not present

## 2022-10-05 DIAGNOSIS — H401112 Primary open-angle glaucoma, right eye, moderate stage: Secondary | ICD-10-CM | POA: Diagnosis not present

## 2022-10-05 DIAGNOSIS — E119 Type 2 diabetes mellitus without complications: Secondary | ICD-10-CM | POA: Diagnosis not present

## 2022-10-05 LAB — HM DIABETES EYE EXAM

## 2022-10-06 ENCOUNTER — Encounter: Payer: Self-pay | Admitting: Family Medicine

## 2022-10-11 ENCOUNTER — Ambulatory Visit: Payer: Medicare HMO | Admitting: Family Medicine

## 2022-10-16 ENCOUNTER — Other Ambulatory Visit: Payer: Self-pay | Admitting: Family Medicine

## 2022-10-21 ENCOUNTER — Other Ambulatory Visit: Payer: Self-pay | Admitting: Family Medicine

## 2022-10-31 ENCOUNTER — Ambulatory Visit: Payer: Medicare Other | Admitting: Family Medicine

## 2022-11-04 ENCOUNTER — Other Ambulatory Visit: Payer: Self-pay | Admitting: Family Medicine

## 2022-11-08 ENCOUNTER — Encounter: Payer: Self-pay | Admitting: Family Medicine

## 2022-11-08 ENCOUNTER — Ambulatory Visit (INDEPENDENT_AMBULATORY_CARE_PROVIDER_SITE_OTHER): Payer: Medicare Other | Admitting: Family Medicine

## 2022-11-08 VITALS — BP 108/70 | HR 80 | Temp 97.3°F | Ht 67.0 in | Wt 158.6 lb

## 2022-11-08 DIAGNOSIS — E1169 Type 2 diabetes mellitus with other specified complication: Secondary | ICD-10-CM | POA: Diagnosis not present

## 2022-11-08 DIAGNOSIS — D696 Thrombocytopenia, unspecified: Secondary | ICD-10-CM

## 2022-11-08 DIAGNOSIS — E785 Hyperlipidemia, unspecified: Secondary | ICD-10-CM

## 2022-11-08 DIAGNOSIS — I10 Essential (primary) hypertension: Secondary | ICD-10-CM

## 2022-11-08 DIAGNOSIS — E119 Type 2 diabetes mellitus without complications: Secondary | ICD-10-CM

## 2022-11-08 LAB — MICROALBUMIN / CREATININE URINE RATIO
Creatinine,U: 44.1 mg/dL
Microalb Creat Ratio: 1.6 mg/g (ref 0.0–30.0)
Microalb, Ur: <0.7 mg/dL (ref 0.0–1.9)

## 2022-11-08 LAB — CBC WITH DIFFERENTIAL/PLATELET
Basophils Absolute: 0 10*3/uL (ref 0.0–0.1)
Basophils Relative: 0.5 % (ref 0.0–3.0)
Eosinophils Absolute: 0.2 10*3/uL (ref 0.0–0.7)
Eosinophils Relative: 5 % (ref 0.0–5.0)
HCT: 45.4 % (ref 39.0–52.0)
Hemoglobin: 14.5 g/dL (ref 13.0–17.0)
Lymphocytes Relative: 44.9 % (ref 12.0–46.0)
Lymphs Abs: 1.7 10*3/uL (ref 0.7–4.0)
MCHC: 32 g/dL (ref 30.0–36.0)
MCV: 82.2 fl (ref 78.0–100.0)
Monocytes Absolute: 0.4 10*3/uL (ref 0.1–1.0)
Monocytes Relative: 10.3 % (ref 3.0–12.0)
Neutro Abs: 1.5 10*3/uL (ref 1.4–7.7)
Neutrophils Relative %: 39.3 % — ABNORMAL LOW (ref 43.0–77.0)
Platelets: 97 10*3/uL — ABNORMAL LOW (ref 150.0–400.0)
RBC: 5.52 Mil/uL (ref 4.22–5.81)
RDW: 13.8 % (ref 11.5–15.5)
WBC: 3.7 10*3/uL — ABNORMAL LOW (ref 4.0–10.5)

## 2022-11-08 LAB — COMPREHENSIVE METABOLIC PANEL
ALT: 31 U/L (ref 0–53)
AST: 25 U/L (ref 0–37)
Albumin: 4.2 g/dL (ref 3.5–5.2)
Alkaline Phosphatase: 64 U/L (ref 39–117)
BUN: 23 mg/dL (ref 6–23)
CO2: 25 mEq/L (ref 19–32)
Calcium: 9.3 mg/dL (ref 8.4–10.5)
Chloride: 105 mEq/L (ref 96–112)
Creatinine, Ser: 1 mg/dL (ref 0.40–1.50)
GFR: 75.31 mL/min (ref >60.00)
Glucose, Bld: 170 mg/dL — ABNORMAL HIGH (ref 70–99)
Potassium: 4.5 mEq/L (ref 3.5–5.1)
Sodium: 140 mEq/L (ref 135–145)
Total Bilirubin: 0.5 mg/dL (ref 0.2–1.2)
Total Protein: 6.6 g/dL (ref 6.0–8.3)

## 2022-11-08 LAB — HEMOGLOBIN A1C: Hgb A1c MFr Bld: 6.4 % (ref 4.6–6.5)

## 2022-11-08 MED ORDER — METFORMIN HCL 1000 MG PO TABS: 1000.0000 mg | ORAL_TABLET | Freq: Two times a day (BID) | ORAL | 3 refills | Status: DC

## 2022-11-08 MED ORDER — AMLODIPINE BESYLATE 5 MG PO TABS: 10.0000 mg | ORAL_TABLET | Freq: Every day | ORAL | 3 refills | Status: DC

## 2022-11-08 MED ORDER — ALLOPURINOL 100 MG PO TABS: 100.0000 mg | ORAL_TABLET | Freq: Every day | ORAL | 3 refills | Status: DC

## 2022-11-08 MED ORDER — LISINOPRIL 20 MG PO TABS: 20.0000 mg | ORAL_TABLET | Freq: Every day | ORAL | 3 refills | Status: DC

## 2022-11-08 MED ORDER — GABAPENTIN 300 MG PO CAPS: ORAL_CAPSULE | ORAL | 3 refills | Status: DC

## 2022-11-08 MED ORDER — ATORVASTATIN CALCIUM 10 MG PO TABS: 10.0000 mg | ORAL_TABLET | ORAL | 3 refills | Status: DC

## 2022-11-08 MED ORDER — GLIPIZIDE 5 MG PO TABS: ORAL_TABLET | ORAL | 3 refills | Status: DC

## 2022-11-08 MED ORDER — EMPAGLIFLOZIN 10 MG PO TABS: 10.0000 mg | ORAL_TABLET | Freq: Every day | ORAL | 3 refills | Status: DC

## 2022-11-08 NOTE — Progress Notes (Signed)
Phone [phone] In person visit   Subjective:   Preston Pennington is a 72 y.o. 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 03/31/2019    Priority: Medium    Hyperlipidemia associated with type 2 diabetes mellitus ([place]) 07/09/2018    Priority: Medium    Left cervical radiculopathy 09/24/2017    Priority: Medium    Hx of colonic polyps 11/06/2016    Priority: Medium    Chronic ankle pain 06/14/2016    Priority: Medium    Thrombocytopenia ([place]) 11/29/2015    Priority: Medium    Gout 04/30/2015    Priority: Medium    Essential hypertension 04/09/2013    Priority: Medium    BPH (benign prostatic hyperplasia) 04/09/2013    Priority: Medium    Leg length difference, acquired 06/20/2019    Priority: Low   Arthritis of first metatarsophalangeal (MTP) joint of right foot 05/30/2019    Priority: Low   Sesamoiditis 11/08/2018    Priority: Low   Osteoarthritis of spine with radiculopathy, cervical region 11/14/2017    Priority: Low   Right knee pain 11/21/2016    Priority: Low   Erectile dysfunction 02/04/2015    Priority: Low   Dupuytren's contracture of right hand 06/13/2021    Medications- reviewed and updated Current Outpatient Medications  Medication Sig Dispense Refill   ACCU-CHEK GUIDE test strip USE 1  4 TIMES DAILY AS DIRECTED 100 each 0   Accu-Chek Softclix Lancets lancets 4 (four) times daily.     acetaminophen (TYLENOL) 500 MG tablet Take 1,000 mg by mouth every 6 (six) hours as needed for moderate pain.      colchicine (COLCRYS) 0.6 MG tablet TAKE 2 CAPSULES BY MOUTH AT THE FIRST SIGN OF GOUT THEN TAKE 1 CAPSULE 2 HOURS LATER THEN 1 CAPSULE  DAILY UNTIL FLARE IS RESOLVED 60 tablet 2   diclofenac Sodium (VOLTAREN) 1 % GEL APPLY 2 GRAMS TOPICALLY TO THE AFFECTED AREA 4 TIMES DAILY 100 g 3   feeding supplement, ENSURE ENLIVE, (ENSURE ENLIVE) LIQD Take 1 Bottle by mouth daily.     latanoprost (XALATAN) 0.005 % ophthalmic solution Place 1 drop into both eyes at bedtime.      Multiple Vitamin (MULTIVITAMIN ADULT PO) Take by mouth.     Polyethyl Glycol-Propyl Glycol (SYSTANE) 0.4-0.3 % SOLN Apply to eye. For dry eyes 3 x a day     Tetrahyd-Glyc-Hypro-PEG-ZnSulf (VISINE TOTALITY MULTI-SYMPTOM OP) Apply 1 drop to eye in the morning, at noon, and at bedtime.     traMADol (ULTRAM) 50 MG tablet Take 1 tablet (50 mg total) by mouth every 6 (six) hours as needed for moderate pain or severe pain (chronic foot pain. do not drive for 8 hours after taking.). 20 tablet 0   allopurinol (ZYLOPRIM) 100 MG tablet Take 1 tablet (100 mg total) by mouth daily. 90 tablet 3   amLODipine (NORVASC) 5 MG tablet Take 2 tablets (10 mg total) by mouth daily. 90 tablet 3   atorvastatin (LIPITOR) 10 MG tablet Take 1 tablet (10 mg total) by mouth 3 (three) times a week. 40 tablet 3   empagliflozin (JARDIANCE) 10 MG TABS tablet Take  1 tablet (10 mg total) by mouth daily before breakfast. 90 tablet 3   gabapentin (NEURONTIN) 300 MG capsule TAKE 1 CAPSULE BY MOUTH IN THE MORNING AND AT BEDTIME 120 capsule 3   glipiZIDE (GLUCOTROL) 5 MG tablet TAKE 1 TABLET BY MOUTH TWICE DAILY BEFORE MEAL(S) 180 tablet 3   lisinopril (ZESTRIL) 20 MG tablet Take 1 tablet (20 mg total) by mouth daily. 90 tablet 3   metFORMIN (GLUCOPHAGE) 1000 MG tablet Take 1 tablet (1,000 mg total) by mouth 2 (two) times daily with a meal. 180 tablet 3   No current facility-administered medications for this visit.     Objective:  BP 108/70   Pulse 80   Temp (!) 97.3 F (36.3 C)   Ht '5\' 7"'$  (1.702 m)   Wt 158 lb 9.6 oz (71.9 kg)   SpO2 95%   BMI 24.84 kg/m  Gen: NAD, resting comfortably CV: RRR no  murmurs rubs or gallops Lungs: CTAB no crackles, wheeze, rhonchi Abdomen: soft/nontender/nondistended.  No rebound or guarding.  Ext: no edema. Raised shoe on right leg per baseline Skin: warm, dry     Assessment and Plan   # Diabetes S: compliant with on Metformin 1000 mg BID and Glipizide 5 mg twice daily and jardiance 10 mg   -gabapentin started for cervical radiculopathy- he prefers to continue as seems to help with leg pain (possible neuropathy) CBGs- 130s to 150s in last week in morning.  Exercise and diet- trying to do salad at night and seems to help sugar levels be lower  A/P: hopefully stable- update a1c today. Continue current meds for now  #hypertension S: compliant with Amlodipine 10 mg once daily and lisinopril 20 mg daily.  BP Readings from Last 3 Encounters:  11/08/22 108/70  08/23/22 120/72  06/29/22 108/60  A/P: blood pressure looks really good once again I think we can reduce amlodipine to 5 mg (he can cut the 10 mg in half that he has and I sent a new prescription). Continue lisinopril 20 mg.   #hyperlipidemia S: compliant with Atorvastatin 10 mg 3 times weekly.  Lab Results  Component Value Date   CHOL 99 04/11/2022   HDL 33.90 (L) 04/11/2022   LDLCALC 54 04/11/2022   LDLDIRECT 68.0 10/14/2021   TRIG 56.0 04/11/2022   CHOLHDL 3 04/11/2022   A/P:  stable- continue current medicines. Not due for repeat yet   % BPH status post robotic prostatectomy December 2018- Follows with Dr. Alyson Ingles at [facility] urology.   PSA trend continues downward Lab Results  Component Value Date   PSA 1.34 04/11/2022   PSA 1.51 01/26/2021   PSA 2.09 11/25/2019   # Gout S: compliant with Allopurinol 100 mg daily and Colchicine 0.6 mg prn.   A/P: no recent flares- continue current medications    # Thrombocytopenia/leukopenia S:platelets generally around 100. he states has been like this for over 2 years.  Intermittent leukopenia- typically get cbc with  differential  07/04/18 path smear review "Myeloid population consists predominantly of mature  segmented neutrophils. No immature cells are identified. RBCs appear to be microcytic and hypochromic on smear review. Suggest evaluation for iron deficiency, if clinically indicated. Thrombocytopenia with some large platelets seen. No platelet clumps identified. Reviewed by Francis Gaines Mammarappallil, MD "  Iron levels normal 07/10/2019 A/P: we are continuing to monitor- if other cell lines worsen (last time only platelets and intermittently only slightly low WBC) then consider hematology consult   #fat necrosis left chest- ultrasound  08/26/22 with 6 week repeat improving- has one more 8 week repeat pending planned 12/13/22. Tenderness has improved.   Recommended follow up: No follow-ups on file. Future Appointments  Date Time Provider [place]  11/17/2022  2:00 PM Marin Olp, MD [facility]  12/13/2022  7:30 AM [facility] [place] 1 [facility]  04/20/2023 11:00 AM [facility] HEALTH COACH [facility]   Lab/Order associations:   ICD-10-CM   1. Controlled type 2 diabetes mellitus without complication, without long-term current use of insulin (HCC)  E11.9 Hemoglobin A1c    Microalbumin / creatinine urine ratio    CBC with Differential/Platelet    Comprehensive metabolic panel    2. Thrombocytopenia ([place])  D69.6     3. Hyperlipidemia associated with type 2 diabetes mellitus ([place])  E11.69    E78.5     4. Essential hypertension  I10       Meds ordered this encounter  Medications   amLODipine (NORVASC) 5 MG tablet    Sig: Take 2 tablets (10 mg total) by mouth daily.    Dispense:  90 tablet    Refill:  3   allopurinol (ZYLOPRIM) 100 MG tablet    Sig: Take 1 tablet (100 mg total) by mouth daily.    Dispense:  90 tablet    Refill:  3   atorvastatin (LIPITOR) 10 MG tablet    Sig: Take 1 tablet (10 mg total) by mouth 3 (three) times a week.    Dispense:  40 tablet    Refill:  3    gabapentin (NEURONTIN) 300 MG capsule    Sig: TAKE 1 CAPSULE BY MOUTH IN THE MORNING AND AT BEDTIME    Dispense:  120 capsule    Refill:  3   glipiZIDE (GLUCOTROL) 5 MG tablet    Sig: TAKE 1 TABLET BY MOUTH TWICE DAILY BEFORE MEAL(S)    Dispense:  180 tablet    Refill:  3   lisinopril (ZESTRIL) 20 MG tablet    Sig: Take 1 tablet (20 mg total) by mouth daily.    Dispense:  90 tablet    Refill:  3   empagliflozin (JARDIANCE) 10 MG TABS tablet    Sig: Take 1 tablet (10 mg total) by mouth daily before breakfast.    Dispense:  90 tablet    Refill:  3   metFORMIN (GLUCOPHAGE) 1000 MG tablet    Sig: Take 1 tablet (1,000 mg total) by mouth 2 (two) times daily with a meal.    Dispense:  180 tablet    Refill:  3    Return precautions advised.  Garret Reddish, MD

## 2022-11-08 NOTE — Patient Instructions (Addendum)
blood pressure looks really good once again I think we can reduce amlodipine to 5 mg (he can cut the 10 mg in half that he has and I sent a new prescription). Continue lisinopril 20 mg.   Labs in room If you have mychart- we will send your results within 3 business days of [place] receiving them.  If you do not have mychart- we will call you about results within 5 business days of [place] receiving them.  *please also note that you will see labs on mychart as soon as they post. I will later go in and write notes on them- will say "notes from Dr. Yong Channel"   Recommended follow up: Return in about 5 months (around 04/09/2023) for physical or sooner if needed.Schedule b4 you leave. -cancel physical on January 19th before you leave

## 2022-11-16 DIAGNOSIS — H401131 Primary open-angle glaucoma, bilateral, mild stage: Secondary | ICD-10-CM | POA: Diagnosis not present

## 2022-11-17 ENCOUNTER — Ambulatory Visit (INDEPENDENT_AMBULATORY_CARE_PROVIDER_SITE_OTHER): Payer: Medicare Other | Admitting: Family Medicine

## 2022-11-17 ENCOUNTER — Encounter: Payer: Self-pay | Admitting: Family Medicine

## 2022-11-17 VITALS — BP 118/72 | HR 68 | Temp 97.7°F | Ht 67.0 in | Wt 160.6 lb

## 2022-11-17 DIAGNOSIS — D72819 Decreased white blood cell count, unspecified: Secondary | ICD-10-CM | POA: Diagnosis not present

## 2022-11-17 DIAGNOSIS — D696 Thrombocytopenia, unspecified: Secondary | ICD-10-CM

## 2022-11-17 DIAGNOSIS — Z0000 Encounter for general adult medical examination without abnormal findings: Secondary | ICD-10-CM | POA: Diagnosis not present

## 2022-11-17 MED ORDER — AMLODIPINE BESYLATE 5 MG PO TABS: 5.0000 mg | ORAL_TABLET | Freq: Every day | ORAL | 3 refills | Status: DC

## 2022-11-17 NOTE — Progress Notes (Signed)
Phone: [phone]   Subjective:  Patient presents today for their annual physical. Chief complaint-noted.   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   Diabetes mellitus without complication ([place])    Difficult intubation    "mouth was not wide enough"   Dry eyes    History of adenomatous polyp of colon 11/06/2016   10/2016 adenoma x3 - recall colon 10/2019   Hyperlipidemia    Hypertension    Neuromuscular disorder ([place])    past hx tingling left arm    Prostate enlargement 2010   See's Urologist    Patient Active Problem List   Diagnosis Date Noted   Diabetes mellitus type II, controlled ([place]) 04/09/2013    Priority: High   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 03/31/2019    Priority: Medium    Hyperlipidemia associated with type 2 diabetes mellitus ([place]) 07/09/2018    Priority: Medium    Left cervical radiculopathy 09/24/2017    Priority: Medium    Hx of colonic polyps 11/06/2016    Priority: Medium    Chronic ankle pain 06/14/2016    Priority: Medium    Thrombocytopenia ([place]) 11/29/2015    Priority: Medium    Gout 04/30/2015    Priority: Medium    Essential hypertension 04/09/2013    Priority: Medium    BPH (benign prostatic hyperplasia) 04/09/2013    Priority: Medium    Leg length difference, acquired 06/20/2019    Priority: Low   Arthritis of first metatarsophalangeal (MTP) joint of right foot 05/30/2019    Priority: Low   Sesamoiditis 11/08/2018    Priority: Low   Osteoarthritis of spine with radiculopathy, cervical region 11/14/2017    Priority: Low   Right knee pain 11/21/2016    Priority: Low   Erectile dysfunction 02/04/2015    Priority: Low   Dupuytren's contracture of right hand 06/13/2021    Past Surgical History:  Procedure Laterality Date   COLONOSCOPY     COLONOSCOPY WITH PROPOFOL N/A 11/02/2016   Procedure: COLONOSCOPY WITH PROPOFOL;  Surgeon: Gatha Mayer, MD;  Location: [facility];  Service: Endoscopy;  Laterality: N/A;   COLONOSCOPY WITH PROPOFOL N/A 05/18/2020   Procedure: COLONOSCOPY WITH PROPOFOL;  Surgeon: Gatha Mayer, MD;  Location: [facility];  Service: Endoscopy;  Laterality: N/A;   LEG SURGERY Right 1997   accident related   XI ROBOTIC ASSISTED SIMPLE PROSTATECTOMY N/A 10/05/2017   Procedure: XI ROBOTIC ASSISTED SIMPLE PROSTATECTOMY WITH UMBILICAL HERNIA REPAIR;  Surgeon: Cleon Gustin, MD;  Location: [facility] ORS;  Service: Urology;  Laterality: N/A;    Family History  Problem Relation Age of Onset   Other Father        and mother- states died of old age. States no medical problems in parents or siblings   Colon cancer Neg Hx    Colon polyps Neg Hx    Esophageal cancer Neg Hx    Rectal cancer Neg Hx    Stomach cancer Neg Hx     Medications- reviewed and updated Current Outpatient Medications  Medication Sig Dispense Refill   ACCU-CHEK GUIDE test strip USE 1  4 TIMES DAILY AS DIRECTED 100 each 0   Accu-Chek Softclix Lancets lancets 4 (four) times daily.     acetaminophen (  TYLENOL) 500 MG tablet Take 1,000 mg by mouth every 6 (six) hours as needed for moderate pain.      allopurinol (ZYLOPRIM) 100 MG tablet Take 1 tablet (100 mg total) by mouth daily. 90 tablet 3   atorvastatin (LIPITOR) 10 MG tablet Take 1 tablet (10 mg total) by mouth 3 (three) times a week. 40 tablet 3   colchicine (COLCRYS) 0.6 MG tablet TAKE 2 CAPSULES BY MOUTH AT THE FIRST SIGN OF GOUT THEN TAKE 1 CAPSULE 2 HOURS LATER THEN 1 CAPSULE DAILY UNTIL FLARE IS RESOLVED 60 tablet 2   diclofenac Sodium (VOLTAREN) 1 % GEL APPLY 2 GRAMS TOPICALLY TO THE AFFECTED AREA 4 TIMES DAILY 100 g 3   empagliflozin (JARDIANCE) 10 MG TABS tablet Take 1 tablet (10 mg total) by mouth daily  before breakfast. 90 tablet 3   feeding supplement, ENSURE ENLIVE, (ENSURE ENLIVE) LIQD Take 1 Bottle by mouth daily.     gabapentin (NEURONTIN) 300 MG capsule TAKE 1 CAPSULE BY MOUTH IN THE MORNING AND AT BEDTIME 120 capsule 3   glipiZIDE (GLUCOTROL) 5 MG tablet TAKE 1 TABLET BY MOUTH TWICE DAILY BEFORE MEAL(S) 180 tablet 3   latanoprost (XALATAN) 0.005 % ophthalmic solution Place 1 drop into both eyes at bedtime.      lisinopril (ZESTRIL) 20 MG tablet Take 1 tablet (20 mg total) by mouth daily. 90 tablet 3   metFORMIN (GLUCOPHAGE) 1000 MG tablet Take 1 tablet (1,000 mg total) by mouth 2 (two) times daily with a meal. 180 tablet 3   Multiple Vitamin (MULTIVITAMIN ADULT PO) Take by mouth.     Polyethyl Glycol-Propyl Glycol (SYSTANE) 0.4-0.3 % SOLN Apply to eye. For dry eyes 3 x a day     Tetrahyd-Glyc-Hypro-PEG-ZnSulf (VISINE TOTALITY MULTI-SYMPTOM OP) Apply 1 drop to eye in the morning, at noon, and at bedtime.     traMADol (ULTRAM) 50 MG tablet Take 1 tablet (50 mg total) by mouth every 6 (six) hours as needed for moderate pain or severe pain (chronic foot pain. do not drive for 8 hours after taking.). 20 tablet 0   amLODipine (NORVASC) 5 MG tablet Take 1 tablet (5 mg total) by mouth daily. 90 tablet 3   No current facility-administered medications for this visit.    Allergies-reviewed and updated No Known Allergies  Social History   Social History Narrative   Married. 4 children (1 boy, 3 girls). 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       Hobbies: enjoys reading novels. Enjoys African authors   Goes to [place]   Objective  Objective:  BP 118/72   Pulse 68   Temp 97.7 F (36.5 C)   Ht '5\' 7"'$  (1.702 m)   Wt 160 lb 9.6 oz (72.8 kg)   SpO2 98%    BMI 25.15 kg/m  Gen: NAD, resting comfortably HEENT: Mucous membranes are moist. Oropharynx normal, TM normal Neck: no thyromegaly CV: RRR no murmurs rubs or gallops Lungs: CTAB no crackles, wheeze, rhonchi Abdomen: soft/nontender/nondistended/normal bowel sounds. No rebound or guarding.  Ext: no edema Skin: warm, dry Neuro: grossly normal, moves all extremities, PERRLA    Assessment and Plan  72 y.o. male presenting for annual physical.  Health Maintenance counseling: 1.  Anticipatory guidance: Patient counseled regarding regular dental exams -q6 months, eye exams -yearly,  avoiding smoking and second hand smoke , limiting alcohol to 2 beverages per day - doesn't drink, no illicit drugs .   2. Risk factor reduction:  Advised patient of need for regular exercise and diet rich and fruits and vegetables to reduce risk of heart attack and stroke.  Exercise- not much lately.  Diet/weight management-stable form last physical- varies slightly- reasonable weight.  Wt Readings from Last 3 Encounters:  11/17/22 160 lb 9.6 oz (72.8 kg)  11/08/22 158 lb 9.6 oz (71.9 kg)  08/23/22 159 lb 9.6 oz (72.4 kg)  3. Immunizations/screenings/ancillary studies- could consider rsv vaccination at pharmacy Immunization History  Administered Date(s) Administered   Fluad Quad(high Dose 65+) 07/10/2019, 07/27/2020   Hepatitis B 02/27/2021, 03/30/2021   Influenza Split 07/31/2011   Influenza, High Dose Seasonal PF 09/01/2017   Influenza-Unspecified 07/16/2015, 08/14/2018, 09/14/2021   Moderna Sars-Covid-2 Vaccination 10/27/2019, 11/24/2019, 09/16/2020, 03/17/2021, 07/19/2021   Pfizer Covid-19 Vaccine Bivalent Booster 48yr & up 06/28/2022   Pneumococcal Conjugate-13 03/29/2017, 02/12/2018   Pneumococcal Polysaccharide-23 07/31/2011, 07/10/2019, 02/24/2020   Tdap 11/29/2015   Zoster Recombinat (Shingrix) 02/12/2018, 05/01/2018  4. Prostate cancer screening- prostatectomy for BPH- urology visit was well over a  year ago- PSA looked good last year- he wants to hold off on urology visit- consider psa next bloodwork  Lab Results  Component Value Date   PSA 1.34 04/11/2022   PSA 1.51 01/26/2021   PSA 2.09 11/25/2019   5. Colon cancer screening - 05/18/20 with 5 year repat 6. Skin cancer screening- lower risk due to melanin content. advised regular sunscreen use. Denies worrisome, changing, or new skin lesions.  7. Smoking associated screening (lung cancer screening, AAA screen 65-75, UA)- never smoker 8. STD screening - only active with wife  Status of chronic or acute concerns   # Diabetes S: compliant with on Metformin 1000 mg BID and Glipizide 5 mg twice daily and jardiance 10 mg   -gabapentin started for cervical radiculopathy- he prefers to continue as seems to help with leg pain (possible neuropathy)- he reports still helping Lab Results  Component Value Date   HGBA1C 6.4 11/08/2022   HGBA1C 6.2 04/11/2022   HGBA1C 6.5 10/14/2021   A/P: stable- continue current medicines  -gabapentin still helps for neuropathy- continue current medications   #hypertension S: compliant with Amlodipine 5 mg once daily (reduced to this at last visit) and lisinopril 20 mg daily.  BP Readings from Last 3 Encounters:  11/17/22 118/72  11/08/22 108/70  08/23/22 120/72  A/P: stable- continue current medicines   #hyperlipidemia S: compliant with Atorvastatin 10 mg 3 times weekly.  Attempt increase to twice weekly September 2021  Lab Results  Component Value Date   CHOL 99 04/11/2022   HDL 33.90 (L) 04/11/2022   LDLCALC 54 04/11/2022   LDLDIRECT 68.0 10/14/2021   TRIG 56.0 04/11/2022   CHOLHDL 3 04/11/2022   A/P: at goal with LDL under 70 on 3x a week atorvastatin  % BPH status post robotic prostatectomy December 2018- [facility]   For elevated PSA-biopsy in 2017 reassuring. - he wants to hold off on repeat urology visit- check PSA June or later likely  # Gout S:  compliant with Allopurinol 100 mg daily and Colchicine 0.6 mg prn.   Lab Results  Component Value Date   LABURIC 5.4 04/11/2022  A/P: stable-  with no flares- continue to monitor   #  Thrombocytopenia/leukopenia S:platelets generally around 100. he states has been like this for over 2 years.  Intermittent leukopenia- typically get cbc with differential  07/04/18 path smear review "Myeloid population consists predominantly of mature  segmented neutrophils. No immature cells are identified. RBCs appear to be microcytic and hypochromic on smear review. Suggest evaluation for iron deficiency, if clinically indicated. Thrombocytopenia with some large platelets seen. No platelet clumps identified. Reviewed by Francis Gaines Mammarappallil, MD "  Iron levels normal 07/10/2019 Lab Results  Component Value Date   WBC 3.7 (L) 11/08/2022   HGB 14.5 11/08/2022   HCT 45.4 11/08/2022   MCV 82.2 11/08/2022   PLT 97.0 (L) 11/08/2022  A/P:  long term platelet issues but intermittent WBC issues recently- we opted for hematology referral  Recommended follow up: Return in about 6 months (around 05/18/2023) for followup or sooner if needed.Schedule b4 you leave. Future Appointments  Date Time Provider [place]  12/13/2022  7:30 AM [facility] [place] 1 [facility]  04/20/2023 11:00 AM [facility] HEALTH COACH [facility]   Lab/Order associations: already had labs   ICD-10-CM   1. Preventative health care  Z00.00     2. Leukopenia, unspecified type  D72.819 Ambulatory referral to Hematology / Oncology    3. Thrombocytopenia ([place])  D69.6 Ambulatory referral to Hematology / Oncology      Meds ordered this encounter  Medications   amLODipine (NORVASC) 5 MG tablet    Sig: Take 1 tablet (5 mg total) by mouth daily.    Dispense:  90 tablet    Refill:  3    Return precautions advised.  Garret Reddish, MD

## 2022-11-17 NOTE — Patient Instructions (Addendum)
Glad you are doing well  Recommended follow up: Return in about 6 months (around 05/18/2023) for followup or sooner if needed.Schedule b4 you leave.

## 2022-12-08 ENCOUNTER — Encounter: Payer: Self-pay | Admitting: Oncology

## 2022-12-08 ENCOUNTER — Other Ambulatory Visit: Payer: Self-pay

## 2022-12-08 ENCOUNTER — Inpatient Hospital Stay: Payer: 59

## 2022-12-08 ENCOUNTER — Inpatient Hospital Stay: Payer: 59 | Attending: Oncology | Admitting: Oncology

## 2022-12-08 VITALS — BP 138/85 | HR 67 | Temp 97.7°F | Resp 18 | Wt 154.0 lb

## 2022-12-08 DIAGNOSIS — E785 Hyperlipidemia, unspecified: Secondary | ICD-10-CM | POA: Diagnosis not present

## 2022-12-08 DIAGNOSIS — D696 Thrombocytopenia, unspecified: Secondary | ICD-10-CM | POA: Diagnosis not present

## 2022-12-08 DIAGNOSIS — M109 Gout, unspecified: Secondary | ICD-10-CM | POA: Insufficient documentation

## 2022-12-08 DIAGNOSIS — D72819 Decreased white blood cell count, unspecified: Secondary | ICD-10-CM | POA: Diagnosis not present

## 2022-12-08 DIAGNOSIS — Z86010 Personal history of colonic polyps: Secondary | ICD-10-CM | POA: Insufficient documentation

## 2022-12-08 DIAGNOSIS — M1A09X0 Idiopathic chronic gout, multiple sites, without tophus (tophi): Secondary | ICD-10-CM

## 2022-12-08 DIAGNOSIS — N400 Enlarged prostate without lower urinary tract symptoms: Secondary | ICD-10-CM | POA: Insufficient documentation

## 2022-12-08 DIAGNOSIS — T504X5A Adverse effect of drugs affecting uric acid metabolism, initial encounter: Secondary | ICD-10-CM

## 2022-12-08 DIAGNOSIS — Z79899 Other long term (current) drug therapy: Secondary | ICD-10-CM | POA: Insufficient documentation

## 2022-12-08 DIAGNOSIS — I10 Essential (primary) hypertension: Secondary | ICD-10-CM | POA: Diagnosis not present

## 2022-12-08 DIAGNOSIS — D709 Neutropenia, unspecified: Secondary | ICD-10-CM | POA: Insufficient documentation

## 2022-12-08 DIAGNOSIS — Z7984 Long term (current) use of oral hypoglycemic drugs: Secondary | ICD-10-CM | POA: Diagnosis not present

## 2022-12-08 DIAGNOSIS — H409 Unspecified glaucoma: Secondary | ICD-10-CM | POA: Insufficient documentation

## 2022-12-08 DIAGNOSIS — E119 Type 2 diabetes mellitus without complications: Secondary | ICD-10-CM | POA: Insufficient documentation

## 2022-12-08 LAB — CBC WITH DIFFERENTIAL (CANCER CENTER ONLY)
Abs Immature Granulocytes: 0.01 10*3/uL (ref 0.00–0.07)
Basophils Absolute: 0 10*3/uL (ref 0.0–0.1)
Basophils Relative: 1 %
Eosinophils Absolute: 0.2 10*3/uL (ref 0.0–0.5)
Eosinophils Relative: 5 %
HCT: 44.5 % (ref 39.0–52.0)
Hemoglobin: 14.7 g/dL (ref 13.0–17.0)
Immature Granulocytes: 0 %
Lymphocytes Relative: 45 %
Lymphs Abs: 1.6 10*3/uL (ref 0.7–4.0)
MCH: 26.7 pg (ref 26.0–34.0)
MCHC: 33 g/dL (ref 30.0–36.0)
MCV: 80.8 fL (ref 80.0–100.0)
Monocytes Absolute: 0.3 10*3/uL (ref 0.1–1.0)
Monocytes Relative: 9 %
Neutro Abs: 1.4 10*3/uL — ABNORMAL LOW (ref 1.7–7.7)
Neutrophils Relative %: 40 %
Platelet Count: 93 10*3/uL — ABNORMAL LOW (ref 150–400)
RBC: 5.51 MIL/uL (ref 4.22–5.81)
RDW: 13.1 % (ref 11.5–15.5)
WBC Count: 3.6 10*3/uL — ABNORMAL LOW (ref 4.0–10.5)
nRBC: 0 % (ref 0.0–0.2)

## 2022-12-08 LAB — APTT: aPTT: 28 seconds (ref 24–36)

## 2022-12-08 LAB — TECHNOLOGIST SMEAR REVIEW: Plt Morphology: NORMAL

## 2022-12-08 LAB — VITAMIN B12: Vitamin B-12: 694 pg/mL (ref 180–914)

## 2022-12-08 LAB — DIRECT ANTIGLOBULIN TEST (NOT AT ARMC)
DAT, IgG: NEGATIVE
DAT, complement: NEGATIVE

## 2022-12-08 LAB — HEPATITIS PANEL, ACUTE
HCV Ab: NONREACTIVE
Hep A IgM: NONREACTIVE
Hep B C IgM: NONREACTIVE
Hepatitis B Surface Ag: NONREACTIVE

## 2022-12-08 LAB — HIV ANTIBODY (ROUTINE TESTING W REFLEX): HIV Screen 4th Generation wRfx: NONREACTIVE

## 2022-12-08 LAB — FOLATE: Folate: 16.6 ng/mL (ref >5.9)

## 2022-12-08 LAB — PROTIME-INR
INR: 1 (ref 0.8–1.2)
Prothrombin Time: 13 seconds (ref 11.4–15.2)

## 2022-12-08 LAB — FIBRINOGEN: Fibrinogen: 242 mg/dL (ref 210–475)
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
## 2022-12-09 LAB — ANA COMPREHENSIVE PANEL
Anti JO-1: <0.2 AI (ref 0.0–0.9)
Centromere Ab Screen: <0.2 AI (ref 0.0–0.9)
Chromatin Ab SerPl-aCnc: <0.2 AI (ref 0.0–0.9)
ENA SM Ab Ser-aCnc: <0.2 AI (ref 0.0–0.9)
Ribonucleic Protein: 1.2 AI — ABNORMAL HIGH (ref 0.0–0.9)
SSA (Ro) (ENA) Antibody, IgG: <0.2 AI (ref 0.0–0.9)
SSB (La) (ENA) Antibody, IgG: <0.2 AI (ref 0.0–0.9)
Scleroderma (Scl-70) (ENA) Antibody, IgG: <0.2 AI (ref 0.0–0.9)
ds DNA Ab: <1 IU/mL (ref 0–9)

## 2022-12-10 LAB — RHEUMATOID FACTOR: Rheumatoid fact SerPl-aCnc: <10 IU/mL (ref <14.0)

## 2022-12-10 LAB — HAPTOGLOBIN: Haptoglobin: 109 mg/dL (ref 34–355)

## 2022-12-11 LAB — KAPPA/LAMBDA LIGHT CHAINS
Kappa free light chain: 27.7 mg/L — ABNORMAL HIGH (ref 3.3–19.4)
Kappa, lambda light chain ratio: 2.11 — ABNORMAL HIGH (ref 0.26–1.65)
Lambda free light chains: 13.1 mg/L (ref 5.7–26.3)

## 2022-12-13 ENCOUNTER — Other Ambulatory Visit: Payer: 59

## 2022-12-14 LAB — MULTIPLE MYELOMA PANEL, SERUM
Albumin SerPl Elph-Mcnc: 4.4 g/dL (ref 2.9–4.4)
Albumin/Glob SerPl: 1.7 (ref 0.7–1.7)
Alpha 1: 0.2 g/dL (ref 0.0–0.4)
Alpha2 Glob SerPl Elph-Mcnc: 0.5 g/dL (ref 0.4–1.0)
B-Globulin SerPl Elph-Mcnc: 1 g/dL (ref 0.7–1.3)
Gamma Glob SerPl Elph-Mcnc: 1 g/dL (ref 0.4–1.8)
Globulin, Total: 2.6 g/dL (ref 2.2–3.9)
IgA: 169 mg/dL (ref 61–437)
IgG (Immunoglobin G), Serum: 1120 mg/dL (ref 603–1613)
IgM (Immunoglobulin M), Srm: 67 mg/dL (ref 15–143)
Total Protein ELP: 7 g/dL (ref 6.0–8.5)

## 2022-12-15 ENCOUNTER — Ambulatory Visit (HOSPITAL_COMMUNITY): Admission: RE | Admit: 2022-12-15 | Payer: 59 | Source: Ambulatory Visit

## 2022-12-15 ENCOUNTER — Telehealth: Payer: Self-pay | Admitting: Oncology

## 2022-12-15 NOTE — Telephone Encounter (Signed)
Called patient regarding March appointments, patient is notified.

## 2022-12-16 LAB — ZINC: Zinc: 88 ug/dL (ref 44–115)

## 2022-12-16 LAB — COPPER, SERUM: Copper: 93 ug/dL (ref 69–132)

## 2022-12-18 DIAGNOSIS — T504X5A Adverse effect of drugs affecting uric acid metabolism, initial encounter: Secondary | ICD-10-CM | POA: Insufficient documentation

## 2022-12-27 ENCOUNTER — Ambulatory Visit
Admission: RE | Admit: 2022-12-27 | Discharge: 2022-12-27 | Disposition: A | Discharge status: Home / Self Care | Payer: 59 | Source: Ambulatory Visit | Attending: Internal Medicine | Admitting: Internal Medicine

## 2022-12-27 DIAGNOSIS — N6320 Unspecified lump in the left breast, unspecified quadrant: Secondary | ICD-10-CM

## 2022-12-27 DIAGNOSIS — N641 Fat necrosis of breast: Secondary | ICD-10-CM | POA: Diagnosis not present

## 2022-12-29 DIAGNOSIS — H401131 Primary open-angle glaucoma, bilateral, mild stage: Secondary | ICD-10-CM | POA: Diagnosis not present

## 2023-01-04 ENCOUNTER — Other Ambulatory Visit: Payer: Self-pay | Admitting: Family Medicine

## 2023-01-05 ENCOUNTER — Inpatient Hospital Stay: Payer: 59 | Attending: Oncology

## 2023-01-05 ENCOUNTER — Other Ambulatory Visit: Payer: Self-pay

## 2023-01-05 ENCOUNTER — Inpatient Hospital Stay (HOSPITAL_BASED_OUTPATIENT_CLINIC_OR_DEPARTMENT_OTHER): Payer: 59 | Admitting: Oncology

## 2023-01-05 VITALS — BP 138/81 | HR 98 | Temp 97.6°F | Resp 16 | Wt 157.5 lb

## 2023-01-05 DIAGNOSIS — D72819 Decreased white blood cell count, unspecified: Secondary | ICD-10-CM | POA: Diagnosis not present

## 2023-01-05 DIAGNOSIS — Z79899 Other long term (current) drug therapy: Secondary | ICD-10-CM | POA: Insufficient documentation

## 2023-01-05 DIAGNOSIS — Z8613 Personal history of malaria: Secondary | ICD-10-CM

## 2023-01-05 DIAGNOSIS — Z791 Long term (current) use of non-steroidal anti-inflammatories (NSAID): Secondary | ICD-10-CM | POA: Diagnosis not present

## 2023-01-05 DIAGNOSIS — H409 Unspecified glaucoma: Secondary | ICD-10-CM | POA: Diagnosis not present

## 2023-01-05 DIAGNOSIS — E119 Type 2 diabetes mellitus without complications: Secondary | ICD-10-CM | POA: Diagnosis not present

## 2023-01-05 DIAGNOSIS — T504X5A Adverse effect of drugs affecting uric acid metabolism, initial encounter: Secondary | ICD-10-CM | POA: Diagnosis not present

## 2023-01-05 DIAGNOSIS — D696 Thrombocytopenia, unspecified: Secondary | ICD-10-CM | POA: Diagnosis not present

## 2023-01-05 DIAGNOSIS — I10 Essential (primary) hypertension: Secondary | ICD-10-CM | POA: Diagnosis not present

## 2023-01-05 DIAGNOSIS — M109 Gout, unspecified: Secondary | ICD-10-CM | POA: Diagnosis not present

## 2023-01-05 DIAGNOSIS — N400 Enlarged prostate without lower urinary tract symptoms: Secondary | ICD-10-CM | POA: Diagnosis not present

## 2023-01-05 DIAGNOSIS — Z9079 Acquired absence of other genital organ(s): Secondary | ICD-10-CM | POA: Insufficient documentation

## 2023-01-05 DIAGNOSIS — D709 Neutropenia, unspecified: Secondary | ICD-10-CM

## 2023-01-05 DIAGNOSIS — E785 Hyperlipidemia, unspecified: Secondary | ICD-10-CM | POA: Insufficient documentation

## 2023-01-05 DIAGNOSIS — Z7984 Long term (current) use of oral hypoglycemic drugs: Secondary | ICD-10-CM | POA: Diagnosis not present

## 2023-01-05 DIAGNOSIS — Z86010 Personal history of colonic polyps: Secondary | ICD-10-CM | POA: Diagnosis not present

## 2023-01-05 NOTE — Progress Notes (Signed)
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 in this encounter.  35  minutes was spent in patient care.  This included time spent preparing to see the patient (e.g., review of tests), obtaining and/or reviewing separately obtained history, counseling and educating the patient, ordering tests; documenting  clinical information in the electronic or other health record, independently interpreting results and communicating results to the patient as well as coordination of care.      All questions were answered. The patient knows to call the clinic with any problems, questions or concerns.  This note was electronically signed.    Barbee Cough, MD  01/05/2023 9:34 AM

## 2023-01-09 ENCOUNTER — Other Ambulatory Visit: Payer: Self-pay

## 2023-01-09 MED ORDER — ACCU-CHEK GUIDE VI STRP: 1.0000 | ORAL_STRIP | Freq: Four times a day (QID) | 0 refills | Status: DC

## 2023-01-13 ENCOUNTER — Emergency Department (HOSPITAL_COMMUNITY)
Admission: EM | Admit: 2023-01-13 | Discharge: 2023-01-13 | Disposition: A | Discharge status: Home / Self Care | Payer: 59 | Attending: Emergency Medicine | Admitting: Emergency Medicine

## 2023-01-13 ENCOUNTER — Other Ambulatory Visit: Payer: Self-pay

## 2023-01-13 ENCOUNTER — Encounter (HOSPITAL_COMMUNITY): Payer: Self-pay | Admitting: Emergency Medicine

## 2023-01-13 DIAGNOSIS — M542 Cervicalgia: Secondary | ICD-10-CM | POA: Diagnosis not present

## 2023-01-13 DIAGNOSIS — I10 Essential (primary) hypertension: Secondary | ICD-10-CM | POA: Diagnosis not present

## 2023-01-13 DIAGNOSIS — Z79899 Other long term (current) drug therapy: Secondary | ICD-10-CM | POA: Diagnosis not present

## 2023-01-13 DIAGNOSIS — X58XXXA Exposure to other specified factors, initial encounter: Secondary | ICD-10-CM | POA: Insufficient documentation

## 2023-01-13 DIAGNOSIS — E119 Type 2 diabetes mellitus without complications: Secondary | ICD-10-CM | POA: Insufficient documentation

## 2023-01-13 DIAGNOSIS — Z743 Need for continuous supervision: Secondary | ICD-10-CM | POA: Diagnosis not present

## 2023-01-13 DIAGNOSIS — S161XXA Strain of muscle, fascia and tendon at neck level, initial encounter: Secondary | ICD-10-CM | POA: Insufficient documentation

## 2023-01-13 DIAGNOSIS — Z7984 Long term (current) use of oral hypoglycemic drugs: Secondary | ICD-10-CM | POA: Diagnosis not present

## 2023-01-13 DIAGNOSIS — R739 Hyperglycemia, unspecified: Secondary | ICD-10-CM | POA: Diagnosis not present

## 2023-01-13 MED ORDER — CYCLOBENZAPRINE HCL 10 MG PO TABS: 10.0000 mg | ORAL_TABLET | Freq: Two times a day (BID) | ORAL | 0 refills | Status: DC | PRN

## 2023-01-13 MED ORDER — CYCLOBENZAPRINE HCL 10 MG PO TABS
5.0000 mg | ORAL_TABLET | Freq: Once | ORAL | Status: AC
  Administered 2023-01-13: 5 mg via ORAL
  Filled 2023-01-13: qty 1

## 2023-01-13 MED ORDER — KETOROLAC TROMETHAMINE 60 MG/2ML IM SOLN
30.0000 mg | Freq: Once | INTRAMUSCULAR | Status: AC
  Administered 2023-01-13: 30 mg via INTRAMUSCULAR
  Filled 2023-01-13: qty 2

## 2023-01-13 MED ORDER — LIDOCAINE 5 % EX PTCH: 1.0000 | MEDICATED_PATCH | CUTANEOUS | 0 refills | Status: DC

## 2023-01-13 MED ORDER — MELOXICAM 15 MG PO TABS: 15.0000 mg | ORAL_TABLET | Freq: Every day | ORAL | 0 refills | Status: AC

## 2023-01-13 MED ORDER — LIDOCAINE 5 % EX PTCH
1.0000 | MEDICATED_PATCH | CUTANEOUS | Status: DC
  Administered 2023-01-13: 1 via TRANSDERMAL
  Filled 2023-01-13: qty 1

## 2023-01-13 NOTE — ED Triage Notes (Addendum)
Pt c/o sudden onset of non radiating midline neck pain with movement. Denies recent injuries. Denies weakness or numbness. No hx of cervical spinal surgeries.

## 2023-01-13 NOTE — ED Provider Notes (Signed)
[facility] Provider Note   CSN: [id number] Arrival date & time: 01/13/23  1507     History  Chief Complaint  Patient presents with   Neck Pain    Preston Pennington is a 72 y.o. male.   Neck Pain    72 year old male with medical history significant for cervical radiculopathy, HTN, DM 2, HLD who presents to the emergency department with left-sided neck pain.  The patient states that the pain began this afternoon.  Denies any recent falls or trauma.  He states it is 9 out of 10 on the left side of the musculature posterior to his neck.  He denies any vision changes, vision loss, numbness, weakness.  He denies any radiation of the pain, no radiation down his arm.  Pain is worse with movement and twisting of his neck to the left.  No fevers or chills.    Home Medications Prior to Admission medications   Medication Sig Start Date End Date Taking? Authorizing Provider  cyclobenzaprine (FLEXERIL) 10 MG tablet Take 1 tablet (10 mg total) by mouth 2 (two) times daily as needed for muscle spasms. 01/13/23  Yes Regan Lemming, MD  lidocaine (LIDODERM) 5 % Place 1 patch onto the skin daily. Remove & Discard patch within 12 hours or as directed by MD 01/13/23  Yes Regan Lemming, MD  meloxicam (MOBIC) 15 MG tablet Take 1 tablet (15 mg total) by mouth daily for 7 days. 01/13/23 01/20/23 Yes Regan Lemming, MD  Accu-Chek Softclix Lancets lancets 4 (four) times daily. 02/01/20   [provider]  acetaminophen (TYLENOL) 500 MG tablet Take 1,000 mg by mouth every 6 (six) hours as needed for moderate pain.     [provider]  allopurinol (ZYLOPRIM) 100 MG tablet Take 1 tablet (100 mg total) by mouth daily. 11/08/22   Marin Olp, MD  amLODipine (NORVASC) 5 MG tablet Take 1 tablet (5 mg total) by mouth daily. 11/17/22   Marin Olp, MD  atorvastatin (LIPITOR) 10 MG tablet Take 1 tablet (10 mg total) by mouth 3 (three) times a week. 11/08/22    Marin Olp, MD  colchicine (COLCRYS) 0.6 MG tablet TAKE 2 CAPSULES BY MOUTH AT THE FIRST SIGN OF GOUT THEN TAKE 1 CAPSULE 2 HOURS LATER THEN 1 CAPSULE DAILY UNTIL FLARE IS RESOLVED 07/25/22   Marin Olp, MD  empagliflozin (JARDIANCE) 10 MG TABS tablet Take 1 tablet (10 mg total) by mouth daily before breakfast. 11/08/22   Marin Olp, MD  feeding supplement, ENSURE ENLIVE, (ENSURE ENLIVE) LIQD Take 1 Bottle by mouth daily.    [provider]  gabapentin (NEURONTIN) 300 MG capsule TAKE 1 CAPSULE BY MOUTH IN THE MORNING AND AT BEDTIME 11/08/22   Marin Olp, MD  glipiZIDE (GLUCOTROL) 5 MG tablet TAKE 1 TABLET BY MOUTH TWICE DAILY BEFORE MEAL(S) 11/08/22   Marin Olp, MD  glucose blood (ACCU-CHEK GUIDE) test strip 1 each by Other route 4 (four) times daily. Dx: E11.9 01/09/23   Marin Olp, MD  latanoprost (XALATAN) 0.005 % ophthalmic solution Place 1 drop into both eyes at bedtime.  04/15/20   [provider]  lisinopril (ZESTRIL) 20 MG tablet Take 1 tablet (20 mg total) by mouth daily. 11/08/22   Marin Olp, MD  metFORMIN (GLUCOPHAGE) 1000 MG tablet Take 1 tablet (1,000 mg total) by mouth 2 (two) times daily with a meal. 11/08/22   Marin Olp, MD  Multiple Vitamin (MULTIVITAMIN ADULT PO) Take by mouth.    [provider]  Polyethyl Glycol-Propyl Glycol (SYSTANE) 0.4-0.3 % SOLN Apply to eye. For dry eyes 3 x a day    [provider]  Tetrahyd-Glyc-Hypro-PEG-ZnSulf (VISINE TOTALITY MULTI-SYMPTOM OP) Apply 1 drop to eye in the morning, at noon, and at bedtime.    [provider]  traMADol (ULTRAM) 50 MG tablet Take 1 tablet (50 mg total) by mouth every 6 (six) hours as needed for moderate pain or severe pain (chronic foot pain. do not drive for 8 hours after taking.). 06/29/22   Marin Olp, MD      Allergies    Patient has no known allergies.    Review of Systems   Review of Systems  Musculoskeletal:   Positive for neck pain.  All other systems reviewed and are negative.   Physical Exam Updated Vital Signs BP 137/82 (BP Location: Right Arm)   Pulse 76   Temp 98 F (36.7 C) (Oral)   Resp 18   Ht 5\' 7"  (1.702 m)   Wt 76.2 kg   SpO2 95%   BMI 26.31 kg/m  Physical Exam Vitals and nursing note reviewed.  Constitutional:      General: He is not in acute distress. HENT:     Head: Normocephalic and atraumatic.  Eyes:     Conjunctiva/sclera: Conjunctivae normal.     Pupils: Pupils are equal, round, and reactive to light.  Neck:     Comments: There is tenderness to palpation in the posterior neck musculature, no midline tenderness to palpation, range of motion limited by pain.  Negative Spurling sign Cardiovascular:     Rate and Rhythm: Normal rate and regular rhythm.  Pulmonary:     Effort: Pulmonary effort is normal. No respiratory distress.  Abdominal:     General: There is no distension.     Tenderness: There is no guarding.  Musculoskeletal:        General: No deformity or signs of injury.     Cervical back: Neck supple. Tenderness present. No edema, signs of trauma, rigidity, torticollis or crepitus. No spinous process tenderness.  Skin:    Findings: No lesion or rash.  Neurological:     General: No focal deficit present.     Mental Status: He is alert. Mental status is at baseline.     Cranial Nerves: No cranial nerve deficit.     Comments: 5/5 strength in all 4 extremities with intact sensation to light touch     ED Results / Procedures / Treatments   Labs (all labs ordered are listed, but only abnormal results are displayed) Labs Reviewed - No data to display  EKG None  Radiology No results found.  Procedures Procedures    Medications Ordered in ED Medications  lidocaine (LIDODERM) 5 % 1 patch (1 patch Transdermal Patch Applied 01/13/23 1613)  ketorolac (TORADOL) injection 30 mg (30 mg Intramuscular Given 01/13/23 1613)  cyclobenzaprine (FLEXERIL)  tablet 5 mg (5 mg Oral Given 01/13/23 1613)    ED Course/ Medical Decision Making/ A&P                             Medical Decision Making Risk Prescription drug management.    72 year old male with medical history significant for cervical radiculopathy, HTN, DM 2, HLD who presents to the emergency department with left-sided neck pain.  The patient states that the pain began this afternoon.  Denies any recent falls or trauma.  He states it is 9 out of 10 on the left side of the musculature posterior to his neck.  He denies any vision changes, vision loss, numbness, weakness.  He denies any radiation of the pain, no radiation down his arm.  Pain is worse with movement and twisting of his neck to the left.  No fevers or chills.    On arrival, the patient was vitally stable, afebrile, not tachycardic or tachypneic, normotensive.  Physical exam significant for the following:  tenderness to palpation in the posterior neck musculature, no midline tenderness to palpation, range of motion limited by pain. Negative Spurling sign. 5/5 strength and intact sensation in all four extremities.  With no midline tenderness, clear tenderness about the patient's left neck musculature, normal neurologic exam with no deficits, low suspicion for acute fracture, epidural abscess, osteomyelitis, discitis, vertebral dissection.  Symptoms are most consistent with likely musculoskeletal strain.  Will treat with a course of NSAIDs, lidocaine patch, Flexeril for muscle spasm.  Patient was treated with Flexeril, Toradol, lidocaine patch in the emergency department with improvement.  On repeat assessment, the patient's symptoms had improved.  Advised rest, ice and heating, NSAIDs for pain control, lidocaine patch, Flexeril and follow-up with his PCP outpatient.  Return precautions provided.  Final Clinical Impression(s) / ED Diagnoses Final diagnoses:  Acute strain of neck muscle, initial encounter    Rx / DC Orders ED  Discharge Orders          Ordered    cyclobenzaprine (FLEXERIL) 10 MG tablet  2 times daily PRN        01/13/23 1656    meloxicam (MOBIC) 15 MG tablet  Daily        01/13/23 1656    lidocaine (LIDODERM) 5 %  Every 24 hours        01/13/23 1656              Regan Lemming, MD 01/13/23 1657

## 2023-01-13 NOTE — ED Provider Triage Note (Signed)
Emergency Medicine Provider Triage Evaluation Note  Preston Pennington , a 72 y.o. male  was evaluated in triage.  Pt complains of left-sided neck pain.  States began this afternoon.  Denies history of trauma, falls, quick movements.  Rates it at a 9 out of 10.  Describes it as sharp and shooting.  Does not radiate down either arm.  Worse with movement to the left.  Review of Systems  Positive: As above Negative: As above  Physical Exam  BP 137/82 (BP Location: Right Arm)   Pulse 76   Temp 98 F (36.7 C) (Oral)   Resp 18   Ht 5\' 7"  (1.702 m)   Wt 76.2 kg   SpO2 95%   BMI 26.31 kg/m  Gen:   Awake, no distress   Resp:  Normal effort  MSK:   Moves extremities without difficulty  Other:  No midline C-spine tenderness  Medical Decision Making  Medically screening exam initiated at 3:21 PM.  Appropriate orders placed.  Preston Pennington was informed that the remainder of the evaluation will be completed by another provider, this initial triage assessment does not replace that evaluation, and the importance of remaining in the ED until their evaluation is complete.     Preston Pennington, [place] 01/13/23 1523

## 2023-01-13 NOTE — ED Notes (Signed)
Pt is trying to figure out ride home.

## 2023-01-13 NOTE — Discharge Instructions (Addendum)
Recommend you try high-dose NSAIDs for the next few days, lidocaine patch for pain control in addition to Flexeril for muscle spasm.  Follow-up with your PCP.  Return for any numbness, weakness of your arms or legs, vision changes, new falls or trauma or severe neck pain with associated fever or any other concerns.

## 2023-01-15 DIAGNOSIS — Z8613 Personal history of malaria: Secondary | ICD-10-CM | POA: Insufficient documentation

## 2023-01-16 ENCOUNTER — Telehealth: Payer: Self-pay

## 2023-01-16 NOTE — Transitions of Care (Post Inpatient/ED Visit) (Signed)
   01/16/2023  Name: Preston Pennington MRN: [id number] DOB: 1950-12-24  Today's TOC FU Call Status: Today's TOC FU Call Status:: Unsuccessul Call (1st Attempt) Unsuccessful Call (1st Attempt) Date: 01/16/23 (Red on EMMI-ED Discharge Alert Date & Reason:  01/15/23-"Scheduled follow-up appt? No")  Attempted to reach the patient regarding the most recent Inpatient/ED visit.  Follow Up Plan: Additional outreach attempts will be made to reach the patient to complete the Transitions of Care (Post Inpatient/ED visit) call.   Enzo Montgomery, RN,BSN,CCM [facility] Care Management Care Management Community Coordinator Direct Phone: [phone] Toll Free: [phone] Fax: [phone]

## 2023-01-16 NOTE — Transitions of Care (Post Inpatient/ED Visit) (Signed)
   01/16/2023  Name: Preston Pennington MRN: [id number] DOB: [birth year]  Today's TOC FU Call Status: Today's TOC FU Call Status:: Successful TOC FU Call Competed TOC FU Call Complete Date: 01/16/23 (Incoming call from [place] RN CM call.)  Transition Care Management Follow-up Telephone Call Date of Discharge: 01/13/23 Discharge [facility]: [facility]) Reason for ED Visit: Other: ("acute strain of neck muscle") How have you been since you were released from the hospital?: Better (Patient reports that he is no longer having the neck pain-it has completely resolved.) Any questions or concerns?: No  Items Reviewed: Did you receive and understand the discharge instructions provided?: No Medications obtained and verified?: Yes (Medications Reviewed) Any new allergies since your discharge?: No Dietary orders reviewed?: NA Do you have support at home?: No  Home Care and Equipment/Supplies: [facility] Ordered?: NA Any new equipment or medical supplies ordered?: NA  Functional Questionnaire: Do you need assistance with bathing/showering or dressing?: No Do you need assistance with meal preparation?: No Do you need assistance with eating?: No Do you have difficulty maintaining continence: No Do you need assistance with getting out of bed/getting out of a chair/moving?: No Do you have difficulty managing or taking your medications?: No  Red on EMMI-ED Discharge Alert Date & Reason: 01/15/23 "Scheduled follow-up appt? No" Follow up appointments reviewed: PCP Follow-up appointment confirmed?: No (patient declined need for follow up since sxs completely resolved) Specialist Hospital Follow-up appointment confirmed?: NA Do you need transportation to your follow-up appointment?: No Do you understand care options if your condition(s) worsen?: Yes-patient verbalized understanding  SDOH Interventions Today    Flowsheet Row Most Recent Value  SDOH Interventions   Food  Insecurity Interventions Intervention Not Indicated  Transportation Interventions Intervention Not Indicated      TOC Interventions Today    Flowsheet Row Most Recent Value  TOC Interventions   TOC Interventions Discussed/Reviewed TOC Interventions Discussed      Interventions Today    Flowsheet Row Most Recent Value  Education Interventions   Education Provided Provided Education  Provided Verbal Education On Medication, When to see the doctor  Nutrition Interventions   Nutrition Discussed/Reviewed Nutrition Discussed  Pharmacy Interventions   Pharmacy Dicussed/Reviewed Pharmacy Topics Discussed, Medications and their functions        Hetty Blend [facility] Care Management Care Management Community Coordinator Direct Phone: [phone] Toll Free: [phone] Fax: [phone]

## 2023-01-19 ENCOUNTER — Ambulatory Visit (HOSPITAL_COMMUNITY)
Admission: RE | Admit: 2023-01-19 | Discharge: 2023-01-19 | Disposition: A | Discharge status: Home / Self Care | Payer: 59 | Source: Ambulatory Visit | Attending: Oncology | Admitting: Oncology

## 2023-01-19 DIAGNOSIS — D696 Thrombocytopenia, unspecified: Secondary | ICD-10-CM | POA: Diagnosis not present

## 2023-01-19 DIAGNOSIS — D709 Neutropenia, unspecified: Secondary | ICD-10-CM | POA: Insufficient documentation

## 2023-02-15 ENCOUNTER — Ambulatory Visit (INDEPENDENT_AMBULATORY_CARE_PROVIDER_SITE_OTHER): Payer: 59 | Admitting: Family

## 2023-02-15 ENCOUNTER — Encounter: Payer: Self-pay | Admitting: Family

## 2023-02-15 VITALS — BP 122/81 | HR 84 | Temp 97.6°F | Ht 67.0 in | Wt 158.6 lb

## 2023-02-15 DIAGNOSIS — G4452 New daily persistent headache (NDPH): Secondary | ICD-10-CM

## 2023-02-15 NOTE — Progress Notes (Signed)
Patient ID: Preston Pennington, male    DOB: June 29, 1951, 72 y.o.   MRN: [id number]  Chief Complaint  Patient presents with   Headache    Pt c/o headaches for about 4 days, Left sided. Occurs in the mornings when he wakes up. Has not tried anything for sx.   HPI:      Headaches:  reports pain on top left half side of his head, denies any eye pressure or pain, and has felt in the mornings up on awakening, rates 8/10 on pain scale, and it lasts for a few hours then goes away on its own, he has not taken any OTC med. Has checked his fasting CBG which has been 120-130, and his BP has been 130s/70s. Denies any nausea or dizziness, no sinus pain, pressure or forehead pain, no sinus drainage or congestion. Denies any recent med changes. Sleeping well, no increased stress, hydrating & eating well.  Assessment & Plan:  1. New persistent daily headache - advised pt take 1 extra strength Tylenol at bedtime and again in the morning if needed for pain for the next 5 days. Be sure to hydrate well, do not skip meals. Pt advised on when to seek ER r/t severe headache or s/s CVA. Advised to call back next week if HA has not ceased.  Subjective:    Outpatient Medications Prior to Visit  Medication Sig Dispense Refill   Accu-Chek Softclix Lancets lancets 4 (four) times daily.     acetaminophen (TYLENOL) 500 MG tablet Take 1,000 mg by mouth every 6 (six) hours as needed for moderate pain.      allopurinol (ZYLOPRIM) 100 MG tablet Take 1 tablet (100 mg total) by mouth daily. 90 tablet 3   amLODipine (NORVASC) 5 MG tablet Take 1 tablet (5 mg total) by mouth daily. 90 tablet 3   atorvastatin (LIPITOR) 10 MG tablet Take 1 tablet (10 mg total) by mouth 3 (three) times a week. 40 tablet 3   colchicine (COLCRYS) 0.6 MG tablet TAKE 2 CAPSULES BY MOUTH AT THE FIRST SIGN OF GOUT THEN TAKE 1 CAPSULE 2 HOURS LATER THEN 1 CAPSULE DAILY UNTIL FLARE IS RESOLVED 60 tablet 2   cyclobenzaprine (FLEXERIL) 10 MG tablet Take 1 tablet (10  mg total) by mouth 2 (two) times daily as needed for muscle spasms. 20 tablet 0   empagliflozin (JARDIANCE) 10 MG TABS tablet Take 1 tablet (10 mg total) by mouth daily before breakfast. 90 tablet 3   feeding supplement, ENSURE ENLIVE, (ENSURE ENLIVE) LIQD Take 1 Bottle by mouth daily.     gabapentin (NEURONTIN) 300 MG capsule TAKE 1 CAPSULE BY MOUTH IN THE MORNING AND AT BEDTIME 120 capsule 3   glipiZIDE (GLUCOTROL) 5 MG tablet TAKE 1 TABLET BY MOUTH TWICE DAILY BEFORE MEAL(S) 180 tablet 3   glucose blood (ACCU-CHEK GUIDE) test strip 1 each by Other route 4 (four) times daily. Dx: E11.9 100 each 0   latanoprost (XALATAN) 0.005 % ophthalmic solution Place 1 drop into both eyes at bedtime.      lidocaine (LIDODERM) 5 % Place 1 patch onto the skin daily. Remove & Discard patch within 12 hours or as directed by MD 30 patch 0   lisinopril (ZESTRIL) 20 MG tablet Take 1 tablet (20 mg total) by mouth daily. 90 tablet 3   metFORMIN (GLUCOPHAGE) 1000 MG tablet Take 1 tablet (1,000 mg total) by mouth 2 (two) times daily with a meal. 180 tablet 3   Multiple Vitamin (MULTIVITAMIN  ADULT PO) Take by mouth.     Polyethyl Glycol-Propyl Glycol (SYSTANE) 0.4-0.3 % SOLN Apply to eye. For dry eyes 3 x a day     Tetrahyd-Glyc-Hypro-PEG-ZnSulf (VISINE TOTALITY MULTI-SYMPTOM OP) Apply 1 drop to eye in the morning, at noon, and at bedtime.     traMADol (ULTRAM) 50 MG tablet Take 1 tablet (50 mg total) by mouth every 6 (six) hours as needed for moderate pain or severe pain (chronic foot pain. do not drive for 8 hours after taking.). 20 tablet 0   No facility-administered medications prior to visit.   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   Diabetes mellitus without complication    Difficult intubation    "mouth was not wide enough"   Dry eyes    History of adenomatous polyp of colon 11/06/2016   10/2016 adenoma x3 - recall colon 10/2019   Hyperlipidemia     Hypertension    Neuromuscular disorder    past hx tingling left arm    Prostate enlargement 2010   See's Urologist    Past Surgical History:  Procedure Laterality Date   COLONOSCOPY     COLONOSCOPY WITH PROPOFOL N/A 11/02/2016   Procedure: COLONOSCOPY WITH PROPOFOL;  Surgeon: Iva Boop, MD;  Location: [facility];  Service: Endoscopy;  Laterality: N/A;   COLONOSCOPY WITH PROPOFOL N/A 05/18/2020   Procedure: COLONOSCOPY WITH PROPOFOL;  Surgeon: Iva Boop, MD;  Location: [facility];  Service: Endoscopy;  Laterality: N/A;   LEG SURGERY Right 1997   accident related   XI ROBOTIC ASSISTED SIMPLE PROSTATECTOMY N/A 10/05/2017   Procedure: XI ROBOTIC ASSISTED SIMPLE PROSTATECTOMY WITH UMBILICAL HERNIA REPAIR;  Surgeon: Malen Gauze, MD;  Location: [facility];  Service: Urology;  Laterality: N/A;   No Known Allergies    Objective:    Physical Exam Vitals and nursing note reviewed.  Constitutional:      General: He is not in acute distress.    Appearance: Normal appearance.  HENT:     Head: Normocephalic.  Cardiovascular:     Rate and Rhythm: Normal rate and regular rhythm.  Pulmonary:     Effort: Pulmonary effort is normal.     Breath sounds: Normal breath sounds.  Musculoskeletal:        General: Normal range of motion.     Cervical back: Normal range of motion.  Skin:    General: Skin is warm and dry.  Neurological:     Mental Status: He is alert and oriented to person, place, and time.  Psychiatric:        Mood and Affect: Mood normal.   BP 122/81 (BP Location: Left Arm, Patient Position: Sitting, Cuff Size: Normal)   Pulse 84   Temp 97.6 F (36.4 C) (Temporal)   Ht  (1.702 m)   Wt 158 lb 9.6 oz (71.9 kg)   SpO2 99%   BMI 24.84 kg/m  Wt Readings from Last 3 Encounters:  02/15/23 158 lb 9.6 oz (71.9 kg)  01/13/23 168 lb (76.2 kg)  01/05/23 157 lb 8 oz (71.4 kg)      Dulce Sellar, NP

## 2023-02-15 NOTE — Patient Instructions (Addendum)
It was very nice to see you today!   I recommend you take 1  Tylenol at bedtime for the next 4-5 nights and see if this helps with your headache. Be sure you are hydrating well with water during the day - up to 2 liters = 8 cups daily. See the handout attached for more tips in preventing headaches. Call back next week if you are still waking up every morning with a headache. If you have severe pain (more than 10 on the pain scale) then you need to call 911! Or if you have any facial numbness, or difficulty with your speech.      PLEASE NOTE:  If you had any lab tests please let us know if you have not heard back within a few days. You may see your results on MyChart before we have a chance to review them but we will give you a call once they are reviewed by [place]. If we ordered any referrals today, please let us know if you have not heard from their office within the next week.

## 2023-02-16 ENCOUNTER — Telehealth: Payer: Self-pay | Admitting: Oncology

## 2023-02-20 NOTE — Progress Notes (Signed)
Preston Pennington is a 72 y.o. male here for a new problem.  History of Present Illness:   No chief complaint on file.   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   Diabetes mellitus without complication    Difficult intubation    "mouth was not wide enough"   Dry eyes    History of adenomatous polyp of colon 11/06/2016   10/2016 adenoma x3 - recall colon 10/2019   Hyperlipidemia    Hypertension    Neuromuscular disorder    past hx tingling left arm    Prostate enlargement 2010   See's Urologist      Social History   Tobacco Use   Smoking status: Never   Smokeless tobacco: Never  Vaping Use   Vaping Use: Never used  Substance Use Topics   Alcohol use: No    Alcohol/week: 0.0 standard drinks of alcohol   Drug use: No    Past Surgical History:  Procedure Laterality Date   COLONOSCOPY     COLONOSCOPY WITH PROPOFOL N/A 11/02/2016   Procedure: COLONOSCOPY WITH PROPOFOL;  Surgeon: Iva Boop, MD;  Location: [facility];  Service: Endoscopy;  Laterality: N/A;   COLONOSCOPY WITH PROPOFOL N/A 05/18/2020   Procedure: COLONOSCOPY WITH PROPOFOL;  Surgeon: Iva Boop, MD;  Location: [facility];  Service: Endoscopy;  Laterality: N/A;   LEG SURGERY Right 1997   accident related   XI ROBOTIC ASSISTED SIMPLE PROSTATECTOMY N/A 10/05/2017   Procedure: XI ROBOTIC ASSISTED SIMPLE PROSTATECTOMY WITH UMBILICAL HERNIA REPAIR;  Surgeon: Malen Gauze, MD;  Location: [facility];  Service: Urology;  Laterality: N/A;    Family History  Problem Relation Age of Onset   Other Father        and mother- states died of old age. States no medical problems in parents or siblings   Colon cancer Neg Hx    Colon polyps Neg Hx    Esophageal cancer Neg Hx    Rectal cancer Neg Hx    Stomach cancer Neg Hx     No Known Allergies  Current Medications:   Current Outpatient Medications:    Accu-Chek Softclix  Lancets lancets, 4 (four) times daily., Disp: , Rfl:    acetaminophen (TYLENOL) 500 MG tablet, Take 1,000 mg by mouth every 6 (six) hours as needed for moderate pain. , Disp: , Rfl:    allopurinol (ZYLOPRIM) 100 MG tablet, Take 1 tablet (100 mg total) by mouth daily., Disp: 90 tablet, Rfl: 3   amLODipine (NORVASC) 5 MG tablet, Take 1 tablet (5 mg total) by mouth daily., Disp: 90 tablet, Rfl: 3   atorvastatin (LIPITOR) 10 MG tablet, Take 1 tablet (10 mg total) by mouth 3 (three) times a week., Disp: 40 tablet, Rfl: 3   colchicine (COLCRYS) 0.6 MG tablet, TAKE 2 CAPSULES BY MOUTH AT THE FIRST SIGN OF GOUT THEN TAKE 1 CAPSULE 2 HOURS LATER THEN 1 CAPSULE DAILY UNTIL FLARE IS RESOLVED, Disp: 60 tablet, Rfl: 2   cyclobenzaprine (FLEXERIL) 10 MG tablet, Take 1 tablet (10 mg total) by mouth 2 (two) times daily as needed for muscle spasms., Disp: 20 tablet, Rfl: 0   empagliflozin (JARDIANCE) 10 MG TABS tablet, Take 1 tablet (10 mg total) by mouth daily before breakfast., Disp: 90 tablet, Rfl: 3   feeding supplement, ENSURE ENLIVE, (ENSURE ENLIVE) LIQD, Take 1 Bottle by mouth daily., Disp: , Rfl:  gabapentin (NEURONTIN) 300 MG capsule, TAKE 1 CAPSULE BY MOUTH IN THE MORNING AND AT BEDTIME, Disp: 120 capsule, Rfl: 3   glipiZIDE (GLUCOTROL) 5 MG tablet, TAKE 1 TABLET BY MOUTH TWICE DAILY BEFORE MEAL(S), Disp: 180 tablet, Rfl: 3   glucose blood (ACCU-CHEK GUIDE) test strip, 1 each by Other route 4 (four) times daily. Dx: E11.9, Disp: 100 each, Rfl: 0   latanoprost (XALATAN) 0.005 % ophthalmic solution, Place 1 drop into both eyes at bedtime. , Disp: , Rfl:    lidocaine (LIDODERM) 5 %, Place 1 patch onto the skin daily. Remove & Discard patch within 12 hours or as directed by MD, Disp: 30 patch, Rfl: 0   lisinopril (ZESTRIL) 20 MG tablet, Take 1 tablet (20 mg total) by mouth daily., Disp: 90 tablet, Rfl: 3   metFORMIN (GLUCOPHAGE) 1000 MG tablet, Take 1 tablet (1,000 mg total) by mouth 2 (two) times daily with a  meal., Disp: 180 tablet, Rfl: 3   Multiple Vitamin (MULTIVITAMIN ADULT PO), Take by mouth., Disp: , Rfl:    Polyethyl Glycol-Propyl Glycol (SYSTANE) 0.4-0.3 % SOLN, Apply to eye. For dry eyes 3 x a day, Disp: , Rfl:    Tetrahyd-Glyc-Hypro-PEG-ZnSulf (VISINE TOTALITY MULTI-SYMPTOM OP), Apply 1 drop to eye in the morning, at noon, and at bedtime., Disp: , Rfl:    traMADol (ULTRAM) 50 MG tablet, Take 1 tablet (50 mg total) by mouth every 6 (six) hours as needed for moderate pain or severe pain (chronic foot pain. do not drive for 8 hours after taking.)., Disp: 20 tablet, Rfl: 0   Review of Systems:   ROS  Vitals:   There were no vitals filed for this visit.   There is no height or weight on file to calculate BMI.  Physical Exam:   Physical Exam  Assessment and Plan:   ***   I,Alexander Ruley,acting as a scribe for Jarold Motto, PA.,have documented all relevant documentation on the behalf of Jarold Motto, PA,as directed by  Jarold Motto, PA while in the presence of Jarold Motto, [place].   ***   Jarold Motto, PA-C

## 2023-02-21 ENCOUNTER — Ambulatory Visit (HOSPITAL_COMMUNITY)
Admission: RE | Admit: 2023-02-21 | Discharge: 2023-02-21 | Disposition: A | Discharge status: Home / Self Care | Payer: 59 | Source: Ambulatory Visit | Attending: Physician Assistant | Admitting: Physician Assistant

## 2023-02-21 ENCOUNTER — Encounter: Payer: Self-pay | Admitting: Physician Assistant

## 2023-02-21 ENCOUNTER — Ambulatory Visit (INDEPENDENT_AMBULATORY_CARE_PROVIDER_SITE_OTHER): Payer: 59 | Admitting: Physician Assistant

## 2023-02-21 VITALS — BP 130/80 | HR 89 | Temp 97.5°F | Ht 67.0 in | Wt 153.2 lb

## 2023-02-21 DIAGNOSIS — G4452 New daily persistent headache (NDPH): Secondary | ICD-10-CM

## 2023-02-21 DIAGNOSIS — G319 Degenerative disease of nervous system, unspecified: Secondary | ICD-10-CM | POA: Diagnosis not present

## 2023-02-21 LAB — CBC WITH DIFFERENTIAL/PLATELET
Basophils Absolute: 0 10*3/uL (ref 0.0–0.1)
Basophils Relative: 0.8 % (ref 0.0–3.0)
Eosinophils Absolute: 0.2 10*3/uL (ref 0.0–0.7)
Eosinophils Relative: 4.6 % (ref 0.0–5.0)
HCT: 48.8 % (ref 39.0–52.0)
Hemoglobin: 15.4 g/dL (ref 13.0–17.0)
Lymphocytes Relative: 39 % (ref 12.0–46.0)
Lymphs Abs: 1.8 10*3/uL (ref 0.7–4.0)
MCHC: 31.6 g/dL (ref 30.0–36.0)
MCV: 81.5 fl (ref 78.0–100.0)
Monocytes Absolute: 0.3 10*3/uL (ref 0.1–1.0)
Monocytes Relative: 6.2 % (ref 3.0–12.0)
Neutro Abs: 2.3 10*3/uL (ref 1.4–7.7)
Neutrophils Relative %: 49.4 % (ref 43.0–77.0)
Platelets: 107 10*3/uL — ABNORMAL LOW (ref 150.0–400.0)
RBC: 5.99 Mil/uL — ABNORMAL HIGH (ref 4.22–5.81)
RDW: 14.3 % (ref 11.5–15.5)
WBC: 4.6 10*3/uL (ref 4.0–10.5)

## 2023-02-21 LAB — COMPREHENSIVE METABOLIC PANEL
ALT: 39 U/L (ref 0–53)
AST: 31 U/L (ref 0–37)
Albumin: 4.4 g/dL (ref 3.5–5.2)
Alkaline Phosphatase: 68 U/L (ref 39–117)
BUN: 22 mg/dL (ref 6–23)
CO2: 27 mEq/L (ref 19–32)
Calcium: 10 mg/dL (ref 8.4–10.5)
Chloride: 106 mEq/L (ref 96–112)
Creatinine, Ser: 1 mg/dL (ref 0.40–1.50)
GFR: 75.16 mL/min (ref >60.00)
Glucose, Bld: 214 mg/dL — ABNORMAL HIGH (ref 70–99)
Potassium: 4.6 mEq/L (ref 3.5–5.1)
Sodium: 144 mEq/L (ref 135–145)
Total Bilirubin: 0.5 mg/dL (ref 0.2–1.2)
Total Protein: 7 g/dL (ref 6.0–8.3)

## 2023-02-21 LAB — SEDIMENTATION RATE: Sed Rate: 8 mm/hr (ref 0–20)

## 2023-02-21 NOTE — Patient Instructions (Signed)
It was great to see you!  We are getting blood work and MRI today  Headache Precautions: Contact a doctor if: Your symptoms are not helped by medicine. You have a headache that feels different than the other headaches. You feel sick to your stomach (nauseous) or you throw up (vomit). You have a fever. Get help right away if: Your headache gets very bad quickly. Your headache gets worse after a lot of physical activity. You keep throwing up. You have a stiff neck. You have trouble seeing. You have trouble speaking. You have pain in the eye or ear. Your muscles are weak or you lose muscle control. You lose your balance or have trouble walking. You feel like you will pass out (faint) or you pass out. You are mixed up (confused). You have a seizure.

## 2023-03-09 ENCOUNTER — Inpatient Hospital Stay: Payer: 59

## 2023-03-09 ENCOUNTER — Inpatient Hospital Stay: Payer: 59 | Admitting: Oncology

## 2023-03-29 ENCOUNTER — Other Ambulatory Visit: Payer: Self-pay | Admitting: Oncology

## 2023-03-29 ENCOUNTER — Other Ambulatory Visit: Payer: Self-pay

## 2023-03-29 DIAGNOSIS — D696 Thrombocytopenia, unspecified: Secondary | ICD-10-CM

## 2023-03-29 DIAGNOSIS — D709 Neutropenia, unspecified: Secondary | ICD-10-CM
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
## 2023-03-30 ENCOUNTER — Inpatient Hospital Stay (HOSPITAL_BASED_OUTPATIENT_CLINIC_OR_DEPARTMENT_OTHER): Payer: 59 | Admitting: Oncology

## 2023-03-30 ENCOUNTER — Inpatient Hospital Stay: Payer: 59 | Attending: Oncology

## 2023-03-30 VITALS — BP 133/85 | HR 80 | Temp 97.6°F | Resp 18 | Ht 67.0 in | Wt 159.8 lb

## 2023-03-30 DIAGNOSIS — Z79899 Other long term (current) drug therapy: Secondary | ICD-10-CM | POA: Diagnosis not present

## 2023-03-30 DIAGNOSIS — M109 Gout, unspecified: Secondary | ICD-10-CM | POA: Insufficient documentation

## 2023-03-30 DIAGNOSIS — Z8613 Personal history of malaria: Secondary | ICD-10-CM

## 2023-03-30 DIAGNOSIS — D696 Thrombocytopenia, unspecified: Secondary | ICD-10-CM | POA: Insufficient documentation

## 2023-03-30 DIAGNOSIS — H409 Unspecified glaucoma: Secondary | ICD-10-CM | POA: Diagnosis not present

## 2023-03-30 DIAGNOSIS — K760 Fatty (change of) liver, not elsewhere classified: Secondary | ICD-10-CM | POA: Diagnosis not present

## 2023-03-30 DIAGNOSIS — I10 Essential (primary) hypertension: Secondary | ICD-10-CM | POA: Diagnosis not present

## 2023-03-30 DIAGNOSIS — N400 Enlarged prostate without lower urinary tract symptoms: Secondary | ICD-10-CM | POA: Diagnosis not present

## 2023-03-30 DIAGNOSIS — D709 Neutropenia, unspecified: Secondary | ICD-10-CM | POA: Insufficient documentation

## 2023-03-30 DIAGNOSIS — D72819 Decreased white blood cell count, unspecified: Secondary | ICD-10-CM | POA: Insufficient documentation

## 2023-03-30 DIAGNOSIS — E119 Type 2 diabetes mellitus without complications: Secondary | ICD-10-CM | POA: Diagnosis not present

## 2023-03-30 DIAGNOSIS — T504X5A Adverse effect of drugs affecting uric acid metabolism, initial encounter: Secondary | ICD-10-CM | POA: Diagnosis not present

## 2023-03-30 DIAGNOSIS — E785 Hyperlipidemia, unspecified: Secondary | ICD-10-CM | POA: Insufficient documentation

## 2023-03-30 DIAGNOSIS — Z7984 Long term (current) use of oral hypoglycemic drugs: Secondary | ICD-10-CM | POA: Diagnosis not present

## 2023-03-30 LAB — CBC WITH DIFFERENTIAL (CANCER CENTER ONLY)
Abs Immature Granulocytes: 0.01 10*3/uL (ref 0.00–0.07)
Basophils Absolute: 0 10*3/uL (ref 0.0–0.1)
Basophils Relative: 1 %
Eosinophils Absolute: 0.2 10*3/uL (ref 0.0–0.5)
Eosinophils Relative: 6 %
HCT: 48.7 % (ref 39.0–52.0)
Hemoglobin: 15.5 g/dL (ref 13.0–17.0)
Immature Granulocytes: 0 %
Lymphocytes Relative: 41 %
Lymphs Abs: 1.6 10*3/uL (ref 0.7–4.0)
MCH: 25.6 pg — ABNORMAL LOW (ref 26.0–34.0)
MCHC: 31.8 g/dL (ref 30.0–36.0)
MCV: 80.5 fL (ref 80.0–100.0)
Monocytes Absolute: 0.3 10*3/uL (ref 0.1–1.0)
Monocytes Relative: 7 %
Neutro Abs: 1.7 10*3/uL (ref 1.7–7.7)
Neutrophils Relative %: 45 %
Platelet Count: 109 10*3/uL — ABNORMAL LOW (ref 150–400)
RBC: 6.05 MIL/uL — ABNORMAL HIGH (ref 4.22–5.81)
RDW: 14 % (ref 11.5–15.5)
WBC Count: 3.9 10*3/uL — ABNORMAL LOW (ref 4.0–10.5)
nRBC: 0 % (ref 0.0–0.2)

## 2023-03-30 LAB — TYPE AND SCREEN
ABO/RH(D): AB POS
Antibody Screen: NEGATIVE

## 2023-03-31 LAB — ANGIOTENSIN CONVERTING ENZYME: Angiotensin-Converting Enzyme: 5 U/L — ABNORMAL LOW (ref 14–82)

## 2023-03-31 LAB — CMV IGM: CMV IgM: <30 AU/mL (ref 0.0–29.9)

## 2023-04-02 ENCOUNTER — Telehealth: Payer: Self-pay | Admitting: Oncology

## 2023-04-02 LAB — EPSTEIN BARR VRS(EBV DNA BY PCR): EBV DNA QN by PCR: NEGATIVE IU/mL

## 2023-04-02 LAB — CMV DNA BY PCR, QUALITATIVE: CMV DNA, Qual PCR: NEGATIVE

## 2023-04-02 LAB — COLD AGGLUTININ TITER: Cold Agglutinin Titer: NEGATIVE

## 2023-04-02 NOTE — Telephone Encounter (Signed)
Spoke with patient confirming upcoming appointment  

## 2023-04-05 LAB — QUANTIFERON-TB GOLD PLUS (RQFGPL)
QuantiFERON Mitogen Value: >10 IU/mL
QuantiFERON Nil Value: 0.02 IU/mL
QuantiFERON TB1 Ag Value: 0.03 IU/mL
QuantiFERON TB2 Ag Value: 0.03 IU/mL

## 2023-04-05 LAB — QUANTIFERON-TB GOLD PLUS: QuantiFERON-TB Gold Plus: NEGATIVE

## 2023-04-12 ENCOUNTER — Ambulatory Visit (INDEPENDENT_AMBULATORY_CARE_PROVIDER_SITE_OTHER): Payer: 59

## 2023-04-12 VITALS — BP 130/78 | HR 79 | Temp 97.3°F | Wt 155.8 lb

## 2023-04-12 DIAGNOSIS — Z0000 Encounter for general adult medical examination without abnormal findings: Secondary | ICD-10-CM | POA: Diagnosis not present

## 2023-04-12 NOTE — Patient Instructions (Signed)
Preston Pennington , Thank you for taking time to come for your Medicare Wellness Visit. I appreciate your ongoing commitment to your health goals. Please review the following plan we discussed and let me know if I can assist you in the future.   These are the goals we discussed:  Goals      Patient Stated     None at this time      Patient Stated     None at this time         This is a list of the screening recommended for you and due dates:  Health Maintenance  Topic Date Due   Complete foot exam   04/12/2023   COVID-19 Vaccine (7 - 2023-24 season) 02/21/2024*   Hemoglobin A1C  05/09/2023   Flu Shot  05/31/2023   Eye exam for diabetics  10/06/2023   Yearly kidney health urinalysis for diabetes  11/09/2023   Yearly kidney function blood test for diabetes  02/21/2024   Medicare Annual Wellness Visit  04/11/2024   Colon Cancer Screening  05/18/2025   DTaP/Tdap/Td vaccine (2 - Td or Tdap) 11/28/2025   Pneumonia Vaccine  Completed   Hepatitis C Screening  Completed   Zoster (Shingles) Vaccine  Completed   HPV Vaccine  Aged Out  *Topic was postponed. The date shown is not the original due date.    Advanced directives: Advance directive discussed with you today. Even though you declined this today please call our office should you change your mind and we can give you the proper paperwork for you to fill out.  Conditions/risks identified: stay  healthy   Next appointment: Follow up in one year for your annual wellness visit.   Preventive Care 73 Years and Older, Male  Preventive care refers to lifestyle choices and visits with your health care provider that can promote health and wellness. What does preventive care include? A yearly physical exam. This is also called an annual well check. Dental exams once or twice a year. Routine eye exams. Ask your health care provider how often you should have your eyes checked. Personal lifestyle choices, including: Daily care of your teeth and  gums. Regular physical activity. Eating a healthy diet. Avoiding tobacco and drug use. Limiting alcohol use. Practicing safe sex. Taking low doses of aspirin every day. Taking vitamin and mineral supplements as recommended by your health care provider. What happens during an annual well check? The services and screenings done by your health care provider during your annual well check will depend on your age, overall health, lifestyle risk factors, and family history of disease. Counseling  Your health care provider may ask you questions about your: Alcohol use. Tobacco use. Drug use. Emotional well-being. Home and relationship well-being. Sexual activity. Eating habits. History of falls. Memory and ability to understand (cognition). Work and work Astronomer. Screening  You may have the following tests or measurements: Height, weight, and BMI. Blood pressure. Lipid and cholesterol levels. These may be checked every 5 years, or more frequently if you are over 59 years old. Skin check. Lung cancer screening. You may have this screening every year starting at age 73 if you have a 30-pack-year history of smoking and currently smoke or have quit within the past 15 years. Fecal occult blood test (FOBT) of the stool. You may have this test every year starting at age 73. Flexible sigmoidoscopy or colonoscopy. You may have a sigmoidoscopy every 5 years or a colonoscopy every 10 years starting at  age 73. Prostate cancer screening. Recommendations will vary depending on your family history and other risks. Hepatitis C blood test. Hepatitis B blood test. Sexually transmitted disease (STD) testing. Diabetes screening. This is done by checking your blood sugar (glucose) after you have not eaten for a while (fasting). You may have this done every 1-3 years. Abdominal aortic aneurysm (AAA) screening. You may need this if you are a current or former smoker. Osteoporosis. You may be screened  starting at age 73 if you are at high risk. Talk with your health care provider about your test results, treatment options, and if necessary, the need for more tests. Vaccines  Your health care provider may recommend certain vaccines, such as: Influenza vaccine. This is recommended every year. Tetanus, diphtheria, and acellular pertussis (Tdap, Td) vaccine. You may need a Td booster every 10 years. Zoster vaccine. You may need this after age 73. Pneumococcal 13-valent conjugate (PCV13) vaccine. One dose is recommended after age 73. Pneumococcal polysaccharide (PPSV23) vaccine. One dose is recommended after age 73. Talk to your health care provider about which screenings and vaccines you need and how often you need them. This information is not intended to replace advice given to you by your health care provider. Make sure you discuss any questions you have with your health care provider. Document Released: 11/12/2015 Document Revised: 07/05/2016 Document Reviewed: 08/17/2015 Elsevier Interactive Patient Education  2017 [employer].  Fall Prevention in the Home Falls can cause injuries. They can happen to people of all ages. There are many things you can do to make your home safe and to help prevent falls. What can I do on the outside of my home? Regularly fix the edges of walkways and driveways and fix any cracks. Remove anything that might make you trip as you walk through a door, such as a raised step or threshold. Trim any bushes or trees on the path to your home. Use bright outdoor lighting. Clear any walking paths of anything that might make someone trip, such as rocks or tools. Regularly check to see if handrails are loose or broken. Make sure that both sides of any steps have handrails. Any raised decks and porches should have guardrails on the edges. Have any leaves, snow, or ice cleared regularly. Use sand or salt on walking paths during winter. Clean up any spills in your garage  right away. This includes oil or grease spills. What can I do in the bathroom? Use night lights. Install grab bars by the toilet and in the tub and shower. Do not use towel bars as grab bars. Use non-skid mats or decals in the tub or shower. If you need to sit down in the shower, use a plastic, non-slip stool. Keep the floor dry. Clean up any water that spills on the floor as soon as it happens. Remove soap buildup in the tub or shower regularly. Attach bath mats securely with double-sided non-slip rug tape. Do not have throw rugs and other things on the floor that can make you trip. What can I do in the bedroom? Use night lights. Make sure that you have a light by your bed that is easy to reach. Do not use any sheets or blankets that are too big for your bed. They should not hang down onto the floor. Have a firm chair that has side arms. You can use this for support while you get dressed. Do not have throw rugs and other things on the floor that can make  you trip. What can I do in the kitchen? Clean up any spills right away. Avoid walking on wet floors. Keep items that you use a lot in easy-to-reach places. If you need to reach something above you, use a strong step stool that has a grab bar. Keep electrical cords out of the way. Do not use floor polish or wax that makes floors slippery. If you must use wax, use non-skid floor wax. Do not have throw rugs and other things on the floor that can make you trip. What can I do with my stairs? Do not leave any items on the stairs. Make sure that there are handrails on both sides of the stairs and use them. Fix handrails that are broken or loose. Make sure that handrails are as long as the stairways. Check any carpeting to make sure that it is firmly attached to the stairs. Fix any carpet that is loose or worn. Avoid having throw rugs at the top or bottom of the stairs. If you do have throw rugs, attach them to the floor with carpet tape. Make  sure that you have a light switch at the top of the stairs and the bottom of the stairs. If you do not have them, ask someone to add them for you. What else can I do to help prevent falls? Wear shoes that: Do not have high heels. Have rubber bottoms. Are comfortable and fit you well. Are closed at the toe. Do not wear sandals. If you use a stepladder: Make sure that it is fully opened. Do not climb a closed stepladder. Make sure that both sides of the stepladder are locked into place. Ask someone to hold it for you, if possible. Clearly mark and make sure that you can see: Any grab bars or handrails. First and last steps. Where the edge of each step is. Use tools that help you move around (mobility aids) if they are needed. These include: Canes. Walkers. Scooters. Crutches. Turn on the lights when you go into a dark area. Replace any light bulbs as soon as they burn out. Set up your furniture so you have a clear path. Avoid moving your furniture around. If any of your floors are uneven, fix them. If there are any pets around you, be aware of where they are. Review your medicines with your doctor. Some medicines can make you feel dizzy. This can increase your chance of falling. Ask your doctor what other things that you can do to help prevent falls. This information is not intended to replace advice given to you by your health care provider. Make sure you discuss any questions you have with your health care provider. Document Released: 08/12/2009 Document Revised: 03/23/2016 Document Reviewed: 11/20/2014 Elsevier Interactive Patient Education  2017 [employer].

## 2023-04-12 NOTE — Progress Notes (Signed)
Subjective:   Preston Pennington is a 72 y.o. male who presents for Medicare Annual/Subsequent preventive examination.  Review of Systems     Cardiac Risk Factors include: advanced age (>60men, >75 women);diabetes mellitus;hypertension;dyslipidemia;male gender     Objective:    Today's Vitals   04/12/23 0805  BP: 130/78  Pulse: 79  Temp: (!) 97.3 F (36.3 C)  SpO2: 95%  Weight: 155 lb 12.8 oz (70.7 kg)   Body mass index is 24.4 kg/m.     04/12/2023    8:18 AM 01/13/2023    3:17 PM 12/08/2022   11:20 AM 04/13/2022   11:20 AM 04/08/2021   11:14 AM 05/18/2020    9:54 AM 12/25/2017    8:40 AM  Advanced Directives  Does Patient Have a Medical Advance Directive? No Yes No No No No No  Type of Advance Directive  Living will       Would patient like information on creating a medical advance directive? No - Patient declined  No - Patient declined No - Patient declined Yes (MAU/Ambulatory/Procedural Areas - Information given) No - Patient declined No - Patient declined    Current Medications (verified) Outpatient Encounter Medications as of 04/12/2023  Medication Sig   Accu-Chek Softclix Lancets lancets 4 (four) times daily.   acetaminophen (TYLENOL) 500 MG tablet Take 1,000 mg by mouth every 6 (six) hours as needed for moderate pain.    allopurinol (ZYLOPRIM) 100 MG tablet Take 1 tablet (100 mg total) by mouth daily.   amLODipine (NORVASC) 5 MG tablet Take 1 tablet (5 mg total) by mouth daily.   atorvastatin (LIPITOR) 10 MG tablet Take 1 tablet (10 mg total) by mouth 3 (three) times a week.   colchicine (COLCRYS) 0.6 MG tablet TAKE 2 CAPSULES BY MOUTH AT THE FIRST SIGN OF GOUT THEN TAKE 1 CAPSULE 2 HOURS LATER THEN 1 CAPSULE DAILY UNTIL FLARE IS RESOLVED   dorzolamide-timolol (COSOPT) 2-0.5 % ophthalmic solution Place 1 drop into both eyes 2 (two) times daily.   empagliflozin (JARDIANCE) 10 MG TABS tablet Take 1 tablet (10 mg total) by mouth daily before breakfast.   feeding supplement,  ENSURE ENLIVE, (ENSURE ENLIVE) LIQD Take 1 Bottle by mouth daily.   gabapentin (NEURONTIN) 300 MG capsule TAKE 1 CAPSULE BY MOUTH IN THE MORNING AND AT BEDTIME   glipiZIDE (GLUCOTROL) 5 MG tablet TAKE 1 TABLET BY MOUTH TWICE DAILY BEFORE MEAL(S)   glucose blood (ACCU-CHEK GUIDE) test strip 1 each by Other route 4 (four) times daily. Dx: E11.9   latanoprost (XALATAN) 0.005 % ophthalmic solution Place 1 drop into both eyes at bedtime.    lisinopril (ZESTRIL) 20 MG tablet Take 1 tablet (20 mg total) by mouth daily.   metFORMIN (GLUCOPHAGE) 1000 MG tablet Take 1 tablet (1,000 mg total) by mouth 2 (two) times daily with a meal.   Multiple Vitamin (MULTIVITAMIN ADULT PO) Take by mouth.   Polyethyl Glycol-Propyl Glycol (SYSTANE) 0.4-0.3 % SOLN Apply to eye. For dry eyes 3 x a day   Tetrahyd-Glyc-Hypro-PEG-ZnSulf (VISINE TOTALITY MULTI-SYMPTOM OP) Apply 1 drop to eye in the morning, at noon, and at bedtime.   traMADol (ULTRAM) 50 MG tablet Take 1 tablet (50 mg total) by mouth every 6 (six) hours as needed for moderate pain or severe pain (chronic foot pain. do not drive for 8 hours after taking.).   lidocaine (LIDODERM) 5 % Place 1 patch onto the skin daily. Remove & Discard patch within 12 hours or as directed by  MD (Patient not taking: Reported on 04/12/2023)   [DISCONTINUED] cyclobenzaprine (FLEXERIL) 10 MG tablet Take 1 tablet (10 mg total) by mouth 2 (two) times daily as needed for muscle spasms.   No facility-administered encounter medications on file as of 04/12/2023.    Allergies (verified) Patient has no known allergies.   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   Diabetes mellitus without complication (HCC)    Difficult intubation    "mouth was not wide enough"   Dry eyes    History of adenomatous polyp of colon 11/06/2016   10/2016 adenoma x3 - recall colon 10/2019   Hyperlipidemia    Hypertension    Neuromuscular  disorder (HCC)    past hx tingling left arm    Prostate enlargement 2010   See's Urologist    Past Surgical History:  Procedure Laterality Date   COLONOSCOPY     COLONOSCOPY WITH PROPOFOL N/A 11/02/2016   Procedure: COLONOSCOPY WITH PROPOFOL;  Surgeon: Iva Boop, MD;  Location: [facility];  Service: Endoscopy;  Laterality: N/A;   COLONOSCOPY WITH PROPOFOL N/A 05/18/2020   Procedure: COLONOSCOPY WITH PROPOFOL;  Surgeon: Iva Boop, MD;  Location: [facility];  Service: Endoscopy;  Laterality: N/A;   LEG SURGERY Right 1997   accident related   XI ROBOTIC ASSISTED SIMPLE PROSTATECTOMY N/A 10/05/2017   Procedure: XI ROBOTIC ASSISTED SIMPLE PROSTATECTOMY WITH UMBILICAL HERNIA REPAIR;  Surgeon: Malen Gauze, MD;  Location: [facility];  Service: Urology;  Laterality: N/A;   Family History  Problem Relation Age of Onset   Other Father        and mother- states died of old age. States no medical problems in parents or siblings   Colon cancer Neg Hx    Colon polyps Neg Hx    Esophageal cancer Neg Hx    Rectal cancer Neg Hx    Stomach cancer Neg Hx    Social History   Socioeconomic History   Marital status: Married    Spouse name: [name]   Number of children: 4   Years of education: Not on file   Highest education level: Not on file  Occupational History   Not on file  Tobacco Use   Smoking status: Never   Smokeless tobacco: Never  Vaping Use   Vaping Use: Never used  Substance and Sexual Activity   Alcohol use: No    Alcohol/week: 0.0 standard drinks of alcohol   Drug use: No   Sexual activity: Yes  Other Topics Concern   Not on file  Social History Narrative   Married. 4 children (1 boy, 3 girls). 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 Prior [facility]. Also did this in [place].       Hobbies: enjoys  reading novels. Enjoys African authors   Goes to [employer] of [address] ([place])   Social Determinants of Health   Financial Resource Strain: Low Risk  (04/12/2023)   Overall Financial Resource Strain (CARDIA)    Difficulty of Paying Living Expenses: Not hard at all  Food Insecurity: No Food Insecurity (04/12/2023)   Hunger Vital Sign    Worried About Running Out of Food in the Last Year: Never true    Ran Out of Food in  the Last Year: Never true  Transportation Needs: No Transportation Needs (04/12/2023)   PRAPARE - Administrator, Civil Service (Medical): No    Lack of Transportation (Non-Medical): No  Physical Activity: Inactive (04/12/2023)   Exercise Vital Sign    Days of Exercise per Week: 0 days    Minutes of Exercise per Session: 0 min  Stress: No Stress Concern Present (04/12/2023)   [employer] of Occupational Health - Occupational Stress Questionnaire    Feeling of Stress : Not at all  Social Connections: Socially Integrated (04/12/2023)   Social Connection and Isolation Panel [NHANES]    Frequency of Communication with Friends and Family: More than three times a week    Frequency of Social Gatherings with Friends and Family: More than three times a week    Attends Religious Services: More than 4 times per year    Active Member of [employer] or Organizations: Yes    Attends Banker Meetings: 1 to 4 times per year    Marital Status: Married    Tobacco Counseling Counseling given: Not Answered   Clinical Intake:  Pre-visit preparation completed: Yes  Pain : No/denies pain     BMI - recorded: 24.4 Nutritional Status: BMI of 19-24  Normal Nutritional Risks: None Diabetes: Yes CBG done?: Yes (112 per pt) CBG resulted in Enter/ Edit results?: No Did pt. bring in CBG monitor from home?: No  How often do you need to have someone help you when you read instructions, pamphlets, or other written materials from your doctor or  pharmacy?: 1 - Never  Diabetic?Nutrition Risk Assessment:  Has the patient had any N/V/D within the last 2 months?  No  Does the patient have any non-healing wounds?  No  Has the patient had any unintentional weight loss or weight gain?  No   Diabetes:  Is the patient diabetic?  Yes  If diabetic, was a CBG obtained today?  Yes  Did the patient bring in their glucometer from home?  No  How often do you monitor your CBG's? Daily .   Financial Strains and Diabetes Management:  Are you having any financial strains with the device, your supplies or your medication? No .  Does the patient want to be seen by Chronic Care Management for management of their diabetes?  No  Would the patient like to be referred to a Nutritionist or for Diabetic Management?  No   Diabetic Exams:  Diabetic Eye Exam: Completed 10/05/22 Diabetic Foot Exam: Overdue, Pt has been advised about the importance in completing this exam. Pt is scheduled for diabetic foot exam on next appt .   Interpreter Needed?: No  Information entered by :: Lanier Ensign, LPN   Activities of Daily Living    04/12/2023    8:19 AM 04/13/2022   11:22 AM  In your present state of health, do you have any difficulty performing the following activities:  Hearing? 1 1  Comment hearing aid wears hearing aids  Vision? 0 0  Difficulty concentrating or making decisions? 0 0  Walking or climbing stairs? 0 0  Dressing or bathing? 0 0  Doing errands, shopping? 0 0  Preparing Food and eating ? N N  Using the Toilet? N N  In the past six months, have you accidently leaked urine? N N  Do you have problems with loss of bowel control? N N  Managing your Medications? N N  Managing your Finances? N N  Housekeeping or managing  your Housekeeping? N N    Patient Care Team: [name], MD as PCP - General (Family Medicine) [name], MD as Consulting Physician (Ophthalmology)  Indicate any recent Medical Services you may have  received from other than [facility] providers in the past year (date may be approximate).     Assessment:   This is a routine wellness examination for Preston Pennington.  Hearing/Vision screen Hearing Screening - Comments:: Pt has hearing aids  Vision Screening - Comments:: Pt follows up with Dr [name] for annual eye exams   Dietary issues and exercise activities discussed: Current Exercise Habits: The patient does not participate in regular exercise at present   Goals Addressed             This Visit's Progress    Patient Stated       Stay healthy        Depression Screen    04/12/2023    8:15 AM 06/05/2022   11:09 AM 04/13/2022   11:19 AM 10/14/2021   10:22 AM 06/08/2021    9:18 AM 04/08/2021   11:11 AM 06/09/2020    9:50 AM  PHQ 2/9 Scores  PHQ - 2 Score 0 0 0 0 0 0 0    Fall Risk    04/12/2023    8:19 AM 06/05/2022   11:09 AM 04/13/2022   11:21 AM 10/14/2021   10:22 AM 06/08/2021    9:18 AM  Fall Risk   Falls in the past year? 0 0 0 0 0  Number falls in past yr: 0 0 0 0 0  Injury with Fall? 0 0 0 0 0  Risk for fall due to : Impaired vision No Fall Risks Impaired vision  No Fall Risks  Follow up Falls prevention discussed Falls evaluation completed Falls prevention discussed  Falls evaluation completed    FALL RISK PREVENTION PERTAINING TO THE HOME:  Any stairs in or around the home? Yes  If so, are there any without handrails? No  Home free of loose throw rugs in walkways, pet beds, electrical cords, etc? Yes  Adequate lighting in your home to reduce risk of falls? Yes   ASSISTIVE DEVICES UTILIZED TO PREVENT FALLS:  Life alert? No  Use of a cane, walker or w/c? No  Grab bars in the bathroom? Yes  Shower chair or bench in shower? No  Elevated toilet seat or a handicapped toilet? No   TIMED UP AND GO:  Was the test performed? Yes .  Length of time to ambulate 10 feet: 10 sec.   Gait steady and fast without use of assistive device  Cognitive Function:         04/12/2023    8:19 AM 04/13/2022   11:22 AM 04/08/2021   11:18 AM  6CIT Screen  What Year? 0 points 0 points 0 points  What month? 0 points 0 points 0 points  What time? 0 points 0 points 0 points  Count back from 20 0 points 0 points 0 points  Months in reverse 0 points 0 points 0 points  Repeat phrase 0 points 0 points 0 points  Total Score 0 points 0 points 0 points    Immunizations Immunization History  Administered Date(s) Administered   Fluad Quad(high Dose 65+) 07/10/2019, 07/27/2020   Hepatitis B 02/27/2021, 03/30/2021   Influenza Split 07/31/2011   Influenza, High Dose Seasonal PF 09/01/2017   Influenza-Unspecified 07/16/2015, 08/14/2018, 09/14/2021   Moderna Sars-Covid-2 Vaccination 10/27/2019, 11/24/2019, 09/16/2020, 03/17/2021, 07/19/2021  Research officer, trade [employer] 11yrs & up 06/28/2022   Pneumococcal Conjugate-13 03/29/2017, 02/12/2018   Pneumococcal Polysaccharide-23 07/31/2011, 07/10/2019, 02/24/2020   Tdap 11/29/2015   Zoster Recombinat (Shingrix) 02/12/2018, 05/01/2018    TDAP status: Up to date  Flu Vaccine status: Up to date  Pneumococcal vaccine status: Up to date  Covid-19 vaccine status: Completed vaccines  Qualifies for Shingles Vaccine? Yes   Zostavax completed Yes   Shingrix Completed?: Yes  Screening Tests Health Maintenance  Topic Date Due   FOOT EXAM  04/12/2023   COVID-19 Vaccine (7 - 2023-24 season) 02/21/2024 (Originally 08/23/2022)   HEMOGLOBIN A1C  05/09/2023   INFLUENZA VACCINE  05/31/2023   OPHTHALMOLOGY EXAM  10/06/2023   Diabetic kidney evaluation - Urine ACR  11/09/2023   Diabetic kidney evaluation - eGFR measurement  02/21/2024   Medicare Annual Wellness (AWV)  04/11/2024   Colonoscopy  05/18/2025   DTaP/Tdap/Td (2 - Td or Tdap) 11/28/2025   Pneumonia Vaccine 63+ Years old  Completed   Hepatitis C Screening  Completed   Zoster Vaccines- Shingrix  Completed   HPV VACCINES  Aged Out    Health  Maintenance  Health Maintenance Due  Topic Date Due   FOOT EXAM  04/12/2023    Colorectal cancer screening: Type of screening: Colonoscopy. Completed 05/18/20. Repeat every 5 years    Additional Screening:  Hepatitis C Screening:  Completed 12/08/22  Vision Screening: Recommended annual ophthalmology exams for early detection of glaucoma and other disorders of the eye. Is the patient up to date with their annual eye exam?  Yes  Who is the provider or what is the name of the office in which the patient attends annual eye exams? Dr Cathey Endow  If pt is not established with a provider, would they like to be referred to a provider to establish care? No .   Dental Screening: Recommended annual dental exams for proper oral hygiene  Community Resource Referral / Chronic Care Management: CRR required this visit?  No   CCM required this visit?  No      Plan:     I have personally reviewed and noted the following in the patient's chart:   Medical and social history Use of alcohol, tobacco or illicit drugs  Current medications and supplements including opioid prescriptions. Patient is currently taking opioid prescriptions. Information provided to patient regarding non-opioid alternatives. Patient advised to discuss non-opioid treatment plan with their provider. Functional ability and status Nutritional status Physical activity Advanced directives List of other physicians Hospitalizations, surgeries, and ER visits in previous 12 months Vitals Screenings to include cognitive, depression, and falls Referrals and appointments  In addition, I have reviewed and discussed with patient certain preventive protocols, quality metrics, and best practice recommendations. A written personalized care plan for preventive services as well as general preventive health recommendations were provided to patient.     Marzella Schlein, LPN   04/05/3015   Nurse Notes: none

## 2023-04-20 DIAGNOSIS — H04123 Dry eye syndrome of bilateral lacrimal glands: Secondary | ICD-10-CM | POA: Diagnosis not present

## 2023-04-20 DIAGNOSIS — H401131 Primary open-angle glaucoma, bilateral, mild stage: Secondary | ICD-10-CM | POA: Diagnosis not present

## 2023-04-27 ENCOUNTER — Other Ambulatory Visit: Payer: Self-pay | Admitting: Family Medicine

## 2023-05-17 ENCOUNTER — Encounter: Payer: Self-pay | Admitting: Family Medicine

## 2023-05-17 ENCOUNTER — Ambulatory Visit (INDEPENDENT_AMBULATORY_CARE_PROVIDER_SITE_OTHER): Payer: 59 | Admitting: Family Medicine

## 2023-05-17 VITALS — BP 130/78 | HR 78 | Temp 97.0°F | Ht 67.0 in | Wt 158.2 lb

## 2023-05-17 DIAGNOSIS — E1169 Type 2 diabetes mellitus with other specified complication: Secondary | ICD-10-CM

## 2023-05-17 DIAGNOSIS — I10 Essential (primary) hypertension: Secondary | ICD-10-CM

## 2023-05-17 DIAGNOSIS — Z125 Encounter for screening for malignant neoplasm of prostate: Secondary | ICD-10-CM

## 2023-05-17 DIAGNOSIS — E785 Hyperlipidemia, unspecified: Secondary | ICD-10-CM

## 2023-05-17 DIAGNOSIS — M720 Palmar fascial fibromatosis [Dupuytren]: Secondary | ICD-10-CM | POA: Diagnosis not present

## 2023-05-17 DIAGNOSIS — Z7984 Long term (current) use of oral hypoglycemic drugs: Secondary | ICD-10-CM | POA: Diagnosis not present

## 2023-05-17 DIAGNOSIS — E119 Type 2 diabetes mellitus without complications: Secondary | ICD-10-CM

## 2023-05-17 LAB — COMPREHENSIVE METABOLIC PANEL
ALT: 44 U/L (ref 0–53)
AST: 28 U/L (ref 0–37)
Albumin: 4.3 g/dL (ref 3.5–5.2)
Alkaline Phosphatase: 74 U/L (ref 39–117)
BUN: 25 mg/dL — ABNORMAL HIGH (ref 6–23)
CO2: 26 mEq/L (ref 19–32)
Calcium: 10.1 mg/dL (ref 8.4–10.5)
Chloride: 104 mEq/L (ref 96–112)
Creatinine, Ser: 0.96 mg/dL (ref 0.40–1.50)
GFR: 78.81 mL/min (ref >60.00)
Glucose, Bld: 179 mg/dL — ABNORMAL HIGH (ref 70–99)
Potassium: 4.3 mEq/L (ref 3.5–5.1)
Sodium: 140 mEq/L (ref 135–145)
Total Bilirubin: 0.5 mg/dL (ref 0.2–1.2)
Total Protein: 6.9 g/dL (ref 6.0–8.3)

## 2023-05-17 LAB — HEMOGLOBIN A1C: Hgb A1c MFr Bld: 6.9 % — ABNORMAL HIGH (ref 4.6–6.5)

## 2023-05-17 LAB — LIPID PANEL
Cholesterol: 135 mg/dL (ref 0–200)
HDL: 36.8 mg/dL — ABNORMAL LOW (ref >39.00)
LDL Cholesterol: 79 mg/dL (ref 0–99)
NonHDL: 98.6
Total CHOL/HDL Ratio: 4
Triglycerides: 98 mg/dL (ref 0.0–149.0)
VLDL: 19.6 mg/dL (ref 0.0–40.0)

## 2023-05-17 LAB — PSA, MEDICARE: PSA: 1.67 ng/ml (ref 0.10–4.00)

## 2023-05-17 NOTE — Patient Instructions (Addendum)
Please stop by lab before you go If you have mychart- we will send your results within 3 business days of [place] receiving them.  If you do not have mychart- we will call you about results within 5 business days of [place] receiving them.  *please also note that you will see labs on mychart as soon as they post. I will later go in and write notes on them- will say "notes from Dr. Durene Cal"   We have placed a referral for you today to sports medicine. In some cases you will see # listed below- you can call this if you have not heard within a week. If you do not see # listed- you should receive a mychart message or phone call within a week with the # to call. Reach out to [place] if you ar enot scheduled within 2 weeks  Glad you are doing well overall!   Recommended follow up: Return in about 6 months (around 11/17/2023) for physical or sooner if needed.Schedule b4 you leave.

## 2023-05-17 NOTE — Progress Notes (Signed)
Phone [phone] In person visit   Subjective:   Preston Pennington is a 72 y.o. 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 03/31/2019    Priority: Medium    Hyperlipidemia associated with type 2 diabetes mellitus (HCC) 07/09/2018    Priority: Medium    Left cervical radiculopathy 09/24/2017    Priority: Medium    Hx of colonic polyps 11/06/2016    Priority: Medium    Chronic ankle pain 06/14/2016    Priority: Medium    Thrombocytopenia (HCC) 11/29/2015    Priority: Medium    Gout 04/30/2015    Priority: Medium    Essential hypertension 04/09/2013    Priority: Medium    BPH (benign prostatic hyperplasia) 04/09/2013    Priority: Medium    Leg length difference, acquired 06/20/2019    Priority: Low   Arthritis of first metatarsophalangeal (MTP) joint of right foot 05/30/2019    Priority: Low   Sesamoiditis 11/08/2018    Priority: Low   Osteoarthritis of spine with radiculopathy, cervical region 11/14/2017    Priority: Low   Right knee pain 11/21/2016    Priority: Low   Erectile dysfunction 02/04/2015    Priority: Low   Steatosis of liver 03/30/2023   History of malaria 01/15/2023   Colchicine adverse reaction 12/18/2022   Neutropenia (HCC) 12/08/2022   Dupuytren's contracture of right hand 06/13/2021    Medications- reviewed and updated Current Outpatient Medications  Medication Sig Dispense Refill   ACCU-CHEK GUIDE test strip USE 1 TEST STRIP 4 TIMES DAILY 100 each 0   Accu-Chek Softclix Lancets lancets 4 (four) times daily.     acetaminophen (TYLENOL) 500 MG tablet Take 1,000 mg by mouth every 6 (six) hours as needed for moderate pain.       allopurinol (ZYLOPRIM) 100 MG tablet Take 1 tablet (100 mg total) by mouth daily. 90 tablet 3   amLODipine (NORVASC) 5 MG tablet Take 1 tablet (5 mg total) by mouth daily. 90 tablet 3   atorvastatin (LIPITOR) 10 MG tablet Take 1 tablet (10 mg total) by mouth 3 (three) times a week. 40 tablet 3   colchicine (COLCRYS) 0.6 MG tablet TAKE 2 CAPSULES BY MOUTH AT THE FIRST SIGN OF GOUT THEN TAKE 1 CAPSULE 2 HOURS LATER THEN 1 CAPSULE DAILY UNTIL FLARE IS RESOLVED 60 tablet 2   dorzolamide-timolol (COSOPT) 2-0.5 % ophthalmic solution Place 1 drop into both eyes 2 (two) times daily.     empagliflozin (JARDIANCE) 10 MG TABS tablet Take 1 tablet (10 mg total) by mouth daily before breakfast. 90 tablet 3   feeding supplement, ENSURE ENLIVE, (ENSURE ENLIVE) LIQD Take 1 Bottle by mouth daily.     gabapentin (NEURONTIN) 300 MG capsule TAKE 1 CAPSULE BY MOUTH IN THE MORNING AND AT BEDTIME 120 capsule 3   glipiZIDE (GLUCOTROL) 5 MG tablet TAKE 1 TABLET BY MOUTH TWICE DAILY BEFORE MEAL(S) 180 tablet 3   latanoprost (XALATAN) 0.005 % ophthalmic solution Place 1 drop into both eyes at bedtime.      lidocaine (LIDODERM) 5 % Place 1 patch onto the skin daily. Remove & Discard patch within 12 hours or as directed by MD (  Patient not taking: Reported on 04/12/2023) 30 patch 0   lisinopril (ZESTRIL) 20 MG tablet Take 1 tablet (20 mg total) by mouth daily. 90 tablet 3   metFORMIN (GLUCOPHAGE) 1000 MG tablet Take 1 tablet (1,000 mg total) by mouth 2 (two) times daily with a meal. 180 tablet 3   Multiple Vitamin (MULTIVITAMIN ADULT PO) Take by mouth.     Polyethyl Glycol-Propyl Glycol (SYSTANE) 0.4-0.3 % SOLN Apply to eye. For dry eyes 3 x a day     Tetrahyd-Glyc-Hypro-PEG-ZnSulf (VISINE TOTALITY MULTI-SYMPTOM OP) Apply 1 drop to eye in the morning, at noon, and at bedtime.     No current facility-administered medications for this visit.     Objective:  BP 130/78 Comment: most recent home reading  Pulse 78   Temp (!) 97  F (36.1 C)   Ht 5\' 7"  (1.702 m)   Wt 158 lb 3.2 oz (71.8 kg)   SpO2 96%   BMI 24.78 kg/m  Gen: NAD, resting comfortably CV: RRR no murmurs rubs or gallops Lungs: CTAB no crackles, wheeze, rhonchi Ext: no edema Skin: warm, dry  Diabetic Foot Exam - Simple   Simple Foot Form Diabetic Foot exam was performed with the following findings: Yes 05/17/2023  8:46 AM  Visual Inspection No deformities, no ulcerations, no other skin breakdown bilaterally: Yes Sensation Testing Intact to touch and monofilament testing bilaterally: Yes Pulse Check Posterior Tibialis and Dorsalis pulse intact bilaterally: Yes Comments       Assessment and Plan   #Dupuytren contracture right hand- saw Dr. Denyse Amass 2022 and had injection- was helpful- feeling some discomfort- wants to see sports medicine again- refer today  # Diabetes S: compliant with on Metformin 1000 mg BID and Glipizide 5 mg twice daily and jardiance 10 mg   CBGs- morning sugars this month from 130s to 180's probably centers around 140 or 150. Trending up in recent months Exercise and diet- limited exercise Lab Results  Component Value Date   HGBA1C 6.4 11/08/2022   HGBA1C 6.2 04/11/2022   HGBA1C 6.5 10/14/2021   A/P: I am concerned a1c may be higher- we opted to update a1c with labs today- if trending up we could potentially increase jardiance to 25 mg  #hypertension S: compliant with Amlodipine 5 mg once daily and lisinopril 20 mg daily.  - 130/78 most recent home reading- 130/80 today BP Readings from Last 3 Encounters:  05/17/23 130/78  04/12/23 130/78  03/30/23 133/85  A/P: stable- continue current medicines   #hyperlipidemia S: compliant with Atorvastatin 10 mg 3 times weekly.  Attempt increase to twice weekly September 2021 Lab Results  Component Value Date   CHOL 99 04/11/2022   HDL 33.90 (L) 04/11/2022   LDLCALC 54 04/11/2022   LDLDIRECT 68.0 10/14/2021   TRIG 56.0 04/11/2022   CHOLHDL 3 04/11/2022   A/P:  hopefully stable- update lipid panel today. Continue current meds for now   % BPH status post robotic prostatectomy December 2018- Followed in past with Dr. Ronne Binning at [facility] urology.   For elevated PSA-biopsy in 2017 reassuring. Hed like checked again today Lab Results  Component Value Date   PSA 1.34 04/11/2022   PSA 1.51 01/26/2021   PSA 2.09 11/25/2019   # Gout S: compliant with Allopurinol 100 mg daily and Colchicine 0.6 mg prn.  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 Future Appointments  Date Time Provider Department Center  10/05/2023 10:00 AM [facility] LAB [facility] None  10/05/2023 10:30 AM Ribakove, Cindie Crumbly, MD [facility] None  04/17/2024  8:00 AM [facility] ANNUAL WELLNESS VISIT 1 [facility]    Lab/Order associations: NOT fasting   ICD-10-CM   1. Controlled type 2 diabetes mellitus without complication, without long-term current use of insulin (HCC)  E11.9     2. Essential hypertension  I10     3. Hyperlipidemia associated with type 2 diabetes mellitus (HCC)  E11.69    E78.5     4. Dupuytren's contracture of right hand  M72.0     5. Screening for prostate cancer  Z12.5      No orders of the defined types were placed in this encounter.  Return  precautions advised.  Tana Conch, MD

## 2023-05-22 ENCOUNTER — Other Ambulatory Visit: Payer: Self-pay | Admitting: Family Medicine

## 2023-05-31 ENCOUNTER — Other Ambulatory Visit: Payer: Self-pay

## 2023-05-31 ENCOUNTER — Encounter: Payer: Self-pay | Admitting: Family Medicine

## 2023-05-31 ENCOUNTER — Ambulatory Visit (INDEPENDENT_AMBULATORY_CARE_PROVIDER_SITE_OTHER): Payer: 59 | Admitting: Family Medicine

## 2023-05-31 VITALS — BP 126/78 | HR 83 | Ht 67.0 in | Wt 158.0 lb

## 2023-05-31 DIAGNOSIS — M79641 Pain in right hand: Secondary | ICD-10-CM | POA: Insufficient documentation

## 2023-05-31 DIAGNOSIS — M720 Palmar fascial fibromatosis [Dupuytren]: Secondary | ICD-10-CM | POA: Diagnosis not present

## 2023-05-31 MED ORDER — METHYLPREDNISOLONE ACETATE 80 MG/ML IJ SUSP
80.0000 mg | Freq: Once | INTRAMUSCULAR | Status: AC
  Administered 2023-05-31: 80 mg via INTRALESIONAL

## 2023-05-31 NOTE — Patient Instructions (Signed)
Thank you for coming in today.   Call or go to the ER if you develop a large red swollen joint with extreme pain or oozing puss.    Return as needed.  

## 2023-05-31 NOTE — Progress Notes (Signed)
   I, Stevenson Clinch, CMA acting as a scribe for Clementeen Graham, MD.  Preston Pennington is a 72 y.o. male who presents to [facility] today for exacerbation of his R hand pain. Pt was last seen by Dr. Denyse Amass on 06/13/21 and was given a R 4th Dupuytren's nodule steroid injection and was referred for [facility] for hand therapy. He had a prior R 4th MC aspiration and injection on 04/15/20.  Today, pt reports worsening hand sx over the past 2 weeks. Pt locates pain to the palmer aspect of the hand. Palpable mass and pain present. Denies pain in the fingers. Has been using topical Diclofenac prn.   Dx imaging: 06/13/21 R hand XR  Pertinent review of systems: No fever or chills  Relevant historical information: Hypertension and diabetes   Exam:  BP 126/78   Pulse 83   Ht 5\' 7"  (1.702 m)   Wt 158 lb (71.7 kg)   SpO2 96%   BMI 24.75 kg/m  General: Well Developed, well nourished, and in no acute distress.   MSK: Right hand small nodule palmar aspect hand at fourth metacarpal    Lab and Radiology Results  Procedure: Real-time Ultrasound Guided Injection of Dupuytren nodule overlying the distal fourth metacarpal right hand Device: Philips Affiniti 50G/GE Logiq Images permanently stored and available for review in PACS Verbal informed consent obtained.  Discussed risks and benefits of procedure. Warned about infection, bleeding, hyperglycemia damage to structures among others. Patient expresses understanding and agreement Time-out conducted.   Noted no overlying erythema, induration, or other signs of local infection.   Skin prepped in a sterile fashion.   Local anesthesia: Topical Ethyl chloride.   With sterile technique and under real time ultrasound guidance: 0.5 mL of 80 mg/mL Depo-Medrol and 0.5 mL of lidocaine injected into and around the nodule right hand overlying the fourth distal metacarpal. Fluid seen entering the nodule.   Completed without  difficulty   Pain immediately resolved suggesting accurate placement of the medication.   Advised to call if fevers/chills, erythema, induration, drainage, or persistent bleeding.   Images permanently stored and available for review in the ultrasound unit.  Impression: Technically successful ultrasound guided injection.        Assessment and Plan: 72 y.o. male with right hand Dupuytren's contracture nodule.  He did extremely well with a steroid injection to this nodule 2 years ago.  Will go ahead and repeat that injection today.  If this is not working well would recommend referral to hand surgery.   PDMP not reviewed this encounter. Orders Placed This Encounter  Procedures   [place] LIMITED JOINT SPACE STRUCTURES UP RIGHT(NO LINKED CHARGES)    Order Specific Question:   Reason for Exam (SYMPTOM  OR DIAGNOSIS REQUIRED)    Answer:   right hand pain    Order Specific Question:   Preferred imaging location?    Answer:   [facility] ordered this encounter  Medications   methylPREDNISolone acetate (DEPO-MEDROL) injection 80 mg     Discussed warning signs or symptoms. Please see discharge instructions. Patient expresses understanding.   The above documentation has been reviewed and is accurate and complete Clementeen Graham, M.D.

## 2023-06-15 ENCOUNTER — Other Ambulatory Visit: Payer: Self-pay | Admitting: Family Medicine

## 2023-07-09 ENCOUNTER — Other Ambulatory Visit: Payer: Self-pay | Admitting: Family Medicine

## 2023-07-16 ENCOUNTER — Other Ambulatory Visit: Payer: Self-pay

## 2023-07-16 MED ORDER — ACCU-CHEK GUIDE VI STRP: ORAL_STRIP | 0 refills | Status: DC

## 2023-07-20 DIAGNOSIS — H401131 Primary open-angle glaucoma, bilateral, mild stage: Secondary | ICD-10-CM | POA: Diagnosis not present

## 2023-07-20 DIAGNOSIS — E119 Type 2 diabetes mellitus without complications: Secondary | ICD-10-CM | POA: Diagnosis not present

## 2023-08-21 ENCOUNTER — Other Ambulatory Visit: Payer: Self-pay | Admitting: Family Medicine

## 2023-09-20 ENCOUNTER — Telehealth: Payer: Self-pay | Admitting: Family Medicine

## 2023-09-20 NOTE — Telephone Encounter (Signed)
Pt would like a call back. He has questions about a med.

## 2023-09-21 NOTE — Telephone Encounter (Signed)
Tried to reach pt and mail box is full.

## 2023-09-24 ENCOUNTER — Other Ambulatory Visit: Payer: Self-pay

## 2023-09-26 ENCOUNTER — Other Ambulatory Visit: Payer: Self-pay | Admitting: Family Medicine

## 2023-10-04 ENCOUNTER — Other Ambulatory Visit: Payer: Self-pay | Admitting: Oncology

## 2023-10-04 DIAGNOSIS — D709 Neutropenia, unspecified: Secondary | ICD-10-CM

## 2023-10-05 ENCOUNTER — Inpatient Hospital Stay: Payer: 59

## 2023-10-05 ENCOUNTER — Inpatient Hospital Stay: Payer: 59 | Attending: Oncology | Admitting: Oncology

## 2023-10-05 VITALS — BP 136/92 | HR 72 | Temp 97.6°F | Resp 14 | Wt 155.9 lb

## 2023-10-05 DIAGNOSIS — E119 Type 2 diabetes mellitus without complications: Secondary | ICD-10-CM | POA: Insufficient documentation

## 2023-10-05 DIAGNOSIS — E785 Hyperlipidemia, unspecified: Secondary | ICD-10-CM | POA: Insufficient documentation

## 2023-10-05 DIAGNOSIS — I10 Essential (primary) hypertension: Secondary | ICD-10-CM | POA: Diagnosis not present

## 2023-10-05 DIAGNOSIS — Z9079 Acquired absence of other genital organ(s): Secondary | ICD-10-CM | POA: Insufficient documentation

## 2023-10-05 DIAGNOSIS — Z860101 Personal history of adenomatous and serrated colon polyps: Secondary | ICD-10-CM | POA: Diagnosis not present

## 2023-10-05 DIAGNOSIS — D696 Thrombocytopenia, unspecified: Secondary | ICD-10-CM

## 2023-10-05 DIAGNOSIS — N400 Enlarged prostate without lower urinary tract symptoms: Secondary | ICD-10-CM | POA: Diagnosis not present

## 2023-10-05 DIAGNOSIS — Z7984 Long term (current) use of oral hypoglycemic drugs: Secondary | ICD-10-CM | POA: Diagnosis not present

## 2023-10-05 DIAGNOSIS — Z79899 Other long term (current) drug therapy: Secondary | ICD-10-CM | POA: Diagnosis not present

## 2023-10-05 DIAGNOSIS — D72819 Decreased white blood cell count, unspecified: Secondary | ICD-10-CM | POA: Insufficient documentation

## 2023-10-05 DIAGNOSIS — D709 Neutropenia, unspecified: Secondary | ICD-10-CM | POA: Diagnosis not present

## 2023-10-05 DIAGNOSIS — M109 Gout, unspecified: Secondary | ICD-10-CM | POA: Insufficient documentation

## 2023-10-05 DIAGNOSIS — R682 Dry mouth, unspecified: Secondary | ICD-10-CM

## 2023-10-05 LAB — CBC WITH DIFFERENTIAL/PLATELET
Abs Immature Granulocytes: 0.01 10*3/uL (ref 0.00–0.07)
Basophils Absolute: 0 10*3/uL (ref 0.0–0.1)
Basophils Relative: 1 %
Eosinophils Absolute: 0.3 10*3/uL (ref 0.0–0.5)
Eosinophils Relative: 6 %
HCT: 47.1 % (ref 39.0–52.0)
Hemoglobin: 15.3 g/dL (ref 13.0–17.0)
Immature Granulocytes: 0 %
Lymphocytes Relative: 42 %
Lymphs Abs: 2.1 10*3/uL (ref 0.7–4.0)
MCH: 26.6 pg (ref 26.0–34.0)
MCHC: 32.5 g/dL (ref 30.0–36.0)
MCV: 81.8 fL (ref 80.0–100.0)
Monocytes Absolute: 0.4 10*3/uL (ref 0.1–1.0)
Monocytes Relative: 8 %
Neutro Abs: 2 10*3/uL (ref 1.7–7.7)
Neutrophils Relative %: 43 %
Platelets: 114 10*3/uL — ABNORMAL LOW (ref 150–400)
RBC: 5.76 MIL/uL (ref 4.22–5.81)
RDW: 13.4 % (ref 11.5–15.5)
WBC: 4.8 10*3/uL (ref 4.0–10.5)
nRBC: 0 % (ref 0.0–0.2)

## 2023-10-05 LAB — COMPREHENSIVE METABOLIC PANEL
ALT: 58 U/L — ABNORMAL HIGH (ref 0–44)
AST: 33 U/L (ref 15–41)
Albumin: 4.7 g/dL (ref 3.5–5.0)
Alkaline Phosphatase: 65 U/L (ref 38–126)
Anion gap: 6 (ref 5–15)
BUN: 23 mg/dL (ref 8–23)
CO2: 29 mmol/L (ref 22–32)
Calcium: 10.2 mg/dL (ref 8.9–10.3)
Chloride: 104 mmol/L (ref 98–111)
Creatinine, Ser: 1.04 mg/dL (ref 0.61–1.24)
GFR, Estimated: >60 mL/min (ref >60)
Glucose, Bld: 144 mg/dL — ABNORMAL HIGH (ref 70–99)
Potassium: 4.4 mmol/L (ref 3.5–5.1)
Sodium: 139 mmol/L (ref 135–145)
Total Bilirubin: 0.7 mg/dL (ref <1.2)
Total Protein: 7.3 g/dL (ref 6.5–8.1)

## 2023-10-05 LAB — FOLATE: Folate: 27.3 ng/mL (ref >5.9)

## 2023-10-05 LAB — LACTATE DEHYDROGENASE: LDH: 170 U/L (ref 98–192)

## 2023-10-05 LAB — VITAMIN B12: Vitamin B-12: 834 pg/mL (ref 180–914)

## 2023-10-05 NOTE — Assessment & Plan Note (Addendum)
Long-standing, stable mild thrombocytopenia and leukopenia. No concerning findings on extensive workup including hematologic, rheumatological and infectious disease testing. No evidence of immature cells or blasts in the blood. No symptoms of bleeding or bruising.  -Labs today showed stable platelet count of 114,000.  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  However no hematological intervention would be warranted at current platelet levels.  -Continue monitoring with blood work every 6 months.

## 2023-10-05 NOTE — Progress Notes (Signed)
[facility]  HEMATOLOGY/ONCOLOGY PROGRESS NOTE  Patient Care Team: [name], MD as PCP - General (Family Medicine) [name], MD as Consulting Physician (Ophthalmology)  CHIEF COMPLAINT/ REASON FOR VISIT:  Follow up for history of leukopenia and thrombocytopenia.  HISTORY OF PRESENTING ILLNESS:   Preston Pennington is a 72 y.o. gentleman who was transferred to my care after his prior physician has left.  He is being followed in our clinic for history of leukopenia and thrombocytopenia.   Past medical history is also notable for adenomatous colon polyps, hypertension, hyperlipidemia, diabetes mellitus type 2, glaucoma, enlarged prostate, neuromuscular disorder, Dupuytren's contracture, gout.   I reviewed the patient's records extensively and collaborated the history with the patient. Summary of his history is as follows:  November 08, 2022: WBC 3.7 hemoglobin 14.5 platelet count 97; 39 segs 45 lymphs 10 monos 5 eos 1 basophil ANC 1.5 CMP notable for glucose of 170   December 08 2022:  [facility] Patient has frequent gout attacks that typically involve his right foot.  States that he has been on colchicine for about 8 years.  Claims that DM Type II is well controlled. He states that his PLT count has been low for at least 15 yrs.  No bleeding problems.   Social:  Originally from [place].  Works in a nursing home.  Married.  Tobacco none.  EtOH none.     WBC 3.6 hemoglobin 14.7 MCV 81 platelet count 93; 40 segs 45 lymphs 9 monos 5 eos 1 basophil.  ANC 1.4.  Smear for morphology review notable for large platelets. Coombs test negative. Haptoglobin 109 Fibrinogen 242 INR 1.0 SPEP with IP showed no paraprotein IgG 1120 IgA 169 IgM 67 ANA panel negative.  Rheumatoid factor negative Hepatitis panel negative.  HIV negative Folate 16.6. Copper 93, B12 694, zinc 88   December 27, 2022: Ultrasound of left breast including axilla.  Oval area of  subtle ill-defined increased echogenicity within the subcutaneous fat lobule at 9 o'clock position left breast 1 cm from nipple measuring 3.8 x 2.8 x 0.8.  Contains 4 small cystic areas.  Impression was evolution of an area of fat necrosis with developing oil cyst at 9 o'clock position no evidence of malignancy    January 19, 2023: Abdominal ultrasound spleen normal in size and appearance.  Liver mildly echogenic.  Compatible with hepatic steatosis   February 21, 2023: WBC 4.6 hemoglobin 15.4 platelet count 107 differential normal.  Glucose 214   Mar 30 2023:   Scheduled follow up for thrombocytopenia.  Reviewed results of labs and U/S with patient   No bleeding issues.  Platelet 109,000.  CMV, EBV, QuantiFERON testing, cold agglutinin titer were all negative.  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  Medications including allopurinol and colchicine could be contributing.  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    INTERVAL HISTORY:   The patient's blood counts have been fluctuating for several years but have mostly been within or close to the normal range. The patient reports dry mouth and uses medication for dry eyes. The patient does not report any bleeding or bruising. The patient takes a daily multivitamin and has not noticed any obvious blood loss. The patient's colonoscopy was up to date. The patient also reports a dry mouth and uses medication for dry eyes.  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  Diabetes mellitus without complication (HCC)    Difficult intubation    "mouth was not wide enough"   Dry eyes    History of adenomatous polyp of colon 11/06/2016   10/2016 adenoma x3 - recall colon 10/2019   Hyperlipidemia    Hypertension    Neuromuscular disorder (HCC)    past hx tingling left arm    Prostate enlargement 2010   See's Urologist     SURGICAL HISTORY: Past Surgical History:   Procedure Laterality Date   COLONOSCOPY     COLONOSCOPY WITH PROPOFOL N/A 11/02/2016   Procedure: COLONOSCOPY WITH PROPOFOL;  Surgeon: Iva Boop, MD;  Location: [facility];  Service: Endoscopy;  Laterality: N/A;   COLONOSCOPY WITH PROPOFOL N/A 05/18/2020   Procedure: COLONOSCOPY WITH PROPOFOL;  Surgeon: Iva Boop, MD;  Location: [facility];  Service: Endoscopy;  Laterality: N/A;   LEG SURGERY Right 1997   accident related   XI ROBOTIC ASSISTED SIMPLE PROSTATECTOMY N/A 10/05/2017   Procedure: XI ROBOTIC ASSISTED SIMPLE PROSTATECTOMY WITH UMBILICAL HERNIA REPAIR;  Surgeon: Malen Gauze, MD;  Location: [facility];  Service: Urology;  Laterality: N/A;    SOCIAL HISTORY: Social History   Socioeconomic History   Marital status: Married    Spouse name: [name]   Number of children: 4   Years of education: Not on file   Highest education level: Not on file  Occupational History   Not on file  Tobacco Use   Smoking status: Never   Smokeless tobacco: Never  Vaping Use   Vaping status: Never Used  Substance and Sexual Activity   Alcohol use: No    Alcohol/week: 0.0 standard drinks of alcohol   Drug use: No   Sexual activity: Yes  Other Topics Concern   Not on file  Social History Narrative   Married. 4 children (1 boy, 3 girls). 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 Prior [facility]. Also did this in [place].       Hobbies: enjoys reading novels. Enjoys African authors   Goes to [employer] of [address] ([place])   Social Determinants of Health   Financial Resource Strain: Low Risk  (04/12/2023)   Overall Financial Resource Strain (CARDIA)    Difficulty of Paying Living Expenses: Not hard at all  Food Insecurity: No Food Insecurity (04/12/2023)   Hunger Vital Sign    Worried About Running Out of  Food in the Last Year: Never true    Ran Out of Food in the Last Year: Never true  Transportation Needs: No Transportation Needs (04/12/2023)   PRAPARE - Administrator, Civil Service (Medical): No    Lack of Transportation (Non-Medical): No  Physical Activity: Inactive (04/12/2023)   Exercise Vital Sign    Days of Exercise per Week: 0 days    Minutes of Exercise per Session: 0 min  Stress: No Stress Concern Present (04/12/2023)   [employer] of Occupational Health - Occupational Stress Questionnaire    Feeling of Stress : Not at all  Social Connections: Socially Integrated (04/12/2023)   Social Connection and Isolation Panel [NHANES]    Frequency of Communication with Friends and Family: More than three times a week    Frequency of Social Gatherings with Friends and Family: More than three times a week  Attends Religious Services: More than 4 times per year    Active Member of Clubs or Organizations: Yes    Attends Banker Meetings: 1 to 4 times per year    Marital Status: Married  Catering manager Violence: Not At Risk (04/12/2023)   Humiliation, Afraid, Rape, and Kick questionnaire    Fear of Current or Ex-Partner: No    Emotionally Abused: No    Physically Abused: No    Sexually Abused: No    FAMILY HISTORY: Family History  Problem Relation Age of Onset   Other Father        and mother- states died of old age. States no medical problems in parents or siblings   Colon cancer Neg Hx    Colon polyps Neg Hx    Esophageal cancer Neg Hx    Rectal cancer Neg Hx    Stomach cancer Neg Hx     ALLERGIES:  He has No Known Allergies.  MEDICATIONS:  Current Outpatient Medications  Medication Sig Dispense Refill   Accu-Chek Softclix Lancets lancets 4 (four) times daily.     acetaminophen (TYLENOL) 500 MG tablet Take 1,000 mg by mouth every 6 (six) hours as needed for moderate pain.      allopurinol (ZYLOPRIM) 100 MG tablet Take 1 tablet (100 mg total)  by mouth daily. 90 tablet 3   amLODipine (NORVASC) 5 MG tablet Take 1 tablet (5 mg total) by mouth daily. 90 tablet 3   atorvastatin (LIPITOR) 10 MG tablet Take 1 tablet (10 mg total) by mouth 3 (three) times a week. 40 tablet 3   colchicine 0.6 MG tablet TAKE 2 TABLETS BY MOUTH AT THE FIRST SIGN OF GOUT THEN TAKE 1 TABLET 2 HOURS LATER UNTIL FLARE IS RESOLVED 60 tablet 0   dorzolamide-timolol (COSOPT) 2-0.5 % ophthalmic solution Place 1 drop into both eyes 2 (two) times daily.     empagliflozin (JARDIANCE) 10 MG TABS tablet Take 1 tablet (10 mg total) by mouth daily before breakfast. 90 tablet 3   feeding supplement, ENSURE ENLIVE, (ENSURE ENLIVE) LIQD Take 1 Bottle by mouth daily.     gabapentin (NEURONTIN) 300 MG capsule TAKE 1 CAPSULE BY MOUTH IN THE MORNING AND AT BEDTIME 60 capsule 0   glipiZIDE (GLUCOTROL) 5 MG tablet TAKE 1 TABLET BY MOUTH TWICE DAILY BEFORE MEAL(S) 180 tablet 3   glucose blood (ACCU-CHEK GUIDE) test strip Use to test blood sugars 4 times daily. Dx: E11.9 100 each 0   latanoprost (XALATAN) 0.005 % ophthalmic solution Place 1 drop into both eyes at bedtime.      lidocaine (LIDODERM) 5 % Place 1 patch onto the skin daily. Remove & Discard patch within 12 hours or as directed by MD 30 patch 0   lisinopril (ZESTRIL) 20 MG tablet Take 1 tablet (20 mg total) by mouth daily. 90 tablet 3   metFORMIN (GLUCOPHAGE) 1000 MG tablet Take 1 tablet (1,000 mg total) by mouth 2 (two) times daily with a meal. 180 tablet 3   Multiple Vitamin (MULTIVITAMIN ADULT PO) Take by mouth.     Polyethyl Glycol-Propyl Glycol (SYSTANE) 0.4-0.3 % SOLN Apply to eye. For dry eyes 3 x a day     Tetrahyd-Glyc-Hypro-PEG-ZnSulf (VISINE TOTALITY MULTI-SYMPTOM OP) Apply 1 drop to eye in the morning, at noon, and at bedtime.     No current facility-administered medications for this visit.    REVIEW OF SYSTEMS:    Review of Systems - Oncology  All other systems pertinent  were reviewed with the patient and are  negative, except as mentioned above.  PHYSICAL EXAMINATION:  ECOG PERFORMANCE STATUS: 1 - Symptomatic but completely ambulatory  Vitals:   10/05/23 1110  BP: (!) 136/92  Pulse: 72  Resp: 14  Temp: 97.6 F (36.4 C)  SpO2: 97%   Filed Weights   10/05/23 1110  Weight: 155 lb 14.4 oz (70.7 kg)    Physical Exam Constitutional:      General: He is not in acute distress.    Appearance: Normal appearance.  HENT:     Head: Normocephalic and atraumatic.  Eyes:     General: No scleral icterus.    Conjunctiva/sclera: Conjunctivae normal.  Cardiovascular:     Rate and Rhythm: Normal rate and regular rhythm.     Heart sounds: Normal heart sounds.  Pulmonary:     Effort: Pulmonary effort is normal.     Breath sounds: Normal breath sounds.  Abdominal:     General: There is no distension.  Musculoskeletal:     Right lower leg: No edema.     Left lower leg: No edema.  Neurological:     General: No focal deficit present.     Mental Status: He is alert and oriented to person, place, and time.  Psychiatric:        Mood and Affect: Mood normal.        Behavior: Behavior normal.        Thought Content: Thought content normal.      LABORATORY DATA:   I have reviewed the data as listed.  Results for orders placed or performed in visit on 10/05/23  Vitamin B12  Result Value Ref Range   Vitamin B-12 834 180 - 914 pg/mL  Lactate dehydrogenase  Result Value Ref Range   LDH 170 98 - 192 U/L  Comprehensive metabolic panel  Result Value Ref Range   Sodium 139 135 - 145 mmol/L   Potassium 4.4 3.5 - 5.1 mmol/L   Chloride 104 98 - 111 mmol/L   CO2 29 22 - 32 mmol/L   Glucose, Bld 144 (H) 70 - 99 mg/dL   BUN 23 8 - 23 mg/dL   Creatinine, Ser 8.75 0.61 - 1.24 mg/dL   Calcium 64.3 8.9 - 32.9 mg/dL   Total Protein 7.3 6.5 - 8.1 g/dL   Albumin 4.7 3.5 - 5.0 g/dL   AST 33 15 - 41 U/L   ALT 58 (H) 0 - 44 U/L   Alkaline Phosphatase 65 38 - 126 U/L   Total Bilirubin 0.7 <1.2 mg/dL    GFR, Estimated >51 >88 mL/min   Anion gap 6 5 - 15  CBC with Differential/Platelet  Result Value Ref Range   WBC 4.8 4.0 - 10.5 K/uL   RBC 5.76 4.22 - 5.81 MIL/uL   Hemoglobin 15.3 13.0 - 17.0 g/dL   HCT 41.6 60.6 - 30.1 %   MCV 81.8 80.0 - 100.0 fL   MCH 26.6 26.0 - 34.0 pg   MCHC 32.5 30.0 - 36.0 g/dL   RDW 60.1 09.3 - 23.5 %   Platelets 114 (L) 150 - 400 K/uL   nRBC 0.0 0.0 - 0.2 %   Neutrophils Relative % 43 %   Neutro Abs 2.0 1.7 - 7.7 K/uL   Lymphocytes Relative 42 %   Lymphs Abs 2.1 0.7 - 4.0 K/uL   Monocytes Relative 8 %   Monocytes Absolute 0.4 0.1 - 1.0 K/uL   Eosinophils Relative 6 %  Eosinophils Absolute 0.3 0.0 - 0.5 K/uL   Basophils Relative 1 %   Basophils Absolute 0.0 0.0 - 0.1 K/uL   Immature Granulocytes 0 %   Abs Immature Granulocytes 0.01 0.00 - 0.07 K/uL     RADIOGRAPHIC STUDIES:  No pertinent imaging studies available to review.  ASSESSMENT & PLAN:   Thrombocytopenia (HCC) Long-standing, stable mild thrombocytopenia and leukopenia. No concerning findings on extensive workup including hematologic, rheumatological and infectious disease testing. No evidence of immature cells or blasts in the blood. No symptoms of bleeding or bruising.  -Labs today showed stable platelet count of 114,000.  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  However no hematological intervention would be warranted at current platelet levels.  -Continue monitoring with blood work every 6 months.  Neutropenia (HCC) - Fluctuating neutropenia/leukopenia.  However lately his white count has been within normal limits.  It is essentially normal at 4800 today.  He is asymptomatic.  -He has had extensive workup for thrombocytopenia and leukopenia as mentioned above. 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  Medications including allopurinol and colchicine could be contributing. No additional intervention is warranted at current levels.  Dry mouth not  due to sicca syndrome Reports of dry mouth. No evidence of Sjogren's syndrome on previous testing. -Continue use of hard candy/lozenges to stimulate salivary glands. -Use non-alcoholic mouthwash, especially at night.  Proposed spacing out clinic intervals to annual visits but patient would prefer to keep appointments every 6 months.  Orders Placed This Encounter  Procedures   CBC with Differential/Platelet    Standing Status:   Future    Standing Expiration Date:   10/04/2024    I reviewed lab results and outside records for this visit and discussed relevant results with the patient. Diagnosis, plan of care and treatment options were also discussed in detail with the patient. Opportunity provided to ask questions and answers provided to his apparent satisfaction. Provided instructions to call our clinic with any problems, questions or concerns prior to return visit. I recommended to continue follow-up with PCP and sub-specialists. He verbalized understanding and agreed with the plan.    I spent a total of 30 minutes during this encounter with the patient including review of chart and various tests results, discussions about plan of care and coordination of care plan.  Future Appointments  Date Time Provider Department Center  11/16/2023 10:20 AM [name], MD [facility]  04/04/2024 10:30 AM [facility] LAB [facility] None  04/04/2024 11:00 AM Theodosia Bahena, Archie Patten, MD [facility] None  04/17/2024  8:00 AM [facility] ANNUAL WELLNESS VISIT 1 [facility]     Meryl Crutch, MD  10/05/2023 2:35 PM  [facility] - A DEPT OF [facility] [address] [place] [address] Dept: [phone] Dept Fax: [phone]    This document was completed utilizing speech recognition software. Grammatical errors, random word insertions, pronoun errors, and incomplete sentences are an occasional consequence of this system due to software  limitations, ambient noise, and hardware issues. Any formal questions or concerns about the content, text or information contained within the body of this dictation should be directly addressed to the provider for clarification.

## 2023-10-05 NOTE — Assessment & Plan Note (Signed)
Reports of dry mouth. No evidence of Sjogren's syndrome on previous testing. -Continue use of hard candy/lozenges to stimulate salivary glands. -Use non-alcoholic mouthwash, especially at night.

## 2023-10-05 NOTE — Assessment & Plan Note (Addendum)
-   Fluctuating neutropenia/leukopenia.  However lately his white count has been within normal limits.  It is essentially normal at 4800 today.  He is asymptomatic.  -He has had extensive workup for thrombocytopenia and leukopenia as mentioned above. 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  Medications including allopurinol and colchicine could be contributing. No additional intervention is warranted at current levels.

## 2023-10-08 LAB — METHYLMALONIC ACID, SERUM: Methylmalonic Acid, Quantitative: 127 nmol/L (ref 0–378)

## 2023-10-11 DIAGNOSIS — H524 Presbyopia: Secondary | ICD-10-CM | POA: Diagnosis not present

## 2023-10-11 DIAGNOSIS — H401131 Primary open-angle glaucoma, bilateral, mild stage: Secondary | ICD-10-CM | POA: Diagnosis not present

## 2023-10-11 DIAGNOSIS — E119 Type 2 diabetes mellitus without complications: Secondary | ICD-10-CM | POA: Diagnosis not present

## 2023-10-11 LAB — HM DIABETES EYE EXAM

## 2023-10-15 DIAGNOSIS — H401121 Primary open-angle glaucoma, left eye, mild stage: Secondary | ICD-10-CM | POA: Diagnosis not present

## 2023-10-26 ENCOUNTER — Other Ambulatory Visit: Payer: Self-pay | Admitting: Family Medicine

## 2023-10-29 DIAGNOSIS — H401112 Primary open-angle glaucoma, right eye, moderate stage: Secondary | ICD-10-CM | POA: Diagnosis not present

## 2023-10-30 ENCOUNTER — Telehealth: Payer: Self-pay | Admitting: Family Medicine

## 2023-10-30 NOTE — Telephone Encounter (Signed)
Called patient's pharmacy to confirm if patient has refill from 10/01/23 and pharmacy is closed for lunch at time of call and due to reopen at 2 PM and our office will be closed at that time.

## 2023-10-30 NOTE — Telephone Encounter (Signed)
 On 10/27/23, pt called requesting gabapentin  refill. I called pt and informed him that gabapentin  refill was sent on 10/01/23 containing 60 tablets. I informed him of which pharmacy it was sent to and advised he call them to inquire about pick up. Pt verbalized understanding.    Patient Name First: Preston Last: Pennington Gender: Male DOB: 1951-07-24 Age: 72 Y 3 M Return Phone Number: [phone] (Primary) Address: City/ State/ Zip: [place]  [address] Client [facility] - Human Resources [facility] [employer] Provider Katrinka Senior- MD Contact Type Call Who Is Calling Patient / Member / Family / Caregiver Call Type Triage / Clinical Relationship To Patient Self Return Phone Number [phone] (Primary) Chief Complaint Prescription Refill or Medication Request (non symptomatic) Reason for Call Medication Question / Request Initial Comment Caller states she contacted his pharmacy about a refill for Gabapentin . Caller states they advised him to call the doctor for a refill. Translation No Disp. Time Titus Time) Disposition Final User 10/27/2023 10:30:20 AM Send To Nurse Acey Pontes, RN, Cherene 10/27/2023 8:25:43 PM FINAL ATTEMPT MADE - no message left Yes Verneda OBIE Craven 10/27/2023 8:25:48 PM Send to RN Final Attempt Vallery Verneda, RN, Craven Final Disposition 10/27/2023 8:25:43 PM FINAL ATTEMPT MADE - no message left Yes Verneda, RN, Craven

## 2023-11-01 ENCOUNTER — Other Ambulatory Visit: Payer: Self-pay | Admitting: Family Medicine

## 2023-11-08 ENCOUNTER — Ambulatory Visit (INDEPENDENT_AMBULATORY_CARE_PROVIDER_SITE_OTHER): Payer: Medicare Other | Admitting: Family

## 2023-11-08 ENCOUNTER — Encounter: Payer: Self-pay | Admitting: Family

## 2023-11-08 VITALS — BP 122/76 | HR 93 | Temp 97.6°F | Ht 67.0 in | Wt 155.2 lb

## 2023-11-08 DIAGNOSIS — R058 Other specified cough: Secondary | ICD-10-CM

## 2023-11-08 DIAGNOSIS — J3489 Other specified disorders of nose and nasal sinuses: Secondary | ICD-10-CM | POA: Diagnosis not present

## 2023-11-08 LAB — POCT INFLUENZA A/B
Influenza A, POC: NEGATIVE
Influenza B, POC: NEGATIVE

## 2023-11-08 LAB — POC COVID19 BINAXNOW: SARS Coronavirus 2 Ag: NEGATIVE

## 2023-11-08 MED ORDER — AZELASTINE HCL 0.1 % NA SOLN: 2.0000 | Freq: Two times a day (BID) | NASAL | 12 refills | Status: AC

## 2023-11-08 MED ORDER — BENZONATATE 100 MG PO CAPS: 100.0000 mg | ORAL_CAPSULE | Freq: Two times a day (BID) | ORAL | 0 refills | Status: DC | PRN

## 2023-11-08 NOTE — Progress Notes (Signed)
 Patient ID: Preston Pennington, male    DOB: Sep 13, 1951, 73 y.o.   MRN: [id number]  Chief Complaint  Patient presents with   Sinus Problem    Pt c/o dry cough and runny nose, Present for 3 days.    Discussed the use of AI scribe software for clinical note transcription with the patient, who gave verbal consent to proceed.  History of Present Illness   Preston Pennington, a research scientist (physical sciences), presents with a dry cough and runny nose. He has been wearing a mask at work and received a flu shot this year. He has not tried any over-the-counter medications for his symptoms, except for cough drops. He denies any mucus production or fever. He also reports a dry mouth, which he has been managing by sucking on hard candy as advised by another doctor. He has not taken any antihistamines for allergies. Additionally, he reports a muscle pull in his left upper arm a few days ago, which is causing him discomfort.     Assessment & Plan:     Upper Respiratory Symptoms - Dry cough, rhinorrhea, no fever, negative for flu and COVID-19. No signs of lower respiratory involvement on examination. -Prescribe Tessalon  Perles for cough. -Prescribe nasal spray to alleviate rhinorrhea.  Dry Mouth - Reports dry mouth and using hard candy to stimulate saliva production. -Continue current management with hard candy. -Can also try OTC Biotene mouth lozenges or spray if sx persist.  Muscle Strain - Reports pulling a muscle in his left upper arm a few days ago, tender on palpation. -Advise use of a heating pad (or OTC heating patches) up to tid to increase circulation and promote healing.     Subjective:    Outpatient Medications Prior to Visit  Medication Sig Dispense Refill   Accu-Chek Softclix Lancets lancets 4 (four) times daily.     acetaminophen  (TYLENOL ) 500 MG tablet Take 1,000 mg by mouth every 6 (six) hours as needed for moderate pain.      allopurinol  (ZYLOPRIM ) 100 MG tablet Take 1 tablet (100 mg total) by mouth  daily. 90 tablet 3   amLODipine  (NORVASC ) 5 MG tablet Take 1 tablet (5 mg total) by mouth daily. 90 tablet 3   atorvastatin  (LIPITOR) 10 MG tablet TAKE 1 TABLET BY MOUTH THREE TIMES A WEEK 40 tablet 0   colchicine  0.6 MG tablet TAKE 2 TABLETS BY MOUTH AT THE FIRST SIGN OF GOUT THEN TAKE 1 TABLET 2 HOURS LATER UNTIL FLARE IS RESOLVED 60 tablet 0   dorzolamide-timolol (COSOPT) 2-0.5 % ophthalmic solution Place 1 drop into both eyes 2 (two) times daily.     empagliflozin  (JARDIANCE ) 10 MG TABS tablet Take 1 tablet (10 mg total) by mouth daily before breakfast. 90 tablet 3   feeding supplement, ENSURE ENLIVE, (ENSURE ENLIVE) LIQD Take 1 Bottle by mouth daily.     gabapentin  (NEURONTIN ) 300 MG capsule TAKE 1 CAPSULE BY MOUTH IN THE MORNING AND AT BEDTIME 60 capsule 0   glipiZIDE  (GLUCOTROL ) 5 MG tablet TAKE 1 TABLET BY MOUTH TWICE DAILY BEFORE MEAL(S) 180 tablet 0   glucose blood (ACCU-CHEK GUIDE) test strip Use to test blood sugars 4 times daily. Dx: E11.9 100 each 0   latanoprost (XALATAN) 0.005 % ophthalmic solution Place 1 drop into both eyes at bedtime.      lidocaine  (LIDODERM ) 5 % Place 1 patch onto the skin daily. Remove & Discard patch within 12 hours or as directed by MD 30 patch 0   lisinopril  (  ZESTRIL ) 20 MG tablet Take 1 tablet (20 mg total) by mouth daily. 90 tablet 3   metFORMIN  (GLUCOPHAGE ) 1000 MG tablet Take 1 tablet (1,000 mg total) by mouth 2 (two) times daily with a meal. 180 tablet 3   Multiple Vitamin (MULTIVITAMIN ADULT PO) Take by mouth.     Polyethyl Glycol-Propyl Glycol (SYSTANE) 0.4-0.3 % SOLN Apply to eye. For dry eyes 3 x a day     Tetrahyd-Glyc-Hypro-PEG-ZnSulf (VISINE TOTALITY MULTI-SYMPTOM OP) Apply 1 drop to eye in the morning, at noon, and at bedtime.     No facility-administered medications prior to visit.   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   Diabetes mellitus without complication (HCC)     Difficult intubation    mouth was not wide enough   Dry eyes    History of adenomatous polyp of colon 11/06/2016   10/2016 adenoma x3 - recall colon 10/2019   Hyperlipidemia    Hypertension    Neuromuscular disorder (HCC)    past hx tingling left arm    Prostate enlargement 2010   See's Urologist    Past Surgical History:  Procedure Laterality Date   COLONOSCOPY     COLONOSCOPY WITH PROPOFOL  N/A 11/02/2016   Procedure: COLONOSCOPY WITH PROPOFOL ;  Surgeon: Lupita FORBES Commander, MD;  Location: [facility];  Service: Endoscopy;  Laterality: N/A;   COLONOSCOPY WITH PROPOFOL  N/A 05/18/2020   Procedure: COLONOSCOPY WITH PROPOFOL ;  Surgeon: Commander Lupita FORBES, MD;  Location: [facility];  Service: Endoscopy;  Laterality: N/A;   LEG SURGERY Right 1997   accident related   XI ROBOTIC ASSISTED SIMPLE PROSTATECTOMY N/A 10/05/2017   Procedure: XI ROBOTIC ASSISTED SIMPLE PROSTATECTOMY WITH UMBILICAL HERNIA REPAIR;  Surgeon: Sherrilee Belvie CROME, MD;  Location: [facility];  Service: Urology;  Laterality: N/A;   No Known Allergies    Objective:    Physical Exam Vitals and nursing note reviewed.  Constitutional:      General: He is not in acute distress.    Appearance: Normal appearance. He is not ill-appearing.  HENT:     Head: Normocephalic.     Right Ear: Tympanic membrane and ear canal normal.     Left Ear: Tympanic membrane and ear canal normal.     Nose:     Right Sinus: No maxillary sinus tenderness or frontal sinus tenderness.     Left Sinus: No maxillary sinus tenderness or frontal sinus tenderness.     Mouth/Throat:     Mouth: Mucous membranes are moist.     Pharynx: No pharyngeal swelling, oropharyngeal exudate, posterior oropharyngeal erythema or uvula swelling.     Tonsils: No tonsillar exudate or tonsillar abscesses.  Cardiovascular:     Rate and Rhythm: Normal rate and regular rhythm.  Pulmonary:     Effort: Pulmonary effort is normal.     Breath sounds: Normal breath sounds.   Musculoskeletal:        General: Normal range of motion.     Cervical back: Normal range of motion.  Lymphadenopathy:     Head:     Right side of head: No preauricular or posterior auricular adenopathy.     Left side of head: No preauricular or posterior auricular adenopathy.     Cervical: No cervical adenopathy.  Skin:    General: Skin is warm and dry.  Neurological:     Mental Status: He is alert and oriented to person, place, and time.  Psychiatric:  Mood and Affect: Mood normal.    BP 122/76 (BP Location: Left Arm, Patient Position: Sitting, Cuff Size: Normal)   Pulse 93   Temp 97.6 F (36.4 C) (Temporal)   Ht 5' 7 (1.702 m)   Wt 155 lb 3.2 oz (70.4 kg)   SpO2 97%   BMI 24.31 kg/m  Wt Readings from Last 3 Encounters:  11/08/23 155 lb 3.2 oz (70.4 kg)  10/05/23 155 lb 14.4 oz (70.7 kg)  05/31/23 158 lb (71.7 kg)      Lucius Krabbe, NP

## 2023-11-09 ENCOUNTER — Other Ambulatory Visit: Payer: Self-pay | Admitting: Family Medicine

## 2023-11-16 ENCOUNTER — Ambulatory Visit (INDEPENDENT_AMBULATORY_CARE_PROVIDER_SITE_OTHER): Payer: Medicare Other | Admitting: Family Medicine

## 2023-11-16 ENCOUNTER — Encounter: Payer: Self-pay | Admitting: Family Medicine

## 2023-11-16 VITALS — BP 100/60 | HR 78 | Temp 97.0°F | Ht 67.0 in | Wt 154.0 lb

## 2023-11-16 DIAGNOSIS — E119 Type 2 diabetes mellitus without complications: Secondary | ICD-10-CM

## 2023-11-16 DIAGNOSIS — Z0000 Encounter for general adult medical examination without abnormal findings: Secondary | ICD-10-CM

## 2023-11-16 DIAGNOSIS — E785 Hyperlipidemia, unspecified: Secondary | ICD-10-CM

## 2023-11-16 DIAGNOSIS — E1169 Type 2 diabetes mellitus with other specified complication: Secondary | ICD-10-CM | POA: Diagnosis not present

## 2023-11-16 DIAGNOSIS — Z125 Encounter for screening for malignant neoplasm of prostate: Secondary | ICD-10-CM | POA: Diagnosis not present

## 2023-11-16 DIAGNOSIS — Z7984 Long term (current) use of oral hypoglycemic drugs: Secondary | ICD-10-CM | POA: Diagnosis not present

## 2023-11-16 LAB — CBC WITH DIFFERENTIAL/PLATELET
Basophils Absolute: 0 10*3/uL (ref 0.0–0.1)
Basophils Relative: 0.4 % (ref 0.0–3.0)
Eosinophils Absolute: 0.2 10*3/uL (ref 0.0–0.7)
Eosinophils Relative: 4.2 % (ref 0.0–5.0)
HCT: 44.2 % (ref 39.0–52.0)
Hemoglobin: 14 g/dL (ref 13.0–17.0)
Lymphocytes Relative: 40.5 % (ref 12.0–46.0)
Lymphs Abs: 1.7 10*3/uL (ref 0.7–4.0)
MCHC: 31.6 g/dL (ref 30.0–36.0)
MCV: 82.8 fL (ref 78.0–100.0)
Monocytes Absolute: 0.3 10*3/uL (ref 0.1–1.0)
Monocytes Relative: 7.2 % (ref 3.0–12.0)
Neutro Abs: 2 10*3/uL (ref 1.4–7.7)
Neutrophils Relative %: 47.7 % (ref 43.0–77.0)
Platelets: 122 10*3/uL — ABNORMAL LOW (ref 150.0–400.0)
RBC: 5.34 Mil/uL (ref 4.22–5.81)
RDW: 13.9 % (ref 11.5–15.5)
WBC: 4.1 10*3/uL (ref 4.0–10.5)

## 2023-11-16 LAB — LIPID PANEL
Cholesterol: 137 mg/dL (ref 0–200)
HDL: 34.1 mg/dL — ABNORMAL LOW (ref >39.00)
LDL Cholesterol: 84 mg/dL (ref 0–99)
NonHDL: 103.05
Total CHOL/HDL Ratio: 4
Triglycerides: 97 mg/dL (ref 0.0–149.0)
VLDL: 19.4 mg/dL (ref 0.0–40.0)

## 2023-11-16 LAB — COMPREHENSIVE METABOLIC PANEL
ALT: 80 U/L — ABNORMAL HIGH (ref 0–53)
AST: 51 U/L — ABNORMAL HIGH (ref 0–37)
Albumin: 4.3 g/dL (ref 3.5–5.2)
Alkaline Phosphatase: 69 U/L (ref 39–117)
BUN: 24 mg/dL — ABNORMAL HIGH (ref 6–23)
CO2: 28 meq/L (ref 19–32)
Calcium: 9.8 mg/dL (ref 8.4–10.5)
Chloride: 106 meq/L (ref 96–112)
Creatinine, Ser: 0.92 mg/dL (ref 0.40–1.50)
GFR: 82.64 mL/min (ref >60.00)
Glucose, Bld: 84 mg/dL (ref 70–99)
Potassium: 4.1 meq/L (ref 3.5–5.1)
Sodium: 142 meq/L (ref 135–145)
Total Bilirubin: 0.5 mg/dL (ref 0.2–1.2)
Total Protein: 6.8 g/dL (ref 6.0–8.3)

## 2023-11-16 LAB — HEMOGLOBIN A1C: Hgb A1c MFr Bld: 6.6 % — ABNORMAL HIGH (ref 4.6–6.5)

## 2023-11-16 LAB — MICROALBUMIN / CREATININE URINE RATIO
Creatinine,U: 64.2 mg/dL
Microalb Creat Ratio: 1.1 mg/g (ref 0.0–30.0)
Microalb, Ur: <0.7 mg/dL (ref 0.0–1.9)

## 2023-11-16 LAB — PSA, MEDICARE: PSA: 1.89 ng/mL (ref 0.10–4.00)

## 2023-11-16 NOTE — Patient Instructions (Addendum)
Please stop by lab before you go If you have mychart- we will send your results within 3 business days of [place] receiving them.  If you do not have mychart- we will call you about results within 5 business days of [place] receiving them.  *please also note that you will see labs on mychart as soon as they post. I will later go in and write notes on them- will say "notes from Dr. Durene Cal"   Recommended follow up: Return in about 6 months (around 05/15/2024) for followup or sooner if needed.Schedule b4 you leave.

## 2023-11-16 NOTE — Progress Notes (Signed)
Phone: [phone]   Subjective:  Patient presents today for their annual physical. Chief complaint-noted.   See problem oriented charting- ROS- full  review of systems was completed and negative  except for: lingering cough/congestion- started around January 6th- seen by [facility] on 11/08/23 and lflu and COVID negative. 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   Diabetes mellitus without complication (HCC)    Difficult intubation    "mouth was not wide enough"   Dry eyes    History of adenomatous polyp of colon 11/06/2016   10/2016 adenoma x3 - recall colon 10/2019   Hyperlipidemia    Hypertension    Neuromuscular disorder (HCC)    past hx tingling left arm    Prostate enlargement 2010   See's Urologist    Patient Active Problem List   Diagnosis Date Noted   Diabetes mellitus type II, controlled (HCC) 04/09/2013    Priority: High   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 03/31/2019    Priority: Medium    Hyperlipidemia associated with type 2 diabetes mellitus (HCC) 07/09/2018    Priority: Medium    Left cervical radiculopathy 09/24/2017    Priority: Medium    Hx of colonic polyps 11/06/2016    Priority: Medium    Chronic ankle pain 06/14/2016    Priority: Medium    Thrombocytopenia (HCC) 11/29/2015    Priority: Medium    Gout 04/30/2015    Priority: Medium    Essential hypertension 04/09/2013    Priority: Medium    BPH (benign prostatic hyperplasia) 04/09/2013    Priority: Medium    Leg length difference, acquired 06/20/2019    Priority: Low   Arthritis of first metatarsophalangeal (MTP) joint of right foot 05/30/2019    Priority: Low   Sesamoiditis 11/08/2018    Priority: Low   Osteoarthritis of spine with radiculopathy, cervical region 11/14/2017    Priority: Low   Erectile dysfunction 02/04/2015    Priority: Low    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 10/05/2023   Steatosis of liver 03/30/2023   History of malaria 01/15/2023   Colchicine adverse reaction 12/18/2022   Neutropenia (HCC) 12/08/2022   Dupuytren's contracture of right hand 06/13/2021   Past Surgical History:  Procedure Laterality Date   COLONOSCOPY     COLONOSCOPY WITH PROPOFOL N/A 11/02/2016   Procedure: COLONOSCOPY WITH PROPOFOL;  Surgeon: Iva Boop, MD;  Location: [facility];  Service: Endoscopy;  Laterality: N/A;   COLONOSCOPY WITH PROPOFOL N/A 05/18/2020   Procedure: COLONOSCOPY WITH PROPOFOL;  Surgeon: Iva Boop, MD;  Location: [facility];  Service: Endoscopy;  Laterality: N/A;   LEG SURGERY Right 1997   accident related   XI ROBOTIC ASSISTED SIMPLE PROSTATECTOMY N/A 10/05/2017   Procedure: XI ROBOTIC ASSISTED SIMPLE PROSTATECTOMY WITH UMBILICAL HERNIA REPAIR;  Surgeon: Malen Gauze, MD;  Location: [facility];  Service: Urology;  Laterality: N/A;    Family History  Problem Relation Age of Onset   Other Father        and mother- states died of old age. States no medical problems in parents or siblings   Colon cancer Neg Hx    Colon polyps Neg Hx    Esophageal cancer Neg Hx    Rectal cancer Neg Hx    Stomach cancer Neg Hx     Medications- reviewed and updated Current Outpatient Medications  Medication  Sig Dispense Refill   Accu-Chek Softclix Lancets lancets 4 (four) times daily.     acetaminophen (TYLENOL) 500 MG tablet Take 1,000 mg by mouth every 6 (six) hours as needed for moderate pain.      allopurinol (ZYLOPRIM) 100 MG tablet Take 1 tablet (100 mg total) by mouth daily. 90 tablet 3   amLODipine (NORVASC) 5 MG tablet Take 1 tablet (5 mg total) by mouth daily. 90 tablet 3   atorvastatin (LIPITOR) 10 MG tablet TAKE 1 TABLET BY MOUTH THREE TIMES A WEEK 40 tablet 0   azelastine (ASTELIN) 0.1 % nasal spray Place 2 sprays into both nostrils 2 (two) times daily. For runny nose. 30 mL 12   colchicine 0.6 MG  tablet TAKE 2 TABLETS BY MOUTH AT THE FIRST SIGN OF GOUT THEN TAKE 1 TABLET 2 HOURS LATER UNTIL FLARE IS RESOLVED 60 tablet 0   dorzolamide-timolol (COSOPT) 2-0.5 % ophthalmic solution Place 1 drop into both eyes 2 (two) times daily.     empagliflozin (JARDIANCE) 10 MG TABS tablet Take 1 tablet (10 mg total) by mouth daily before breakfast. 90 tablet 3   feeding supplement, ENSURE ENLIVE, (ENSURE ENLIVE) LIQD Take 1 Bottle by mouth daily.     gabapentin (NEURONTIN) 300 MG capsule TAKE 1 CAPSULE BY MOUTH IN THE MORNING AND AT BEDTIME 60 capsule 0   glipiZIDE (GLUCOTROL) 5 MG tablet TAKE 1 TABLET BY MOUTH TWICE DAILY BEFORE MEAL(S) 180 tablet 0   glucose blood (ACCU-CHEK GUIDE) test strip Use to test blood sugars 4 times daily. Dx: E11.9 100 each 0   latanoprost (XALATAN) 0.005 % ophthalmic solution Place 1 drop into both eyes at bedtime.      lidocaine (LIDODERM) 5 % Place 1 patch onto the skin daily. Remove & Discard patch within 12 hours or as directed by MD 30 patch 0   lisinopril (ZESTRIL) 20 MG tablet Take 1 tablet (20 mg total) by mouth daily. 90 tablet 3   metFORMIN (GLUCOPHAGE) 1000 MG tablet Take 1 tablet (1,000 mg total) by mouth 2 (two) times daily with a meal. 180 tablet 3   Multiple Vitamin (MULTIVITAMIN ADULT PO) Take by mouth.     Polyethyl Glycol-Propyl Glycol (SYSTANE) 0.4-0.3 % SOLN Apply to eye. For dry eyes 3 x a day     Tetrahyd-Glyc-Hypro-PEG-ZnSulf (VISINE TOTALITY MULTI-SYMPTOM OP) Apply 1 drop to eye in the morning, at noon, and at bedtime.     No current facility-administered medications for this visit.    Allergies-reviewed and updated No Known Allergies  Social History   Social History Narrative   Married. 4 children (1 boy, 3 girls). 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 Prior [facility]. Also did this in  [place].       Hobbies: enjoys reading novels. Enjoys African authors   Goes to [employer] [employer]   Objective  Objective:  BP 100/60   Pulse 78   Temp (!) 97 F (36.1 C)   Ht 5\' 7"  (1.702 m)   Wt 154 lb (69.9 kg)   SpO2 96%   BMI 24.12 kg/m  Gen: NAD, resting comfortably HEENT: Mucous membranes are moist. Oropharynx normal Neck: no thyromegaly CV: RRR no murmurs rubs or gallops Lungs: CTAB no crackles, wheeze, rhonchi Abdomen: soft/nontender/nondistended/normal bowel sounds. No  rebound or guarding.  Ext: no edema Skin: warm, dry Neuro: grossly normal, moves all extremities, PERRLA    Assessment and Plan  73 y.o. male presenting for annual physical.  Health Maintenance counseling: 1. Anticipatory guidance: Patient counseled regarding regular dental exams -q6 months, eye exams -yearly,  avoiding smoking and second hand smoke , limiting alcohol to 2 beverages per day , no illicit drugs .   2. Risk factor reduction:  Advised patient of need for regular exercise and diet rich and fruits and vegetables to reduce risk of heart attack and stroke.  Exercise- gets out and walks 10 minutes about 3 days a week- encouraged to increase.  Diet/weight management-reasonably healthy.  Wt Readings from Last 3 Encounters:  11/16/23 154 lb (69.9 kg)  11/08/23 155 lb 3.2 oz (70.4 kg)  10/05/23 155 lb 14.4 oz (70.7 kg)  3. Immunizations/screenings/ancillary studies -Has a mild illness and wants to hold off on the flu shot today. Immunization History  Administered Date(s) Administered   Fluad Quad(high Dose 65+) 07/10/2019, 07/27/2020   Hepatitis B 02/27/2021, 03/30/2021   Influenza Split 07/31/2011   Influenza, High Dose Seasonal PF 09/01/2017   Influenza-Unspecified 07/16/2015, 08/14/2018, 09/14/2021   Moderna Sars-Covid-2 Vaccination 10/27/2019, 11/24/2019, 09/16/2020, 03/17/2021, 07/19/2021   Pfizer Covid-19 Vaccine Bivalent Booster 62yrs & up 06/28/2022    Pneumococcal Conjugate-13 03/29/2017, 02/12/2018   Pneumococcal Polysaccharide-23 07/31/2011, 07/10/2019, 02/24/2020   Tdap 11/29/2015   Zoster Recombinant(Shingrix) 02/12/2018, 05/01/2018  4. Prostate cancer screening-  Low risk overall PSA trend-he would like to check this at least annually.  Prior prostatectomy for BPH. Sees Dr. Ronne Binning at urology in past- prefers to be seen here only.Marland Kitchen Has had biopsy in 2017 reassuring for elevated PSA prior to prostatectomy in 2018 for bph Lab Results  Component Value Date   PSA 1.67 05/17/2023   PSA 1.34 04/11/2022   PSA 1.51 01/26/2021   5. Colon cancer screening - 05/18/20 with 5 year repeat 6. Skin cancer screening- lower risk due to melanin content. advised regular sunscreen use. Denies worrisome, changing, or new skin lesions.  7. Smoking associated screening (lung cancer screening, AAA screen 65-75, UA)- never smoker 8. STD screening - only active with wife    Status of chronic or acute concerns   # Diabetes S: compliant with on Metformin 1000 mg BID and Glipizide 5 mg twice daily and jardiance 10 mg   -gabapentin started for cervical radiculopathy- he prefers to continue as seems to help with leg pain (possible neuropathy) 300 mg twice daily CBGs- most readings in 100-150 range depending on what he eats day before  A/P: hopefully stable- update a1c today. Continue current meds for now   #hypertension S: compliant with Amlodipine 5 mg once daily and lisinopril 20 mg daily.  BP Readings from Last 3 Encounters:  11/16/23 100/60  11/08/23 122/76  10/05/23 (!) 136/92  A/P: running lower than usual but overall average in recent months reassuring- continue to monitor   #hyperlipidemia S: compliant with Atorvastatin 10 mg 3 times weekly.  Lab Results  Component Value Date   CHOL 135 05/17/2023   HDL 36.80 (L) 05/17/2023   LDLCALC 79 05/17/2023   LDLDIRECT 68.0 10/14/2021   TRIG 98.0 05/17/2023   CHOLHDL 4 05/17/2023    A/P: tolerating  medications- LDL close to ideal goal- update today and likely continue current medications   # Gout S: compliant with Allopurinol 100 mg daily and Colchicine 0.6 mg prn.  Rare flares A/P: no recent flares- continue  to monitor    # Thrombocytopenia/leukopenia S:platelets generally around 100. he states has been like this for over 2 years.  Intermittent leukopenia- typically get cbc with differential. Has seen hematology in past "benign ethnic neutropenia, and medication induced (allopurinol and colchicine) Will screen for TB, EBV, CMV, duffy Ab" "Patient does have history of malaria infection which can have asymptomatic parasitemia causing thrombocytopenia but this is doubtful in setting of normal haptoglobin" A/P: will monitor- has been stable lately- didn't even have leukopenia last check- just mildly low platelets   Recommended follow up: Return in about 6 months (around 05/15/2024) for followup or sooner if needed.Schedule b4 you leave. Future Appointments  Date Time Provider Department Center  04/04/2024 10:30 AM [facility] LAB [facility] None  04/04/2024 11:00 AM Pasam, Avinash, MD [facility] None  04/17/2024  8:00 AM [facility] ANNUAL WELLNESS VISIT 1 [facility]    Lab/Order associations:NOT fasting   ICD-10-CM   1. Preventative health care  Z00.00     2. Hyperlipidemia associated with type 2 diabetes mellitus (HCC) Chronic E11.69 Comprehensive metabolic panel   J57.0 CBC with Differential/Platelet    3. Controlled type 2 diabetes mellitus without complication, without long-term current use of insulin (HCC)  E11.9 Urine Microalbumin w/creat. ratio    Comprehensive metabolic panel    CBC with Differential/Platelet    Lipid panel    Hemoglobin A1c    4. Screening for prostate cancer  Z12.5 PSA, Medicare      No orders of the defined types were placed in this encounter.   Return precautions advised.  Tana Conch, MD

## 2023-11-20 ENCOUNTER — Other Ambulatory Visit: Payer: Self-pay

## 2023-11-20 MED ORDER — ATORVASTATIN CALCIUM 10 MG PO TABS: 10.0000 mg | ORAL_TABLET | ORAL | 3 refills | Status: DC

## 2023-11-28 ENCOUNTER — Other Ambulatory Visit: Payer: Self-pay | Admitting: Family Medicine

## 2023-12-02 ENCOUNTER — Other Ambulatory Visit: Payer: Self-pay | Admitting: Family Medicine

## 2023-12-18 ENCOUNTER — Other Ambulatory Visit: Payer: Self-pay | Admitting: Family Medicine

## 2024-01-17 ENCOUNTER — Other Ambulatory Visit: Payer: Self-pay | Admitting: Family Medicine

## 2024-01-18 ENCOUNTER — Telehealth: Payer: Self-pay

## 2024-01-18 NOTE — Telephone Encounter (Signed)
 Before I call [facility], can you look at the instructions to make sure they are correct please?  Copied from CRM [id number]. Topic: Clinical - Medication Question >> Jan 18, 2024  9:47 AM Fredrich Romans wrote: Reason for CRM: [facility] pharmacy called to get clarification on instructions for medication colchicine 0.6 MG tablet  [facility] [address] [place] - [address]  Phone: [phone] Fax: [phone]

## 2024-01-18 NOTE — Telephone Encounter (Signed)
 Reads as "TAKE 2 TABLETS BY MOUTH AT THE FIRST SIGN OF GOUT THEN TAKE 1 TABLET 2 HOURS LATER UNTIL FLARE IS RESOLVED " but his is an error  It should read " TAKE 2 TABLETS BY MOUTH AT THE FIRST SIGN OF GOUT THEN TAKE 1 TABLET 2 HOURS LATER on day 1 (max 3 tablets), then take daily UNTIL FLARE IS RESOLVED "

## 2024-01-21 MED ORDER — COLCHICINE 0.6 MG PO TABS: ORAL_TABLET | ORAL | 3 refills | Status: DC

## 2024-01-21 NOTE — Telephone Encounter (Signed)
 Updated rx sent to pharmacy

## 2024-01-21 NOTE — Addendum Note (Signed)
 Addended by: Gwenette Greet on: 01/21/2024 08:06 AM   Modules accepted: Orders

## 2024-01-22 ENCOUNTER — Ambulatory Visit (INDEPENDENT_AMBULATORY_CARE_PROVIDER_SITE_OTHER): Admitting: Family

## 2024-01-22 ENCOUNTER — Other Ambulatory Visit: Payer: Self-pay | Admitting: Family Medicine

## 2024-01-22 ENCOUNTER — Encounter: Payer: Self-pay | Admitting: Family

## 2024-01-22 VITALS — BP 124/77 | HR 76 | Temp 97.8°F | Ht 67.0 in | Wt 153.1 lb

## 2024-01-22 DIAGNOSIS — J301 Allergic rhinitis due to pollen: Secondary | ICD-10-CM | POA: Diagnosis not present

## 2024-01-22 DIAGNOSIS — J3489 Other specified disorders of nose and nasal sinuses: Secondary | ICD-10-CM | POA: Diagnosis not present

## 2024-01-22 NOTE — Progress Notes (Signed)
 Patient ID: Preston Pennington, male    DOB: 1951-04-22, 73 y.o.   MRN: [id number]  Chief Complaint  Patient presents with   Nasal Congestion    Pt c/o runny nose, present since yesterday.    Headache    Pt c/o headache on left side of head when waking up in the morning.        Discussed the use of AI scribe software for clinical note transcription with the patient, who gave verbal consent to proceed.  History of Present Illness The patient presents with sinus symptoms, including sneezing, a runny nose, and congestion. He reports that these symptoms began yesterday, and he believes he may be related to spring allergies. The patient describes a sensation of blockage in his nasal passages, making it difficult to breathe. He also reports a headache localized to one side, which is most severe in the mornings but tends to subside as the day progresses. He has not taken any over-the-counter medications for these symptoms. The patient has a history of using azelastine nasal spray for similar symptoms in the past and began using it again last night.   Assessment & Plan Allergic Rhinitis - Acute allergic rhinitis due to spring pollen exposure with nasal congestion causing dyspnea. Has not used any medications other than one dose of Astelin nasal spray. - Advised to use the azelastine nasal spray, prescribed previously, two sprays per nostril, twice daily, continue thru allergy season, mid-May. - Prescribing Norel AD (samples provided), one tablet twice daily for three days. Advise nighttime dosing if drowsiness occurs. - Encourage adequate hydration at least 2 liters water daily. -Use OTC saline nasal spray tid for disinfecting. - Instruct to contact office if symptoms persist into next week.   Assessment & Plan:   Subjective:    Outpatient Medications Prior to Visit  Medication Sig Dispense Refill   Accu-Chek Softclix Lancets lancets 4 (four) times daily.     acetaminophen (TYLENOL) 500 MG tablet  Take 1,000 mg by mouth every 6 (six) hours as needed for moderate pain.      allopurinol (ZYLOPRIM) 100 MG tablet Take 1 tablet by mouth once daily 90 tablet 0   amLODipine (NORVASC) 5 MG tablet Take 1 tablet by mouth once daily 90 tablet 0   atorvastatin (LIPITOR) 10 MG tablet Take 1 tablet (10 mg total) by mouth once a week. 13 tablet 3   azelastine (ASTELIN) 0.1 % nasal spray Place 2 sprays into both nostrils 2 (two) times daily. For runny nose. 30 mL 12   colchicine 0.6 MG tablet TAKE 2 TABLETS BY MOUTH AT THE FIRST SIGN OF GOUT THEN TAKE 1 TABLET 2 HOURS LATER on day 1 (max 3 tablets), then take daily UNTIL FLARE IS RESOLVED " 60 tablet 3   dorzolamide-timolol (COSOPT) 2-0.5 % ophthalmic solution Place 1 drop into both eyes 2 (two) times daily.     empagliflozin (JARDIANCE) 10 MG TABS tablet Take 1 tablet (10 mg total) by mouth daily before breakfast. 90 tablet 3   feeding supplement, ENSURE ENLIVE, (ENSURE ENLIVE) LIQD Take 1 Bottle by mouth daily.     gabapentin (NEURONTIN) 300 MG capsule TAKE 1 CAPSULE BY MOUTH IN THE MORNING AND AT BEDTIME 60 capsule 0   glipiZIDE (GLUCOTROL) 5 MG tablet TAKE 1 TABLET BY MOUTH TWICE DAILY BEFORE MEAL(S) 180 tablet 0   glucose blood (ACCU-CHEK GUIDE) test strip Use to test blood sugars 4 times daily. Dx: E11.9 100 each 0   latanoprost (XALATAN)  0.005 % ophthalmic solution Place 1 drop into both eyes at bedtime.      lidocaine (LIDODERM) 5 % Place 1 patch onto the skin daily. Remove & Discard patch within 12 hours or as directed by MD 30 patch 0   lisinopril (ZESTRIL) 20 MG tablet Take 1 tablet by mouth once daily 90 tablet 0   metFORMIN (GLUCOPHAGE) 1000 MG tablet TAKE 1 TABLET BY MOUTH TWICE DAILY WITH MEALS 180 tablet 0   Multiple Vitamin (MULTIVITAMIN ADULT PO) Take by mouth.     Polyethyl Glycol-Propyl Glycol (SYSTANE) 0.4-0.3 % SOLN Apply to eye. For dry eyes 3 x a day     Tetrahyd-Glyc-Hypro-PEG-ZnSulf (VISINE TOTALITY MULTI-SYMPTOM OP) Apply 1 drop  to eye in the morning, at noon, and at bedtime.     No facility-administered medications prior to visit.   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   Diabetes mellitus without complication (HCC)    Difficult intubation    "mouth was not wide enough"   Dry eyes    History of adenomatous polyp of colon 11/06/2016   10/2016 adenoma x3 - recall colon 10/2019   Hyperlipidemia    Hypertension    Neuromuscular disorder (HCC)    past hx tingling left arm    Prostate enlargement 2010   See's Urologist    Past Surgical History:  Procedure Laterality Date   COLONOSCOPY     COLONOSCOPY WITH PROPOFOL N/A 11/02/2016   Procedure: COLONOSCOPY WITH PROPOFOL;  Surgeon: Iva Boop, MD;  Location: [facility];  Service: Endoscopy;  Laterality: N/A;   COLONOSCOPY WITH PROPOFOL N/A 05/18/2020   Procedure: COLONOSCOPY WITH PROPOFOL;  Surgeon: Iva Boop, MD;  Location: [facility];  Service: Endoscopy;  Laterality: N/A;   LEG SURGERY Right 1997   accident related   XI ROBOTIC ASSISTED SIMPLE PROSTATECTOMY N/A 10/05/2017   Procedure: XI ROBOTIC ASSISTED SIMPLE PROSTATECTOMY WITH UMBILICAL HERNIA REPAIR;  Surgeon: Malen Gauze, MD;  Location: [facility];  Service: Urology;  Laterality: N/A;   No Known Allergies    Objective:    Physical Exam Vitals and nursing note reviewed.  Constitutional:      General: He is not in acute distress.    Appearance: Normal appearance.  HENT:     Head: Normocephalic.     Right Ear: Tympanic membrane and ear canal normal.     Left Ear: Tympanic membrane and ear canal normal.     Nose: Congestion and rhinorrhea present. Rhinorrhea is clear.     Right Sinus: No maxillary sinus tenderness or frontal sinus tenderness.     Left Sinus: No maxillary sinus tenderness or frontal sinus tenderness.     Mouth/Throat:     Mouth: Mucous membranes are moist.     Pharynx: No pharyngeal swelling,  oropharyngeal exudate, posterior oropharyngeal erythema or uvula swelling.     Tonsils: No tonsillar exudate or tonsillar abscesses.  Cardiovascular:     Rate and Rhythm: Normal rate and regular rhythm.  Pulmonary:     Effort: Pulmonary effort is normal.     Breath sounds: Normal breath sounds.  Musculoskeletal:        General: Normal range of motion.     Cervical back: Normal range of motion.  Lymphadenopathy:     Head:     Right side of head: No preauricular or posterior auricular adenopathy.     Left side of head: No preauricular or posterior auricular  adenopathy.     Cervical: No cervical adenopathy.  Skin:    General: Skin is warm and dry.  Neurological:     Mental Status: He is alert and oriented to person, place, and time.  Psychiatric:        Mood and Affect: Mood normal.    BP 124/77 (BP Location: Left Arm, Patient Position: Sitting, Cuff Size: Normal)   Pulse 76   Temp 97.8 F (36.6 C) (Temporal)   Ht 5\' 7"  (1.702 m)   Wt 153 lb 2 oz (69.5 kg)   SpO2 98%   BMI 23.98 kg/m  Wt Readings from Last 3 Encounters:  01/22/24 153 lb 2 oz (69.5 kg)  11/16/23 154 lb (69.9 kg)  11/08/23 155 lb 3.2 oz (70.4 kg)      Dulce Sellar, NP

## 2024-01-22 NOTE — Patient Instructions (Addendum)
 It was very nice to see you today!   I will review your lab results via MyChart in a few days.   Have a great rest of the week!   PLEASE NOTE:  If you had any lab tests please let us know if you have not heard back within a few days. You may see your results on MyChart before we have a chance to review them but we will give you a call once they are reviewed by [place]. If we ordered any referrals today, please let us know if you have not heard from their office within the next week.

## 2024-02-05 ENCOUNTER — Other Ambulatory Visit: Payer: Self-pay | Admitting: Family Medicine

## 2024-02-14 ENCOUNTER — Other Ambulatory Visit: Payer: Self-pay | Admitting: Family Medicine

## 2024-02-25 ENCOUNTER — Other Ambulatory Visit: Payer: Self-pay | Admitting: Family Medicine

## 2024-02-28 ENCOUNTER — Ambulatory Visit: Payer: Self-pay

## 2024-02-28 NOTE — Telephone Encounter (Signed)
 Copied from CRM [id number]. Topic: Clinical - Red Word Triage >> Feb 28, 2024  5:35 PM Chuck Crater wrote: Red Word that prompted transfer to Nurse Triage: Patient is having extreme pain in both of his legs.  Chief Complaint: Bilateral leg pain Symptoms: Moderate pain Frequency: A few days Pertinent Negatives: Patient denies swelling Disposition: [] ED /[] Urgent Care (no appt availability in office) / [x] Appointment(In office/virtual)/ []  [facility]/ [] Home Care/ [] Refused Recommended Disposition /[] [facility]/ []  Follow-up with PCP Additional Notes: Patient called in to report bilateral leg pain. Patient stated pain has been ongoing for a few days. Patient stated pain extends from hips down. Patient denied swelling and redness. Patient rated pain a 7 at this time. Patient stated he is able to walk normally. Patient denied additional symptoms at this time. Advised patient to be seen within 24 hours, per protocol. Scheduled appointment with PCP tomorrow morning. Provided care advice and instructed patient to call back if symptoms worsen. Patient complied.   Reason for Disposition  [1] MODERATE pain (e.g., interferes with normal activities, limping) AND [2] present > 3 days  Answer Assessment - Initial Assessment Questions 1. ONSET: "When did the pain start?"      A few days 2. LOCATION: "Where is the pain located?"      Both legs, hip all the way down  3. PAIN: "How bad is the pain?"    (Scale 1-10; or mild, moderate, severe)   -  MILD (1-3): doesn't interfere with normal activities    -  MODERATE (4-7): interferes with normal activities (e.g., work or school) or awakens from sleep, limping    -  SEVERE (8-10): excruciating pain, unable to do any normal activities, unable to walk     Rates pain a 7 at this time, states he is still able to walk around, but he "feels a strange pain" States elevating legs makes the pain better 4. WORK OR EXERCISE: "Has there been any recent  work or exercise that involved this part of the body?"      Denies 5. CAUSE: "What do you think is causing the leg pain?"     Unknown, states he has not experienced this before 6. OTHER SYMPTOMS: "Do you have any other symptoms?" (e.g., chest pain, back pain, breathing difficulty, swelling, rash, fever, numbness, weakness)     Denies redness, denies swelling, denies pain elsewhere, denies numbness/weakness, denies chest pain, denies difficulty breathing  Protocols used: Leg Pain-A-AH

## 2024-02-29 ENCOUNTER — Encounter: Payer: Self-pay | Admitting: Family Medicine

## 2024-02-29 ENCOUNTER — Ambulatory Visit (INDEPENDENT_AMBULATORY_CARE_PROVIDER_SITE_OTHER): Admitting: Family Medicine

## 2024-02-29 VITALS — BP 112/70 | HR 73 | Temp 97.3°F | Ht 67.0 in | Wt 153.0 lb

## 2024-02-29 DIAGNOSIS — M79604 Pain in right leg: Secondary | ICD-10-CM | POA: Diagnosis not present

## 2024-02-29 DIAGNOSIS — I10 Essential (primary) hypertension: Secondary | ICD-10-CM

## 2024-02-29 DIAGNOSIS — E1169 Type 2 diabetes mellitus with other specified complication: Secondary | ICD-10-CM

## 2024-02-29 DIAGNOSIS — Z7984 Long term (current) use of oral hypoglycemic drugs: Secondary | ICD-10-CM

## 2024-02-29 DIAGNOSIS — M79605 Pain in left leg: Secondary | ICD-10-CM | POA: Diagnosis not present

## 2024-02-29 DIAGNOSIS — E785 Hyperlipidemia, unspecified: Secondary | ICD-10-CM

## 2024-02-29 LAB — COMPREHENSIVE METABOLIC PANEL WITH GFR
ALT: 50 U/L (ref 0–53)
AST: 25 U/L (ref 0–37)
Albumin: 4.3 g/dL (ref 3.5–5.2)
Alkaline Phosphatase: 67 U/L (ref 39–117)
BUN: 21 mg/dL (ref 6–23)
CO2: 26 meq/L (ref 19–32)
Calcium: 9.5 mg/dL (ref 8.4–10.5)
Chloride: 106 meq/L (ref 96–112)
Creatinine, Ser: 1.02 mg/dL (ref 0.40–1.50)
GFR: 72.87 mL/min (ref >60.00)
Glucose, Bld: 196 mg/dL — ABNORMAL HIGH (ref 70–99)
Potassium: 4.2 meq/L (ref 3.5–5.1)
Sodium: 140 meq/L (ref 135–145)
Total Bilirubin: 0.5 mg/dL (ref 0.2–1.2)
Total Protein: 6.9 g/dL (ref 6.0–8.3)

## 2024-02-29 LAB — CK: Total CK: 113 U/L (ref 7–232)

## 2024-02-29 NOTE — Progress Notes (Signed)
 Phone [phone] In person visit   Subjective:   Preston Pennington is a 73 y.o. year old very pleasant male patient who presents for/with See problem oriented charting Chief Complaint  Patient presents with   bilateral leg pain    Pt c/o bilateral muscle pain a few days ago.    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 03/31/2019    Priority: Medium    Hyperlipidemia associated with type 2 diabetes mellitus (HCC) 07/09/2018    Priority: Medium    Left cervical radiculopathy 09/24/2017    Priority: Medium    Hx of colonic polyps 11/06/2016    Priority: Medium    Chronic ankle pain 06/14/2016    Priority: Medium    Thrombocytopenia (HCC) 11/29/2015    Priority: Medium    Gout 04/30/2015    Priority: Medium    Essential hypertension 04/09/2013    Priority: Medium    BPH (benign prostatic hyperplasia) 04/09/2013    Priority: Medium    Leg length difference, acquired 06/20/2019    Priority: Low   Arthritis of first metatarsophalangeal (MTP) joint of right foot 05/30/2019    Priority: Low   Sesamoiditis 11/08/2018    Priority: Low   Osteoarthritis of spine with radiculopathy, cervical region 11/14/2017    Priority: Low   Erectile dysfunction 02/04/2015    Priority: Low   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     acetaminophen  (TYLENOL ) 500 MG tablet Take 1,000 mg by mouth every 6 (six) hours as needed for moderate pain.      allopurinol  (ZYLOPRIM ) 100 MG  tablet Take 1 tablet by mouth once daily 90 tablet 0   amLODipine  (NORVASC ) 5 MG tablet Take 1 tablet by mouth once daily 90 tablet 0   azelastine  (ASTELIN ) 0.1 % nasal spray Place 2 sprays into both nostrils 2 (two) times daily. For runny nose. 30 mL 12   colchicine  0.6 MG tablet TAKE 2 TABLETS BY MOUTH AT THE FIRST SIGN OF GOUT THEN TAKE 1 TABLET 2 HOURS LATER on day 1 (max 3 tablets), then take daily UNTIL FLARE IS RESOLVED " 60 tablet 3   dorzolamide-timolol (COSOPT) 2-0.5 % ophthalmic solution Place 1 drop into both eyes 2 (two) times daily.     feeding supplement, ENSURE ENLIVE, (ENSURE ENLIVE) LIQD Take 1 Bottle by mouth daily.     gabapentin  (NEURONTIN ) 300 MG capsule TAKE 1 CAPSULE BY MOUTH IN THE MORNING AND 1 AT BEDTIME 60 capsule 0   glipiZIDE  (GLUCOTROL ) 5 MG tablet TAKE 1 TABLET BY MOUTH TWICE DAILY BEFORE MEAL(S) 180 tablet 0   glucose blood (ACCU-CHEK GUIDE) test strip Use to test blood sugars 4 times daily. Dx: E11.9 100 each 0   JARDIANCE  10 MG TABS tablet TAKE 1 TABLET BY MOUTH ONCE DAILY BEFORE BREAKFAST 90 tablet 0   latanoprost (XALATAN) 0.005 % ophthalmic solution Place 1 drop into both eyes at bedtime.      lidocaine  (LIDODERM ) 5 % Place 1 patch onto  the skin daily. Remove & Discard patch within 12 hours or as directed by MD 30 patch 0   lisinopril  (ZESTRIL ) 20 MG tablet Take 1 tablet by mouth once daily 90 tablet 0   metFORMIN  (GLUCOPHAGE ) 1000 MG tablet TAKE 1 TABLET BY MOUTH TWICE DAILY WITH MEALS 180 tablet 0   Multiple Vitamin (MULTIVITAMIN ADULT PO) Take by mouth.     Polyethyl Glycol-Propyl Glycol (SYSTANE) 0.4-0.3 % SOLN Apply to eye. For dry eyes 3 x a day     Tetrahyd-Glyc-Hypro-PEG-ZnSulf (VISINE TOTALITY MULTI-SYMPTOM OP) Apply 1 drop to eye in the morning, at noon, and at bedtime.     No current facility-administered medications for this visit.     Objective:  BP 112/70   Pulse 73   Temp (!) 97.3 F (36.3 C)   Ht 5\' 7"  (1.702 m)   Wt 153 lb (69.4 kg)    SpO2 95%   BMI 23.96 kg/m  Gen: NAD, resting comfortably CV: RRR no murmurs rubs or gallops Lungs: CTAB no crackles, wheeze, rhonchi Ext: no edema, diffusely at least moderately tender throughout musculature below the waist.  Mild pain with palpation in paraspinous muscles in the back. Skin: warm, dry Neuro: Good sensation and good strength in lower legs    Assessment and Plan    # Bilateral leg pain S: Patient reports bilateral myalgias and upper legs for a few days.  Denies fall or injury. Aches throughout the legs from his thighs down into his shins and calves. Joints also bothersome like the knees but muscle seems to be the worst part. Still takes his basleine gabapentin - not sure if it helps much. No incontinence.   Worse in the evenings- has tried diclofenac  rub without relief. Elevating overnight helps legs and feels best in the morning  Never had similar happen to him. No increased exercise lately. No leg weakness. No swelling in legs. No calf pain. Some back pain- diclofenac  helps as well but less than the legs- not a new issues. No new medication(s) lately  A/P: bilateral leg pain primarily in muscle groups - check for muscle breakdown with CK -also plan was to go down to once a week on cholesterol medicine previously but he did not get the message- we opted to STOP all cholesterol medicine for now as also had some liver test elevation last visit- we are rechecking that today -I want him to update me in about 3 weeks - if doing better and labs were ok may be able to restart cholesterol medicine just once a week -I do not think this is a back related leg pain with how sensitive to touch all his muscle groups are.   #hypertension S: compliant with Amlodipine  5 mg once daily and lisinopril  20 mg daily.  BP Readings from Last 3 Encounters:  02/29/24 112/70  01/22/24 124/77  11/16/23 100/60   A/P: Controlled. Continue current medications.  #hyperlipidemia S: compliant with  Atorvastatin  10 mg 3 times weekly.  In early 2025 recommended reducing to once weekly with mild LFT bump but he did not get this message-see bilateral leg pain section above  A/P: I am concerned his leg pain could be myalgia related from statin-less likely rhabdo/muscle breakdown-check LFTs as well as CK today and hold statin and have him update me in about 3 weeks-sooner if new or worsening symptoms  Recommended follow up: Return in about 3 months (around 05/31/2024) for followup or sooner if needed.Schedule b4 you leave. Future Appointments  Date Time  Provider Department Center  04/04/2024 10:30 AM [facility] LAB [facility] None  04/04/2024 11:00 AM Pasam, Avinash, MD [facility] None  04/17/2024  8:00 AM [facility] ANNUAL WELLNESS VISIT 1 [facility]    Lab/Order associations:   ICD-10-CM   1. Bilateral leg pain  M79.604 CK (Creatine Kinase)   M79.605 CBC with Differential/Platelet    Comprehensive metabolic panel with GFR    2. Essential hypertension  I10     3. Hyperlipidemia associated with type 2 diabetes mellitus (HCC)  E11.69    E78.5       No orders of the defined types were placed in this encounter.   Return precautions advised.  Clarisa Crooked, MD

## 2024-02-29 NOTE — Patient Instructions (Addendum)
 Please stop by lab before you go If you have mychart- we will send your results within 3 business days of us  receiving them.  If you do not have mychart- we will call you about results within 5 business days of us  receiving them.  *please also note that you will see labs on mychart as soon as they post. I will later go in and write notes on them- will say "notes from Dr. Arlene Ben"   Hold atorvastatin  3x a week for now- lets see how symptoms do over next 3 weeks- please update me by phone call or mychart at that time- with diabetes we do want you on something but we may use a lower dose- I'm removing the 3x a week from your medication list -if new or worsening symptoms let us  know sooner or seek care  Recommended follow up: Return in about 3 months (around 05/31/2024) for followup or sooner if needed.Schedule b4 you leave.

## 2024-02-29 NOTE — Telephone Encounter (Signed)
 Appt today

## 2024-03-01 LAB — CBC WITH DIFFERENTIAL/PLATELET
Basophils Absolute: 0.1 10*3/uL (ref 0.0–0.1)
Basophils Relative: 1.5 % (ref 0.0–3.0)
Eosinophils Absolute: 0.2 10*3/uL (ref 0.0–0.7)
Eosinophils Relative: 4.5 % (ref 0.0–5.0)
HCT: 46.7 % (ref 39.0–52.0)
Hemoglobin: 14.7 g/dL (ref 13.0–17.0)
Lymphocytes Relative: 36.8 % (ref 12.0–46.0)
Lymphs Abs: 1.5 10*3/uL (ref 0.7–4.0)
MCHC: 31.4 g/dL (ref 30.0–36.0)
MCV: 83.3 fl (ref 78.0–100.0)
Monocytes Absolute: 0.3 10*3/uL (ref 0.1–1.0)
Monocytes Relative: 7.5 % (ref 3.0–12.0)
Neutro Abs: 2.1 10*3/uL (ref 1.4–7.7)
Neutrophils Relative %: 49.7 % (ref 43.0–77.0)
Platelets: 105 10*3/uL — ABNORMAL LOW (ref 150.0–400.0)
RBC: 5.61 Mil/uL (ref 4.22–5.81)
RDW: 14.4 % (ref 11.5–15.5)
WBC: 4.2 10*3/uL (ref 4.0–10.5)

## 2024-03-03 ENCOUNTER — Encounter: Payer: Self-pay | Admitting: Family Medicine

## 2024-03-04 ENCOUNTER — Other Ambulatory Visit: Payer: Self-pay | Admitting: Family Medicine

## 2024-03-10 ENCOUNTER — Other Ambulatory Visit: Payer: Self-pay | Admitting: Family Medicine

## 2024-03-11 DIAGNOSIS — H2513 Age-related nuclear cataract, bilateral: Secondary | ICD-10-CM | POA: Diagnosis not present

## 2024-03-11 DIAGNOSIS — H401131 Primary open-angle glaucoma, bilateral, mild stage: Secondary | ICD-10-CM | POA: Diagnosis not present

## 2024-03-31 ENCOUNTER — Telehealth: Payer: Self-pay | Admitting: Nurse Practitioner

## 2024-04-04 ENCOUNTER — Inpatient Hospital Stay: Payer: 59

## 2024-04-04 ENCOUNTER — Telehealth: Payer: Self-pay

## 2024-04-04 ENCOUNTER — Inpatient Hospital Stay: Payer: 59 | Admitting: Oncology

## 2024-04-04 ENCOUNTER — Telehealth: Payer: Self-pay | Admitting: Oncology

## 2024-04-04 NOTE — Telephone Encounter (Signed)
 Patient called a short while ago to let us  know he is having car problems and would like to have his appt with Dr. Randye Buttner rescheduled. Scheduler aware.

## 2024-04-04 NOTE — Telephone Encounter (Signed)
 Left a voicemail with patient regarding his missed 10:30 lab appt and 11:00 am appt with Dr. Randye Buttner at [facility]. Informed that a scheduler would reach out to reschedule.

## 2024-04-04 NOTE — Telephone Encounter (Signed)
 Contacted pt to reschedule cancelled appts from [facility] 04/04/24. Unable to reach via phone, voicemail was left.

## 2024-04-11 NOTE — Telephone Encounter (Signed)
 Contacted pt to reschedule cancelled appts from [facility] 04/04/24. Unable to reach via phone, voicemail was left.

## 2024-04-14 NOTE — Telephone Encounter (Signed)
Contacted pt to schedule an appt. Unable to reach via phone, voicemail box was full.

## 2024-04-15 ENCOUNTER — Other Ambulatory Visit: Payer: Self-pay | Admitting: Family Medicine

## 2024-04-17 ENCOUNTER — Ambulatory Visit: Payer: 59

## 2024-04-17 VITALS — BP 122/70 | HR 81 | Temp 98.1°F | Ht 67.0 in | Wt 153.6 lb

## 2024-04-17 DIAGNOSIS — Z713 Dietary counseling and surveillance: Secondary | ICD-10-CM

## 2024-04-17 DIAGNOSIS — Z0000 Encounter for general adult medical examination without abnormal findings: Secondary | ICD-10-CM | POA: Diagnosis not present

## 2024-04-17 NOTE — Patient Instructions (Signed)
 Mr. Preston Pennington , Thank you for taking time out of your busy schedule to complete your Annual Wellness Visit with me. I enjoyed our conversation and look forward to speaking with you again next year. I, as well as your care team,  appreciate your ongoing commitment to your health goals. Please review the following plan we discussed and let me know if I can assist you in the future. Your Game plan/ To Do List    Referrals: If you haven't heard from the office you've been referred to, please reach out to them at the phone provided.   Follow up Visits: Next Medicare AWV with our clinical staff: 04/23/25   Have you seen your provider in the last 6 months (3 months if uncontrolled diabetes)? Yes Next Office Visit with your provider: pt stated he will mae appt   Clinician Recommendations:  Aim for 30 minutes of exercise or brisk walking, 6-8 glasses of water, and 5 servings of fruits and vegetables each day.        This is a list of the screening recommended for you and due dates:  Health Maintenance  Topic Date Due   COVID-19 Vaccine (7 - 2024-25 season) 07/01/2023   Hemoglobin A1C  05/15/2024   Complete foot exam   05/16/2024   Flu Shot  05/30/2024   Eye exam for diabetics  10/10/2024   Yearly kidney health urinalysis for diabetes  11/15/2024   Yearly kidney function blood test for diabetes  02/28/2025   Medicare Annual Wellness Visit  04/17/2025   Colon Cancer Screening  05/18/2025   DTaP/Tdap/Td vaccine (2 - Td or Tdap) 11/28/2025   Pneumococcal Vaccine for age over 15  Completed   Hepatitis C Screening  Completed   Zoster (Shingles) Vaccine  Completed   HPV Vaccine  Aged Out   Meningitis B Vaccine  Aged Out    Advanced directives: (Declined) Advance directive discussed with you today. Even though you declined this today, please call our office should you change your mind, and we can give you the proper paperwork for you to fill out. Advance Care Planning is important because it:  [x]   Makes sure you receive the medical care that is consistent with your values, goals, and preferences  [x]  It provides guidance to your family and loved ones and reduces their decisional burden about whether or not they are making the right decisions based on your wishes.  Follow the link provided in your after visit summary or read over the paperwork we have mailed to you to help you started getting your Advance Directives in place. If you need assistance in completing these, please reach out to us  so that we can help you!  See attachments for Preventive Care and Fall Prevention Tips.

## 2024-04-17 NOTE — Telephone Encounter (Signed)
 Patient has been scheduled. Aware of appt date and time.

## 2024-04-17 NOTE — Progress Notes (Signed)
 Subjective:   Preston Pennington is a 73 y.o. who presents for a Medicare Wellness preventive visit.  As a reminder, Annual Wellness Visits don't include a physical exam, and some assessments may be limited, especially if this visit is performed virtually. We may recommend an in-person follow-up visit with your provider if needed.  Visit Complete: In person    Persons Participating in Visit: Patient.  AWV Questionnaire: No: Patient Medicare AWV questionnaire was not completed prior to this visit.  Cardiac Risk Factors include: advanced age (>2men, >64 women);dyslipidemia;diabetes mellitus;male gender;hypertension     Objective:    Today's Vitals   04/17/24 0821  BP: 122/70  Pulse: 81  Temp: 98.1 F (36.7 C)  SpO2: 93%  Weight: 153 lb 9.6 oz (69.7 kg)  Height: 5' 7 (1.702 m)   Body mass index is 24.06 kg/m.     04/17/2024    8:26 AM 04/12/2023    8:18 AM 01/13/2023    3:17 PM 12/08/2022   11:20 AM 04/13/2022   11:20 AM 04/08/2021   11:14 AM 05/18/2020    9:54 AM  Advanced Directives  Does Patient Have a Medical Advance Directive? No No Yes No No No No  Type of Advance Directive   Living will      Would patient like information on creating a medical advance directive? No - Patient declined No - Patient declined  No - Patient declined No - Patient declined Yes (MAU/Ambulatory/Procedural Areas - Information given) No - Patient declined    Current Medications (verified) Outpatient Encounter Medications as of 04/17/2024  Medication Sig   Accu-Chek Softclix Lancets lancets 4 (four) times daily.   acetaminophen  (TYLENOL ) 500 MG tablet Take 1,000 mg by mouth every 6 (six) hours as needed for moderate pain.    allopurinol  (ZYLOPRIM ) 100 MG tablet Take 1 tablet by mouth once daily   amLODipine  (NORVASC ) 5 MG tablet Take 1 tablet by mouth once daily   azelastine  (ASTELIN ) 0.1 % nasal spray Place 2 sprays into both nostrils 2 (two) times daily. For runny nose.   colchicine  0.6 MG  tablet TAKE 2 TABLETS BY MOUTH AT THE FIRST SIGN OF GOUT THEN TAKE 1 TABLET 2 HOURS LATER on day 1 (max 3 tablets), then take daily UNTIL FLARE IS RESOLVED    dorzolamide-timolol (COSOPT) 2-0.5 % ophthalmic solution Place 1 drop into both eyes 2 (two) times daily.   feeding supplement, ENSURE ENLIVE, (ENSURE ENLIVE) LIQD Take 1 Bottle by mouth daily.   glipiZIDE  (GLUCOTROL ) 5 MG tablet TAKE 1 TABLET BY MOUTH TWICE DAILY BEFORE MEAL(S)   glucose blood (ACCU-CHEK GUIDE) test strip Use to test blood sugars 4 times daily. Dx: E11.9   JARDIANCE  10 MG TABS tablet TAKE 1 TABLET BY MOUTH ONCE DAILY BEFORE BREAKFAST   latanoprost (XALATAN) 0.005 % ophthalmic solution Place 1 drop into both eyes at bedtime.    lidocaine  (LIDODERM ) 5 % Place 1 patch onto the skin daily. Remove & Discard patch within 12 hours or as directed by MD   lisinopril  (ZESTRIL ) 20 MG tablet Take 1 tablet by mouth once daily   metFORMIN  (GLUCOPHAGE ) 1000 MG tablet TAKE 1 TABLET BY MOUTH TWICE DAILY WITH MEALS   Multiple Vitamin (MULTIVITAMIN ADULT PO) Take by mouth.   Polyethyl Glycol-Propyl Glycol (SYSTANE) 0.4-0.3 % SOLN Apply to eye. For dry eyes 3 x a day   Tetrahyd-Glyc-Hypro-PEG-ZnSulf (VISINE TOTALITY MULTI-SYMPTOM OP) Apply 1 drop to eye in the morning, at noon, and at bedtime.   [  DISCONTINUED] gabapentin  (NEURONTIN ) 300 MG capsule TAKE 1 CAPSULE BY MOUTH IN THE MORNING AND 1 AT BEDTIME   No facility-administered encounter medications on file as of 04/17/2024.    Allergies (verified) Patient has no known allergies.   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   Diabetes mellitus without complication (HCC)    Difficult intubation    mouth was not wide enough   Dry eyes    History of adenomatous polyp of colon 11/06/2016   10/2016 adenoma x3 - recall colon 10/2019   Hyperlipidemia    Hypertension    Neuromuscular disorder (HCC)    past hx tingling  left arm    Prostate enlargement 2010   See's Urologist    Past Surgical History:  Procedure Laterality Date   COLONOSCOPY     COLONOSCOPY WITH PROPOFOL  N/A 11/02/2016   Procedure: COLONOSCOPY WITH PROPOFOL ;  Surgeon: Kenney Peacemaker, MD;  Location: [facility];  Service: Endoscopy;  Laterality: N/A;   COLONOSCOPY WITH PROPOFOL  N/A 05/18/2020   Procedure: COLONOSCOPY WITH PROPOFOL ;  Surgeon: Kenney Peacemaker, MD;  Location: [facility];  Service: Endoscopy;  Laterality: N/A;   LEG SURGERY Right 1997   accident related   XI ROBOTIC ASSISTED SIMPLE PROSTATECTOMY N/A 10/05/2017   Procedure: XI ROBOTIC ASSISTED SIMPLE PROSTATECTOMY WITH UMBILICAL HERNIA REPAIR;  Surgeon: Marco Severs, MD;  Location: [facility];  Service: Urology;  Laterality: N/A;   Family History  Problem Relation Age of Onset   Other Father        and mother- states died of old age. States no medical problems in parents or siblings   Colon cancer Neg Hx    Colon polyps Neg Hx    Esophageal cancer Neg Hx    Rectal cancer Neg Hx    Stomach cancer Neg Hx    Social History   Socioeconomic History   Marital status: Married    Spouse name: [name]   Number of children: 4   Years of education: Not on file   Highest education level: Not on file  Occupational History   Not on file  Tobacco Use   Smoking status: Never   Smokeless tobacco: Never  Vaping Use   Vaping status: Never Used  Substance and Sexual Activity   Alcohol use: No    Alcohol/week: 0.0 standard drinks of alcohol   Drug use: No   Sexual activity: Yes  Other Topics Concern   Not on file  Social History Narrative   Married. 4 children (1 boy, 3 girls). 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 Prior [facility]. Also did this in [place].       Hobbies: enjoys reading novels. Enjoys African  authors   Goes to [employer] of [address] ([place])   Social Drivers of Health   Financial Resource Strain: Low Risk  (04/17/2024)   Overall Financial Resource Strain (CARDIA)    Difficulty of Paying Living Expenses: Not hard at all  Food Insecurity: No Food Insecurity (04/17/2024)   Hunger Vital Sign    Worried About Running Out of Food in the Last Year: Never true    Ran Out of Food in the Last Year: Never true  Transportation Needs: No Transportation Needs (04/17/2024)  PRAPARE - Administrator, Civil Service (Medical): No    Lack of Transportation (Non-Medical): No  Physical Activity: Inactive (04/17/2024)   Exercise Vital Sign    Days of Exercise per Week: 0 days    Minutes of Exercise per Session: 0 min  Stress: No Stress Concern Present (04/17/2024)   [employer] of Occupational Health - Occupational Stress Questionnaire    Feeling of Stress: Not at all  Social Connections: Socially Integrated (04/17/2024)   Social Connection and Isolation Panel    Frequency of Communication with Friends and Family: More than three times a week    Frequency of Social Gatherings with Friends and Family: More than three times a week    Attends Religious Services: More than 4 times per year    Active Member of [employer] or Organizations: Yes    Attends Banker Meetings: 1 to 4 times per year    Marital Status: Married    Tobacco Counseling Counseling given: Not Answered    Clinical Intake:  Pre-visit preparation completed: Yes  Pain : No/denies pain     BMI - recorded: 24.06 Nutritional Status: BMI of 19-24  Normal Diabetes: Yes CBG done?: Yes (152per pt) CBG resulted in Enter/ Edit results?: No Did pt. bring in CBG monitor from home?: No  Lab Results  Component Value Date   HGBA1C 6.6 (H) 11/16/2023   HGBA1C 6.9 (H) 05/17/2023   HGBA1C 6.4 11/08/2022     How often do you need to have someone help you when you read instructions, pamphlets,  or other written materials from your doctor or pharmacy?: 1 - Never  Interpreter Needed?: No  Information entered by :: Lamont Pilsner, LPN   Activities of Daily Living     04/17/2024    8:25 AM  In your present state of health, do you have any difficulty performing the following activities:  Hearing? 1  Comment hearing aids  Vision? 0  Difficulty concentrating or making decisions? 0  Walking or climbing stairs? 0  Dressing or bathing? 0  Doing errands, shopping? 0  Preparing Food and eating ? N  Using the Toilet? N  In the past six months, have you accidently leaked urine? N  Do you have problems with loss of bowel control? N  Managing your Medications? N  Managing your Finances? N  Housekeeping or managing your Housekeeping? N    Patient Care Team: Almira Jaeger, MD as PCP - General (Family Medicine) Cindra Cree, MD as Consulting Physician (Ophthalmology)  I have updated your Care Teams any recent Medical Services you may have received from other providers in the past year.     Assessment:   This is a routine wellness examination for Eesa.  Hearing/Vision screen Hearing Screening - Comments:: Hearing aids  Vision Screening - Comments:: Wears rx glasses - up to date with routine eye exams with Dr Cindra Cree     Goals Addressed             This Visit's Progress    Patient Stated       Maintain health and activity        Depression Screen     04/17/2024    8:27 AM 05/17/2023    9:01 AM 04/12/2023    8:15 AM 06/05/2022   11:09 AM 04/13/2022   11:19 AM 10/14/2021   10:22 AM 06/08/2021    9:18 AM  PHQ 2/9 Scores  PHQ - 2 Score 0 0 0  0 0 0 0  PHQ- 9 Score  0         Fall Risk     04/17/2024    8:29 AM 05/17/2023    9:01 AM 04/12/2023    8:19 AM 06/05/2022   11:09 AM 04/13/2022   11:21 AM  Fall Risk   Falls in the past year? 0 0 0 0 0  Number falls in past yr: 0 0 0 0 0  Injury with Fall? 0 0 0 0 0  Risk for fall due to : No Fall Risks No Fall  Risks Impaired vision No Fall Risks Impaired vision  Follow up Falls prevention discussed Falls evaluation completed Falls prevention discussed Falls evaluation completed  Falls prevention discussed      Data saved with a previous flowsheet row definition    MEDICARE RISK AT HOME:  Medicare Risk at Home Any stairs in or around the home?: Yes If so, are there any without handrails?: No Home free of loose throw rugs in walkways, pet beds, electrical cords, etc?: Yes Adequate lighting in your home to reduce risk of falls?: Yes Life alert?: No Use of a cane, walker or w/c?: No Grab bars in the bathroom?: Yes Shower chair or bench in shower?: No Elevated toilet seat or a handicapped toilet?: No  TIMED UP AND GO:  Was the test performed?  Yes  Length of time to ambulate 10 feet: 10 sec Gait steady and fast without use of assistive device  Cognitive Function: 6CIT completed        04/17/2024    8:30 AM 04/12/2023    8:19 AM 04/13/2022   11:22 AM 04/08/2021   11:18 AM  6CIT Screen  What Year? 0 points 0 points 0 points 0 points  What month? 0 points 0 points 0 points 0 points  What time? 0 points 0 points 0 points 0 points  Count back from 20 0 points 0 points 0 points 0 points  Months in reverse 0 points 0 points 0 points 0 points  Repeat phrase 2 points 0 points 0 points 0 points  Total Score 2 points 0 points 0 points 0 points    Immunizations Immunization History  Administered Date(s) Administered   Fluad Quad(high Dose 65+) 07/10/2019, 07/27/2020   Hepatitis B 02/27/2021, 03/30/2021   Influenza Split 07/31/2011   Influenza, High Dose Seasonal PF 09/01/2017   Influenza-Unspecified 07/16/2015, 08/14/2018, 09/14/2021   Moderna Sars-Covid-2 Vaccination 10/27/2019, 11/24/2019, 09/16/2020, 03/17/2021, 07/19/2021   Pfizer Covid-19 Vaccine Bivalent Booster 102yrs & up 06/28/2022   Pneumococcal Conjugate-13 03/29/2017, 02/12/2018   Pneumococcal Polysaccharide-23 07/31/2011,  07/10/2019, 02/24/2020   Tdap 11/29/2015   Zoster Recombinant(Shingrix) 02/12/2018, 05/01/2018    Screening Tests Health Maintenance  Topic Date Due   COVID-19 Vaccine (7 - 2024-25 season) 07/01/2023   HEMOGLOBIN A1C  05/15/2024   FOOT EXAM  05/16/2024   INFLUENZA VACCINE  05/30/2024   OPHTHALMOLOGY EXAM  10/10/2024   Diabetic kidney evaluation - Urine ACR  11/15/2024   Diabetic kidney evaluation - eGFR measurement  02/28/2025   Medicare Annual Wellness (AWV)  04/17/2025   Colonoscopy  05/18/2025   DTaP/Tdap/Td (2 - Td or Tdap) 11/28/2025   Pneumococcal Vaccine: 50+ Years  Completed   Hepatitis C Screening  Completed   Zoster Vaccines- Shingrix  Completed   HPV VACCINES  Aged Out   Meningococcal B Vaccine  Aged Out    Health Maintenance  Health Maintenance Due  Topic Date Due   COVID-19  Vaccine (7 - 2024-25 season) 07/01/2023   Health Maintenance Items Addressed: See Nurse Notes at the end of this note  Additional Screening:  Vision Screening: Recommended annual ophthalmology exams for early detection of glaucoma and other disorders of the eye. Would you like a referral to an eye doctor? No    Dental Screening: Recommended annual dental exams for proper oral hygiene  Community Resource Referral / Chronic Care Management: CRR required this visit?  No   CCM required this visit?  No   Plan:    I have personally reviewed and noted the following in the patient's chart:   Medical and social history Use of alcohol, tobacco or illicit drugs  Current medications and supplements including opioid prescriptions. Patient is not currently taking opioid prescriptions. Functional ability and status Nutritional status Physical activity Advanced directives List of other physicians Hospitalizations, surgeries, and ER visits in previous 12 months Vitals Screenings to include cognitive, depression, and falls Referrals and appointments  In addition, I have reviewed and  discussed with patient certain preventive protocols, quality metrics, and best practice recommendations. A written personalized care plan for preventive services as well as general preventive health recommendations were provided to patient.   Bruno Capri, LPN   [id number]/78/2956   After Visit Summary: (MyChart) Due to this being a telephonic visit, the after visit summary with patients personalized plan was offered to patient via MyChart   Notes: Nothing significant to report at this time.

## 2024-04-18 ENCOUNTER — Ambulatory Visit (INDEPENDENT_AMBULATORY_CARE_PROVIDER_SITE_OTHER): Admitting: Family Medicine

## 2024-04-18 ENCOUNTER — Encounter: Payer: Self-pay | Admitting: Family Medicine

## 2024-04-18 VITALS — BP 115/69 | HR 68 | Temp 97.9°F | Ht 67.0 in | Wt 153.0 lb

## 2024-04-18 DIAGNOSIS — D696 Thrombocytopenia, unspecified: Secondary | ICD-10-CM

## 2024-04-18 DIAGNOSIS — I10 Essential (primary) hypertension: Secondary | ICD-10-CM | POA: Diagnosis not present

## 2024-04-18 DIAGNOSIS — R58 Hemorrhage, not elsewhere classified: Secondary | ICD-10-CM

## 2024-04-18 NOTE — Patient Instructions (Addendum)
 It was very nice to see you today!  You have bleeding under the skin in your abdomen.  This is called ecchymosis.  This will improve over the next 1 to 2 weeks.  Let us  know if you have any new spots appear or if this does not continue to improve.   Please keep your appoint with your hematologist.  Let us  know if you would like for us  to get blood work.  Return if symptoms worsen or fail to improve.   Take care, Dr Daneil Dunker  PLEASE NOTE:  If you had any lab tests, please let us  know if you have not heard back within a few days. You may see your results on mychart before we have a chance to review them but we will give you a call once they are reviewed by us .   If we ordered any referrals today, please let us  know if you have not heard from their office within the next week.   If you had any urgent prescriptions sent in today, please check with the pharmacy within an hour of our visit to make sure the prescription was transmitted appropriately.   Please try these tips to maintain a healthy lifestyle:  Eat at least 3 REAL meals and 1-2 snacks per day.  Aim for no more than 5 hours between eating.  If you eat breakfast, please do so within one hour of getting up.   Each meal should contain half fruits/vegetables, one quarter protein, and one quarter carbs (no bigger than a computer mouse)  Cut down on sweet beverages. This includes juice, soda, and sweet tea.   Drink at least 1 glass of water with each meal and aim for at least 8 glasses per day  Exercise at least 150 minutes every week.

## 2024-04-18 NOTE — Progress Notes (Signed)
   Preston Pennington is a 73 y.o. male who presents today for an office visit.  Assessment/Plan:  Ecchymosis  Area on abdomen consistent with ecchymosis though he has not had any obvious trauma or precipitating events.  He may have had mild subtle trauma that precipitated this that he cannot recall.  He does have known history of thrombocytopenia which is likely contributing.  No recent aspirin or NSAID use.  We did discuss checking CBC and INR today to further evaluate however he would like to hold off on this for now.  Anticipate that his ecchymosis will continue to improve over the next couple of weeks.  We discussed reasons to return to care.  Thrombocytopenia Likely contributing to above.  Has appointment with hematology in a couple of weeks.  We did discuss checking labs today due to his ecchymosis as above however he would like to hold off on this for now.  He will let us  know if he changes his mind.  Essential hypertension At goal today on amlodipine  5 mg daily.    Subjective:  HPI:  See Assessment / plan for status of chronic conditions. Patient here today with skin discoloration.  Started noticing this a few days ago.  Located on his left abdomen.  Noticed a darker discoloration.  This area seems to be improving over the last few days.  No obvious trauma or precipitating events.  Area is not painful.  No fevers or chills.  No recent illnesses.  He has not had any change in medications.  No recent aspirin or anti-inflammatory use.  No overt bleeding.        Objective:  Physical Exam: There were no vitals taken for this visit.  Gen: No acute distress, resting comfortably CV: Regular rate and rhythm with no murmurs appreciated Pulm: Normal work of breathing, clear to auscultation bilaterally with no crackles, wheezes, or rhonchi Skin: Left upper abdominal wall with approximately 3 x 8 cm area of ecchymosis. Neuro: Grossly normal, moves all extremities Psych: Normal affect and thought  content      Amorette Charrette M. Daneil Dunker, MD 04/18/2024 10:46 AM

## 2024-04-30 ENCOUNTER — Inpatient Hospital Stay: Attending: Oncology

## 2024-04-30 ENCOUNTER — Inpatient Hospital Stay: Admitting: Oncology

## 2024-04-30 ENCOUNTER — Encounter: Payer: Self-pay | Admitting: Oncology

## 2024-04-30 VITALS — BP 128/76 | HR 99 | Temp 97.9°F | Resp 18 | Ht 67.0 in | Wt 153.6 lb

## 2024-04-30 DIAGNOSIS — R682 Dry mouth, unspecified: Secondary | ICD-10-CM | POA: Insufficient documentation

## 2024-04-30 DIAGNOSIS — D709 Neutropenia, unspecified: Secondary | ICD-10-CM | POA: Diagnosis not present

## 2024-04-30 DIAGNOSIS — D696 Thrombocytopenia, unspecified: Secondary | ICD-10-CM

## 2024-04-30 LAB — CBC WITH DIFFERENTIAL/PLATELET
Abs Immature Granulocytes: 0.02 10*3/uL (ref 0.00–0.07)
Basophils Absolute: 0 10*3/uL (ref 0.0–0.1)
Basophils Relative: 1 %
Eosinophils Absolute: 0.3 10*3/uL (ref 0.0–0.5)
Eosinophils Relative: 5 %
HCT: 46.6 % (ref 39.0–52.0)
Hemoglobin: 15 g/dL (ref 13.0–17.0)
Immature Granulocytes: 0 %
Lymphocytes Relative: 35 %
Lymphs Abs: 1.8 10*3/uL (ref 0.7–4.0)
MCH: 25.9 pg — ABNORMAL LOW (ref 26.0–34.0)
MCHC: 32.2 g/dL (ref 30.0–36.0)
MCV: 80.5 fL (ref 80.0–100.0)
Monocytes Absolute: 0.4 10*3/uL (ref 0.1–1.0)
Monocytes Relative: 8 %
Neutro Abs: 2.7 10*3/uL (ref 1.7–7.7)
Neutrophils Relative %: 51 %
Platelets: 101 10*3/uL — ABNORMAL LOW (ref 150–400)
RBC: 5.79 MIL/uL (ref 4.22–5.81)
RDW: 13.6 % (ref 11.5–15.5)
WBC: 5.3 10*3/uL (ref 4.0–10.5)
nRBC: 0 % (ref 0.0–0.2)

## 2024-04-30 NOTE — Progress Notes (Signed)
 [facility]  HEMATOLOGY CLINIC PROGRESS NOTE  PATIENT NAME: Preston Pennington   MR#: [id number] DOB: Nov 06, 1950  Patient Care Team: Katrinka Garnette MALVA, MD as PCP - General (Family Medicine) Waylan Cain, MD as Consulting Physician (Ophthalmology)  Date of visit: 04/30/2024   ASSESSMENT & PLAN:   Preston Pennington is a 73 y.o. gentleman, originally from [place], with a past medical history of  adenomatous colon polyps, hypertension, hyperlipidemia, diabetes mellitus type 2, glaucoma, enlarged prostate, neuromuscular disorder, Dupuytren's contracture, gout, presented to clinic for follow-up of chronic thrombocytopenia early since early 2000's and mild intermittent leukopenia.  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  Thrombocytopenia (HCC) Long-standing, stable mild thrombocytopenia and leukopenia. No concerning findings on extensive workup including hematologic, rheumatological and infectious disease testing. No evidence of immature cells or blasts in the blood. No symptoms of bleeding or bruising.  -Labs today showed stable platelet count of 101,000.  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  However no hematological intervention would be warranted at current platelet levels.  If platelet count is below 30,000, then treatment with steroids could be considered.  However platelet count has remained stable for at least 20 years for him.  So I do not see the need for this currently.  -Continue monitoring with blood work every 6 months.  Hx of intermittent Neutropenia (HCC) - Fluctuating neutropenia/leukopenia.  However lately his white count has been within normal limits.  White count is currently normal at 5300 with ANC of 2700, normal differential.  -He has had extensive workup for thrombocytopenia and leukopenia as mentioned under hematology history. 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  Medications including allopurinol  and  colchicine  could be contributing. No additional intervention is warranted at current levels.  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 Reports of dry mouth. No evidence of Sjogren's syndrome on previous testing. -Continue use of hard candy/lozenges to stimulate salivary glands. -Use non-alcoholic mouthwash, especially at night.   I spent a total of 20 minutes during this encounter with the patient including review of chart and various tests results, discussions about plan of care and coordination of care plan.  I reviewed lab results and outside records for this visit and discussed relevant results with the patient. Diagnosis, plan of care and treatment options were also discussed in detail with the patient. Opportunity provided to ask questions and answers provided to his apparent satisfaction. Provided instructions to call our clinic with any problems, questions or concerns prior to return visit. I recommended to continue follow-up with PCP and sub-specialists. He verbalized understanding and agreed with the plan. No barriers to learning was detected.  Chinita Patten, MD  04/30/2024 1:58 PM  [facility] - A DEPT OF [facility] [address] [place] [address] Dept: [phone] Dept Fax: [phone]   CHIEF COMPLAINT/ REASON FOR VISIT:  Follow-up for history of chronic thrombocytopenia likely since early 2000's and mild intermittent leukopenia.  INTERVAL HISTORY:  Discussed the use of AI scribe software for clinical note transcription with the patient, who gave verbal consent to proceed.  History of Present Illness Preston Pennington is a 73 year old male who presents for routine follow-up of thrombocytopenia and leukopenia.  He has experienced slightly low platelet counts and occasionally low white blood cell counts for over twenty years. Platelet counts fluctuate, generally around 100,000, and his  white blood cell count has  been normal recently, including a count of 5,300 today. No bleeding problems are reported.  He has been undergoing regular blood work with his primary doctor and during these follow-ups. Previous workups, including bone marrow evaluations, have not shown any concerning findings.  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 No new major health concerns or symptoms have been reported since his last visit.   SUMMARY OF HEMATOLOGIC HISTORY:  November 08, 2022: WBC 3.7 hemoglobin 14.5 platelet count 97; 39 segs 45 lymphs 10 monos 5 eos 1 basophil ANC 1.5 CMP notable for glucose of 170   December 08 2022:  [facility] Patient has frequent gout attacks that typically involve his right foot.  States that he has been on colchicine  for about 8 years.  Claims that DM Type II is well controlled. He states that his PLT count has been low for at least 15 yrs.  No bleeding problems.   Social:  Originally from [place].  Works in a nursing home.  Married.  Tobacco none.  EtOH none.     WBC 3.6 hemoglobin 14.7 MCV 81 platelet count 93; 40 segs 45 lymphs 9 monos 5 eos 1 basophil.  ANC 1.4.  Smear for morphology review notable for large platelets. Coombs test negative. Haptoglobin 109 Fibrinogen  242 INR 1.0 SPEP with IP showed no paraprotein IgG 1120 IgA 169 IgM 67 ANA panel negative.  Rheumatoid factor negative Hepatitis panel negative.  HIV negative Folate 16.6. Copper  93, B12 694, zinc  88   December 27, 2022: Ultrasound of left breast including axilla.  Oval area of subtle ill-defined increased echogenicity within the subcutaneous fat lobule at 9 o'clock position left breast 1 cm from nipple measuring 3.8 x 2.8 x 0.8.  Contains 4 small cystic areas.  Impression was evolution of an area of fat necrosis with developing oil cyst at 9 o'clock position no evidence of malignancy    January 19, 2023: Abdominal ultrasound spleen  normal in size and appearance.  Liver mildly echogenic.  Compatible with hepatic steatosis   February 21, 2023: WBC 4.6 hemoglobin 15.4 platelet count 107 differential normal.  Glucose 214   Mar 30 2023:   Scheduled follow up for thrombocytopenia.  Reviewed results of labs and U/S with patient   No bleeding issues.  Platelet 109,000.  CMV, EBV, QuantiFERON testing, cold agglutinin titer were all negative.   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  Medications including allopurinol  and colchicine  could be contributing.  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  I have reviewed the past medical history, past surgical history, social history and family history with the patient and they are unchanged from previous note.  ALLERGIES: He has no known allergies.  MEDICATIONS:  Current Outpatient Medications  Medication Sig Dispense Refill   Accu-Chek Softclix Lancets lancets 4 (four) times daily.     acetaminophen  (TYLENOL ) 500 MG tablet Take 1,000 mg by mouth every 6 (six) hours as needed for moderate pain.      allopurinol  (ZYLOPRIM ) 100 MG tablet Take 1 tablet by mouth once daily 90 tablet 0   amLODipine  (NORVASC ) 5 MG tablet Take 1 tablet by mouth once daily 90 tablet 0   azelastine  (ASTELIN ) 0.1 % nasal spray Place 2 sprays into both nostrils 2 (two) times daily. For runny nose. 30 mL 12   colchicine  0.6 MG tablet TAKE 2 TABLETS BY  MOUTH AT THE FIRST SIGN OF GOUT THEN TAKE 1 TABLET 2 HOURS LATER on day 1 (max 3 tablets), then take daily UNTIL FLARE IS RESOLVED  60 tablet 3   dorzolamide-timolol (COSOPT) 2-0.5 % ophthalmic solution Place 1 drop into both eyes 2 (two) times daily.     gabapentin  (NEURONTIN ) 300 MG capsule TAKE 1 CAPSULE BY MOUTH IN THE MORNING AND 1 AT BEDTIME 60 capsule 0   glipiZIDE  (GLUCOTROL ) 5 MG tablet TAKE 1 TABLET BY MOUTH TWICE DAILY BEFORE MEAL(S) 180 tablet 0   glucose blood (ACCU-CHEK GUIDE) test strip Use to test blood sugars 4 times daily. Dx: E11.9  100 each 0   JARDIANCE  10 MG TABS tablet TAKE 1 TABLET BY MOUTH ONCE DAILY BEFORE BREAKFAST 90 tablet 0   latanoprost (XALATAN) 0.005 % ophthalmic solution Place 1 drop into both eyes at bedtime.      lisinopril  (ZESTRIL ) 20 MG tablet Take 1 tablet by mouth once daily 90 tablet 0   metFORMIN  (GLUCOPHAGE ) 1000 MG tablet TAKE 1 TABLET BY MOUTH TWICE DAILY WITH MEALS 180 tablet 0   Multiple Vitamin (MULTIVITAMIN ADULT PO) Take by mouth.     Polyethyl Glycol-Propyl Glycol (SYSTANE) 0.4-0.3 % SOLN Apply to eye. For dry eyes 3 x a day     Tetrahyd-Glyc-Hypro-PEG-ZnSulf (VISINE TOTALITY MULTI-SYMPTOM OP) Apply 1 drop to eye in the morning, at noon, and at bedtime.     feeding supplement, ENSURE ENLIVE, (ENSURE ENLIVE) LIQD Take 1 Bottle by mouth daily. (Patient not taking: Reported on 04/30/2024)     No current facility-administered medications for this visit.     REVIEW OF SYSTEMS:    Review of Systems - Oncology  All other pertinent systems were reviewed with the patient and are negative.  PHYSICAL EXAMINATION:   Onc Performance Status - 04/30/24 1146       ECOG Perf Status   ECOG Perf Status Restricted in physically strenuous activity but ambulatory and able to carry out work of a light or sedentary nature, e.g., light house work, office work      KPS SCALE   KPS % SCORE Able to carry on normal activity, minor s/s of disease          Vitals:   04/30/24 1137  BP: 128/76  Pulse: 99  Resp: 18  Temp: 97.9 F (36.6 C)  SpO2: 96%   Filed Weights   04/30/24 1137  Weight: 153 lb 9.6 oz (69.7 kg)    Physical Exam Constitutional:      General: He is not in acute distress.    Appearance: Normal appearance.  HENT:     Head: Normocephalic and atraumatic.  Eyes:     Conjunctiva/sclera: Conjunctivae normal.  Cardiovascular:     Rate and Rhythm: Normal rate and regular rhythm.  Pulmonary:     Effort: Pulmonary effort is normal. No respiratory distress.  Abdominal:     General:  There is no distension.  Neurological:     General: No focal deficit present.     Mental Status: He is alert and oriented to person, place, and time.  Psychiatric:        Mood and Affect: Mood normal.        Behavior: Behavior normal.     LABORATORY DATA:   I have reviewed the data as listed.  Results for orders placed or performed in visit on 04/30/24  CBC with Differential/Platelet  Result Value Ref Range   WBC 5.3 4.0 - 10.5 K/uL  RBC 5.79 4.22 - 5.81 MIL/uL   Hemoglobin 15.0 13.0 - 17.0 g/dL   HCT 53.3 60.9 - 47.9 %   MCV 80.5 80.0 - 100.0 fL   MCH 25.9 (L) 26.0 - 34.0 pg   MCHC 32.2 30.0 - 36.0 g/dL   RDW 86.3 88.4 - 84.4 %   Platelets 101 (L) 150 - 400 K/uL   nRBC 0.0 0.0 - 0.2 %   Neutrophils Relative % 51 %   Neutro Abs 2.7 1.7 - 7.7 K/uL   Lymphocytes Relative 35 %   Lymphs Abs 1.8 0.7 - 4.0 K/uL   Monocytes Relative 8 %   Monocytes Absolute 0.4 0.1 - 1.0 K/uL   Eosinophils Relative 5 %   Eosinophils Absolute 0.3 0.0 - 0.5 K/uL   Basophils Relative 1 %   Basophils Absolute 0.0 0.0 - 0.1 K/uL   Immature Granulocytes 0 %   Abs Immature Granulocytes 0.02 0.00 - 0.07 K/uL     RADIOGRAPHIC STUDIES:  No recent pertinent imaging studies available to review.  Orders Placed This Encounter  Procedures   CBC with Differential (Cancer Center Only)    Standing Status:   Future    Expected Date:   11/07/2024    Expiration Date:   04/30/2025     Future Appointments  Date Time Provider Department Center  11/07/2024 11:15 AM [facility] None  11/07/2024 11:45 AM Armarion Greek, Chinita, MD [facility] None  04/23/2025  8:00 AM [facility] ANNUAL WELLNESS VISIT 1 [facility]     This document was completed utilizing speech recognition software. Grammatical errors, random word insertions, pronoun errors, and incomplete sentences are an occasional consequence of this system due to software limitations, ambient noise, and hardware issues. Any formal questions or  concerns about the content, text or information contained within the body of this dictation should be directly addressed to the provider for clarification.

## 2024-04-30 NOTE — Assessment & Plan Note (Signed)
-   Fluctuating neutropenia/leukopenia.  However lately his white count has been within normal limits.  White count is currently normal at 5300 with ANC of 2700, normal differential.  -He has had extensive workup for thrombocytopenia and leukopenia as mentioned under hematology history. 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  Medications including allopurinol  and colchicine  could be contributing. No additional intervention is warranted at current levels.

## 2024-04-30 NOTE — Assessment & Plan Note (Signed)
 Long-standing, stable mild thrombocytopenia and leukopenia. No concerning findings on extensive workup including hematologic, rheumatological and infectious disease testing. No evidence of immature cells or blasts in the blood. No symptoms of bleeding or bruising.  -Labs today showed stable platelet count of 101,000.  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  However no hematological intervention would be warranted at current platelet levels.  If platelet count is below 30,000, then treatment with steroids could be considered.  However platelet count has remained stable for at least 20 years for him.  So I do not see the need for this currently.  -Continue monitoring with blood work every 6 months.

## 2024-04-30 NOTE — Assessment & Plan Note (Signed)
 Reports of dry mouth. No evidence of Sjogren's syndrome on previous testing. -Continue use of hard candy/lozenges to stimulate salivary glands. -Use non-alcoholic mouthwash, especially at night.

## 2024-05-01 ENCOUNTER — Other Ambulatory Visit: Payer: Self-pay | Admitting: Family Medicine

## 2024-05-04 ENCOUNTER — Other Ambulatory Visit: Payer: Self-pay | Admitting: Family Medicine

## 2024-05-06 ENCOUNTER — Encounter: Attending: Family Medicine | Admitting: Skilled Nursing Facility1

## 2024-05-06 ENCOUNTER — Encounter: Payer: Self-pay | Admitting: Skilled Nursing Facility1

## 2024-05-06 VITALS — Ht 67.0 in | Wt 153.0 lb

## 2024-05-06 DIAGNOSIS — E119 Type 2 diabetes mellitus without complications: Secondary | ICD-10-CM | POA: Diagnosis not present

## 2024-05-06 NOTE — Progress Notes (Unsigned)
 A1C 6.6  Diabetes medications: Glipizide  Metformin  Jardiance    Pt states he checks his blood sugars daily: fasting: 120-135 sometimes 150+ Pt states he will have his first meal about 3 hours after waking.  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   Diabetes Self-Management Education  Visit Type: First/Initial  Appt. Start Time: 10:45 Appt. End Time: 11:45  05/08/2024  Mr. Preston Pennington, identified by name and date of birth, is a 73 y.o. male with a diagnosis of Diabetes: Type 2.   ASSESSMENT  Height 5' 7 (1.702 m), weight 153 lb (69.4 kg). Body mass index is 23.96 kg/m.   Diabetes Self-Management Education - 05/08/24 1734       Visit Information   Visit Type First/Initial      Initial Visit   Diabetes Type Type 2    Are you taking your medications as prescribed? Yes      Health Coping   How would you rate your overall health? Good      Psychosocial Assessment   Patient Belief/Attitude about Diabetes Motivated to manage diabetes    What is the hardest part about your diabetes right now, causing you the most concern, or is the most worrisome to you about your diabetes?   Making healty food and beverage choices    Self-care barriers None    Self-management support Family    Patient Concerns Nutrition/Meal planning;Glycemic Control    Special Needs None    Preferred Learning Style Visual;Auditory    Learning Readiness Change in progress    How often do you need to have someone help you when you read instructions, pamphlets, or other written materials from your doctor or pharmacy? 1 - Never      Pre-Education Assessment   Patient understands the diabetes disease and treatment process. Needs Instruction    Patient understands incorporating nutritional management into lifestyle. Needs Instruction    Patient undertands incorporating physical activity into lifestyle. Needs Instruction    Patient understands using medications safely. Needs Instruction     Patient understands monitoring blood glucose, interpreting and using results Needs Instruction    Patient understands prevention, detection, and treatment of acute complications. Needs Instruction    Patient understands prevention, detection, and treatment of chronic complications. Needs Instruction    Patient understands how to develop strategies to address psychosocial issues. Needs Instruction    Patient understands how to develop strategies to promote health/change behavior. Needs Instruction      Complications   Last HgB A1C per patient/outside source 6.6 %    How often do you check your blood sugar? 0 times/day (not testing)    Have you had a dilated eye exam in the past 12 months? Yes    Have you had a dental exam in the past 12 months? Yes    Are you checking your feet? Yes    How many days per week are you checking your feet? 7      Activity / Exercise   Activity / Exercise Type Light (walking / raking leaves)    How many days per week do you exercise? 3    How many minutes per day do you exercise? 20    Total minutes per week of exercise 60      Patient Education   Previous Diabetes Education No    Disease Pathophysiology Definition of diabetes, type 1 and 2, and the diagnosis of diabetes;Factors that contribute to the development of diabetes  Healthy Eating Role of diet in the treatment of diabetes and the relationship between the three main macronutrients and blood glucose level;Food label reading, portion sizes and measuring food.;Plate Method;Reviewed blood glucose goals for pre and post meals and how to evaluate the patients' food intake on their blood glucose level.;Carbohydrate counting;Information on hints to eating out and maintain blood glucose control.;Meal options for control of blood glucose level and chronic complications.    Being Active Role of exercise on diabetes management, blood pressure control and cardiac health.;Identified with patient nutritional and/or  medication changes necessary with exercise.;Helped patient identify appropriate exercises in relation to his/her diabetes, diabetes complications and other health issue.    Medications Reviewed patients medication for diabetes, action, purpose, timing of dose and side effects.    Monitoring Taught/evaluated SMBG meter.;Purpose and frequency of SMBG.;Daily foot exams;Yearly dilated eye exam    Acute complications Taught prevention, symptoms, and  treatment of hypoglycemia - the 15 rule.;Discussed and identified patients' prevention, symptoms, and treatment of hyperglycemia.    Chronic complications Dental care;Retinopathy and reason for yearly dilated eye exams;Nephropathy, what it is, prevention of, the use of ACE, ARB's and early detection of through urine microalbumia.;Lipid levels, blood glucose control and heart disease;Assessed and discussed foot care and prevention of foot problems    Diabetes Stress and Support Role of stress on diabetes;Worked with patient to identify barriers to care and solutions      Individualized Goals (developed by patient)   Nutrition Follow meal plan discussed;General guidelines for healthy choices and portions discussed    Physical Activity Exercise 5-7 days per week;15 minutes per day;30 minutes per day    Medications take my medication as prescribed    Monitoring  Test my blood glucose as discussed    Problem Solving Eating Pattern;Sleep Pattern    Reducing Risk do foot checks daily;treat hypoglycemia with 15 grams of carbs if blood glucose less than 70mg /dL      Post-Education Assessment   Patient understands the diabetes disease and treatment process. Demonstrates understanding / competency    Patient understands incorporating nutritional management into lifestyle. Demonstrates understanding / competency    Patient undertands incorporating physical activity into lifestyle. Demonstrates understanding / competency    Patient understands using medications safely.  Demonstrates understanding / competency    Patient understands monitoring blood glucose, interpreting and using results Demonstrates understanding / competency    Patient understands prevention, detection, and treatment of acute complications. Demonstrates understanding / competency    Patient understands prevention, detection, and treatment of chronic complications. Demonstrates understanding / competency    Patient understands how to develop strategies to address psychosocial issues. Demonstrates understanding / competency    Patient understands how to develop strategies to promote health/change behavior. Demonstrates understanding / competency      Outcomes   Expected Outcomes Demonstrated interest in learning. Expect positive outcomes    Future DMSE PRN    Program Status Completed          Individualized Plan for Diabetes Self-Management Training:   Learning Objective:  Patient will have a greater understanding of diabetes self-management. Patient education plan is to attend individual and/or group sessions per assessed needs and concerns.    Expected Outcomes:  Demonstrated interest in learning. Expect positive outcomes  Education material provided: ADA - How to Thrive: A Guide for Your Journey with Diabetes, Meal plan card, My Plate, and Snack sheet  If problems or questions, patient to contact team via:  Phone and Email  Future  DSME appointment: PRN

## 2024-05-07 ENCOUNTER — Other Ambulatory Visit: Payer: Self-pay | Admitting: Family Medicine

## 2024-05-15 ENCOUNTER — Other Ambulatory Visit: Payer: Self-pay | Admitting: Family Medicine

## 2024-05-28 ENCOUNTER — Other Ambulatory Visit: Payer: Self-pay | Admitting: Family Medicine

## 2024-05-29 ENCOUNTER — Other Ambulatory Visit: Payer: Self-pay | Admitting: Family Medicine

## 2024-06-18 ENCOUNTER — Other Ambulatory Visit: Payer: Self-pay | Admitting: Family Medicine

## 2024-06-19 ENCOUNTER — Other Ambulatory Visit: Payer: Self-pay | Admitting: Family Medicine

## 2024-06-22 ENCOUNTER — Other Ambulatory Visit: Payer: Self-pay | Admitting: Family Medicine

## 2024-06-23 ENCOUNTER — Ambulatory Visit (INDEPENDENT_AMBULATORY_CARE_PROVIDER_SITE_OTHER): Admitting: Family Medicine

## 2024-06-23 ENCOUNTER — Encounter: Payer: Self-pay | Admitting: Family Medicine

## 2024-06-23 VITALS — BP 112/78 | HR 78 | Temp 98.1°F | Ht 67.0 in | Wt 154.8 lb

## 2024-06-23 DIAGNOSIS — E1140 Type 2 diabetes mellitus with diabetic neuropathy, unspecified: Secondary | ICD-10-CM

## 2024-06-23 DIAGNOSIS — E785 Hyperlipidemia, unspecified: Secondary | ICD-10-CM | POA: Diagnosis not present

## 2024-06-23 DIAGNOSIS — E1169 Type 2 diabetes mellitus with other specified complication: Secondary | ICD-10-CM

## 2024-06-23 DIAGNOSIS — I10 Essential (primary) hypertension: Secondary | ICD-10-CM

## 2024-06-23 DIAGNOSIS — Z7984 Long term (current) use of oral hypoglycemic drugs: Secondary | ICD-10-CM | POA: Diagnosis not present

## 2024-06-23 DIAGNOSIS — E119 Type 2 diabetes mellitus without complications: Secondary | ICD-10-CM

## 2024-06-23 LAB — POCT GLYCOSYLATED HEMOGLOBIN (HGB A1C): Hemoglobin A1C: 6.3 % — AB (ref 4.0–5.6)

## 2024-06-23 LAB — MICROALBUMIN / CREATININE URINE RATIO
Creatinine,U: 48.1 mg/dL
Microalb Creat Ratio: UNDETERMINED mg/g (ref 0.0–30.0)
Microalb, Ur: <0.7 mg/dL

## 2024-06-23 MED ORDER — ATORVASTATIN CALCIUM 10 MG PO TABS
10.0000 mg | ORAL_TABLET | ORAL | 3 refills | Status: AC
Start: 2024-06-23 — End: ?

## 2024-06-23 NOTE — Patient Instructions (Addendum)
 Please stop by lab before you go- urine only If you have mychart- we will send your results within 3 business days of us  receiving them.  If you do not have mychart- we will call you about results within 5 business days of us  receiving them.  *please also note that you will see labs on mychart as soon as they post. I will later go in and write notes on them- will say notes from Dr. Katrinka Finder you are doing well!   Recommended follow up: Return in about 14 weeks (around 09/29/2024) for followup or sooner if needed.Schedule b4 you leave.

## 2024-06-23 NOTE — Progress Notes (Signed)
 Phone [phone] In person visit   Subjective:   Preston Pennington is a 73 y.o. 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 03/31/2019    Priority: Medium    Hyperlipidemia associated with type 2 diabetes mellitus (HCC) 07/09/2018    Priority: Medium    Left cervical radiculopathy 09/24/2017    Priority: Medium    Hx of colonic polyps 11/06/2016    Priority: Medium    Chronic ankle pain 06/14/2016    Priority: Medium    Thrombocytopenia (HCC) 11/29/2015    Priority: Medium    Gout 04/30/2015    Priority: Medium    Essential hypertension 04/09/2013    Priority: Medium    BPH (benign prostatic hyperplasia) 04/09/2013    Priority: Medium    Leg length difference, acquired 06/20/2019    Priority: Low   Arthritis of first metatarsophalangeal (MTP) joint of right foot 05/30/2019    Priority: Low   Sesamoiditis 11/08/2018    Priority: Low   Osteoarthritis of spine with radiculopathy, cervical region 11/14/2017    Priority: Low   Erectile dysfunction 02/04/2015    Priority: Low   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     acetaminophen  (TYLENOL ) 500 MG tablet Take 1,000 mg by mouth every 6 (six) hours as needed  for moderate pain.      allopurinol  (ZYLOPRIM ) 100 MG tablet Take 1 tablet by mouth once daily 90 tablet 0   amLODipine  (NORVASC ) 5 MG tablet Take 1 tablet by mouth once daily 90 tablet 0   atorvastatin  (LIPITOR) 10 MG tablet Take 1 tablet (10 mg total) by mouth once a week. 13 tablet 3   azelastine  (ASTELIN ) 0.1 % nasal spray Place 2 sprays into both nostrils 2 (two) times daily. For runny nose. 30 mL 12   colchicine  0.6 MG tablet TAKE 2 TABLETS BY MOUTH AT THE FIRST SIGN OF GOUT THEN TAKE 1 TABLET 2 HOURS LATER on day 1 (max 3 tablets), then take daily UNTIL FLARE IS RESOLVED  60 tablet 3   dorzolamide-timolol (COSOPT) 2-0.5 % ophthalmic solution Place 1 drop into both eyes 2 (two) times daily.     feeding supplement, ENSURE ENLIVE, (ENSURE ENLIVE) LIQD Take 1 Bottle by mouth daily.     gabapentin  (NEURONTIN ) 300 MG capsule TAKE 1 CAPSULE BY MOUTH IN THE MORNING AND 1 AT BEDTIME 60 capsule 0   glipiZIDE  (GLUCOTROL ) 5 MG tablet TAKE 1 TABLET BY MOUTH TWICE DAILY BEFORE MEAL(S) 180 tablet 0   JARDIANCE  10 MG TABS tablet TAKE 1 TABLET BY MOUTH ONCE DAILY BEFORE BREAKFAST 90 tablet 0   latanoprost (XALATAN) 0.005 % ophthalmic solution Place 1 drop into both eyes at bedtime.      lisinopril  (  ZESTRIL ) 20 MG tablet Take 1 tablet by mouth once daily 90 tablet 0   metFORMIN  (GLUCOPHAGE ) 1000 MG tablet TAKE 1 TABLET BY MOUTH TWICE DAILY WITH MEALS 60 tablet 0   Multiple Vitamin (MULTIVITAMIN ADULT PO) Take by mouth.     Polyethyl Glycol-Propyl Glycol (SYSTANE) 0.4-0.3 % SOLN Apply to eye. For dry eyes 3 x a day     Tetrahyd-Glyc-Hypro-PEG-ZnSulf (VISINE TOTALITY MULTI-SYMPTOM OP) Apply 1 drop to eye in the morning, at noon, and at bedtime.     No current facility-administered medications for this visit.     Objective:  BP 112/78 (BP Location: Left Arm, Patient Position: Sitting, Cuff Size: Normal)   Pulse 78   Temp 98.1 F (36.7 C) (Temporal)   Ht 5' 7 (1.702 m)   Wt 154 lb 12.8 oz (70.2 kg)    SpO2 94%   BMI 24.25 kg/m  Gen: NAD, resting comfortably CV: RRR no murmurs rubs or gallops Lungs: CTAB no crackles, wheeze, rhonchi Ext: no edema Skin: warm, dry  Diabetic foot exam was performed with the following findings:   No deformities, ulcerations, or other skin breakdown Normal sensation of 10g monofilament Intact posterior tibialis and dorsalis pedis pulses        Assessment and Plan   # Diabetes with neuropathy S: compliant with on Metformin  1000 mg BID and Glipizide  5 mg twice daily (no low blood sugars) and jardiance  10 mg   -gabapentin  started for cervical radiculopathy- he prefers to continue as seems to help with leg pain (possible neuropathy) CBGs- 166 this morning, 130s most of time, lowest 112 Exercise and diet- still feels could improve exercise Lab Results  Component Value Date   HGBA1C 6.3 (A) 06/23/2024   HGBA1C 6.6 (H) 11/16/2023   HGBA1C 6.9 (H) 05/17/2023  A/P: a1c at goal on Point of Care (POC) at 6.3- continue current medications as doing well and no lows with good control  #hypertension S: compliant with Amlodipine  5 mg once daily and lisinopril  20 mg daily.  BP Readings from Last 3 Encounters:  06/23/24 112/78  04/30/24 128/76  04/18/24 115/69  A/P: well controlled continue current medications   #hyperlipidemia S: Medication: None as of 03/01/2024 but may restart- he reported today significant improvement in pain with coming off the statin  -Prior atorvastatin  10 mg 3 times weekly.  In early 2025 recommended reducing to once weekly with mild LFT bump but he did not get this message and eventually had worsening myalgias and we stopped the medication.   Lab Results  Component Value Date   CHOL 137 11/16/2023   HDL 34.10 (L) 11/16/2023   LDLCALC 84 11/16/2023   LDLDIRECT 68.0 10/14/2021   TRIG 97.0 11/16/2023   CHOLHDL 4 11/16/2023   A/P: cholesterol likely worse off of all cholesterol medicine but had leg aches on three times a week- so we  will trial once a week instead and he will update me if symptoms recur/worsen- atorvastatin  10 mg once weekly planned  # Gout S: compliant with Allopurinol  100 mg daily and Colchicine  0.6 mg prn.   Lab Results  Component Value Date   LABURIC 5.4 04/11/2022   A/P: no recent flares- check uric acid only if starts to have flares again. - has beensometime   Recommended follow up: Return in about 14 weeks (around 09/29/2024) for followup or sooner if needed.Schedule b4 you leave. Future Appointments  Date Time Provider Department Center  11/07/2024 11:15 AM [facility] LAB [facility] None  11/07/2024  11:45 AM Pasam, Avinash, MD [facility] None  04/23/2025  8:00 AM [facility] ANNUAL WELLNESS VISIT 1 [facility]    Lab/Order associations:   ICD-10-CM   1. Controlled type 2 diabetes mellitus without complication, without long-term current use of insulin  (HCC)  E11.9 POCT HgB A1C    2. Hyperlipidemia associated with type 2 diabetes mellitus (HCC)  E11.69 Urine Albumin/Creatinine with ratio (send out) [LAB689]   E78.5     3. Type 2 diabetes mellitus with diabetic neuropathy, without long-term current use of insulin  (HCC)  E11.40     4. Essential hypertension  I10       Meds ordered this encounter  Medications   atorvastatin  (LIPITOR) 10 MG tablet    Sig: Take 1 tablet (10 mg total) by mouth once a week.    Dispense:  13 tablet    Refill:  3    Return precautions advised.  Garnette Lukes, MD

## 2024-06-24 ENCOUNTER — Ambulatory Visit: Payer: Self-pay | Admitting: Family Medicine

## 2024-07-14 DIAGNOSIS — H02051 Trichiasis without entropian right upper eyelid: Secondary | ICD-10-CM | POA: Diagnosis not present

## 2024-07-14 DIAGNOSIS — H401131 Primary open-angle glaucoma, bilateral, mild stage: Secondary | ICD-10-CM | POA: Diagnosis not present

## 2024-07-21 ENCOUNTER — Ambulatory Visit: Payer: Self-pay

## 2024-07-21 ENCOUNTER — Ambulatory Visit: Payer: Self-pay | Admitting: Family Medicine

## 2024-07-21 ENCOUNTER — Encounter: Payer: Self-pay | Admitting: Family Medicine

## 2024-07-21 ENCOUNTER — Ambulatory Visit (INDEPENDENT_AMBULATORY_CARE_PROVIDER_SITE_OTHER)

## 2024-07-21 ENCOUNTER — Ambulatory Visit: Admitting: Family Medicine

## 2024-07-21 ENCOUNTER — Other Ambulatory Visit: Payer: Self-pay | Admitting: Family Medicine

## 2024-07-21 VITALS — BP 134/77 | HR 76 | Temp 98.1°F | Resp 16 | Ht 67.0 in | Wt 154.4 lb

## 2024-07-21 DIAGNOSIS — E119 Type 2 diabetes mellitus without complications: Secondary | ICD-10-CM

## 2024-07-21 DIAGNOSIS — M545 Low back pain, unspecified: Secondary | ICD-10-CM

## 2024-07-21 LAB — POC URINALSYSI DIPSTICK (AUTOMATED)
Bilirubin, UA: NEGATIVE
Blood, UA: NEGATIVE
Glucose, UA: POSITIVE — AB
Ketones, UA: NEGATIVE
Leukocytes, UA: NEGATIVE
Nitrite, UA: NEGATIVE
Protein, UA: NEGATIVE
Spec Grav, UA: 1.01 (ref 1.010–1.025)
Urobilinogen, UA: 0.2 U/dL
pH, UA: 5.5 (ref 5.0–8.0)

## 2024-07-21 NOTE — Telephone Encounter (Signed)
 FYI Only or Action Required?: FYI only for provider.  Patient was last seen in primary care on 06/23/2024 by Katrinka Garnette KIDD, MD.  Called Nurse Triage reporting Back Pain.  Symptoms began several days ago.  Interventions attempted: OTC medications: Ibuprofen .  Symptoms are: unchanged.  Triage Disposition: See PCP When Office is Open (Within 3 Days)  Patient/caregiver understands and will follow disposition?: Yes     Copied from CRM [id number]. Topic: Clinical - Red Word Triage >> Jul 21, 2024 11:18 AM Franky GRADE wrote: Red Word that prompted transfer to Nurse Triage: Patient is experiencing severe lower back pain that started over the weekend. Patient did not suffer an injury or fall believes he could have developed because of how he was seated. The pain is unbearable and he is unable to sit.     Reason for Disposition  [1] MODERATE back pain (e.g., interferes with normal activities) AND [2] present > 3 days  Answer Assessment - Initial Assessment Questions 1. ONSET: When did the pain begin? (e.g., minutes, hours, days)     3 days ago  2. LOCATION: Where does it hurt? (upper, mid or lower back)     Right lower back  3. SEVERITY: How bad is the pain?  (e.g., Scale 1-10; mild, moderate, or severe)     Moderate to severe  4. PATTERN: Is the pain constant? (e.g., yes, no; constant, intermittent)      Constant but worse when sitting  5. RADIATION: Does the pain shoot into your legs or somewhere else?     No 6. CAUSE:  What do you think is causing the back pain?      Unsure 7. BACK OVERUSE:  Any recent lifting of heavy objects, strenuous work or exercise?     No 8. MEDICINES: What have you taken so far for the pain? (e.g., nothing, acetaminophen , NSAIDS)     Ibuprofen  9. NEUROLOGIC SYMPTOMS: Do you have any weakness, numbness, or problems with bowel/bladder control?     No 10. OTHER SYMPTOMS: Do you have any other symptoms? (e.g., fever, abdomen pain, burning  with urination, blood in urine)       No  Protocols used: Back Pain-A-AH

## 2024-07-21 NOTE — Progress Notes (Signed)
 Subjective:     Patient ID: Preston Pennington, male    DOB: 11-Jul-1951, 73 y.o.   MRN: [id number]  Chief Complaint  Patient presents with   Back Pain    Right-sided lower back pain x 3 days     HPI Discussed the use of AI scribe software for clinical note transcription with the patient, who gave verbal consent to proceed.  History of Present Illness Preston Pennington is a 73 year old male who presents with right-sided lower back pain.  He has been experiencing a sudden onset of right-sided lower back pain for three days. The pain is constant and worsens with movement, especially when lifting his right leg. He attempted over-the-counter pain relief without success. No recent falls or injuries, but he recalls a fall one month ago without immediate pain. No radiation of pain, numbness, tingling, or weakness.  He has a history of a right leg fracture leading to a leg length discrepancy, requiring a lift in his right shoe. He denies urinary issues and has a history of prostatic hypertrophy, with his prostate removed in 2018. He is currently taking ibuprofen  and gabapentin  300 mg twice daily for pain management. No allergies reported and no recent physical therapy or similar issues in the past.  During the review of systems, he denies numbness, tingling, weakness, urinary issues, or rash. His recent blood sugar levels are stable, with an A1c of 6.3.    There are no preventive care reminders to display for this patient.  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   Diabetes mellitus without complication (HCC)    Difficult intubation    mouth was not wide enough   Dry eyes    History of adenomatous polyp of colon 11/06/2016   10/2016 adenoma x3 - recall colon 10/2019   Hyperlipidemia    Hypertension    Neuromuscular disorder (HCC)    past hx tingling left arm    Prostate enlargement 2010   See's Urologist     Past Surgical History:   Procedure Laterality Date   COLONOSCOPY     COLONOSCOPY WITH PROPOFOL  N/A 11/02/2016   Procedure: COLONOSCOPY WITH PROPOFOL ;  Surgeon: Lupita FORBES Commander, MD;  Location: [facility];  Service: Endoscopy;  Laterality: N/A;   COLONOSCOPY WITH PROPOFOL  N/A 05/18/2020   Procedure: COLONOSCOPY WITH PROPOFOL ;  Surgeon: Commander Lupita FORBES, MD;  Location: [facility];  Service: Endoscopy;  Laterality: N/A;   LEG SURGERY Right 1997   accident related   XI ROBOTIC ASSISTED SIMPLE PROSTATECTOMY N/A 10/05/2017   Procedure: XI ROBOTIC ASSISTED SIMPLE PROSTATECTOMY WITH UMBILICAL HERNIA REPAIR;  Surgeon: Sherrilee Belvie CROME, MD;  Location: [facility];  Service: Urology;  Laterality: N/A;     Current Outpatient Medications:    ACCU-CHEK GUIDE TEST test strip, USE AS DIRECTED 4 TIMES DAILY, Disp: 100 each, Rfl: 0   Accu-Chek Softclix Lancets lancets, 4 (four) times daily., Disp: , Rfl:    acetaminophen  (TYLENOL ) 500 MG tablet, Take 1,000 mg by mouth every 6 (six) hours as needed for moderate pain. , Disp: , Rfl:    allopurinol  (ZYLOPRIM ) 100 MG tablet, Take 1 tablet by mouth once daily, Disp: 90 tablet, Rfl: 0   amLODipine  (NORVASC ) 5 MG tablet, Take 1 tablet by mouth once daily, Disp: 90 tablet, Rfl: 0   atorvastatin  (LIPITOR) 10 MG tablet, Take 1 tablet (10 mg total) by mouth once a week., Disp: 13 tablet, Rfl: 3  azelastine  (ASTELIN ) 0.1 % nasal spray, Place 2 sprays into both nostrils 2 (two) times daily. For runny nose., Disp: 30 mL, Rfl: 12   colchicine  0.6 MG tablet, TAKE 2 TABLETS BY MOUTH AT THE FIRST SIGN OF GOUT THEN TAKE 1 TABLET 2 HOURS LATER on day 1 (max 3 tablets), then take daily UNTIL FLARE IS RESOLVED , Disp: 60 tablet, Rfl: 3   dorzolamide-timolol (COSOPT) 2-0.5 % ophthalmic solution, Place 1 drop into both eyes 2 (two) times daily., Disp: , Rfl:    feeding supplement, ENSURE ENLIVE, (ENSURE ENLIVE) LIQD, Take 1 Bottle by mouth daily., Disp: , Rfl:    gabapentin  (NEURONTIN ) 300 MG capsule,  TAKE 1 CAPSULE BY MOUTH IN THE MORNING AND 1 AT BEDTIME, Disp: 60 capsule, Rfl: 0   glipiZIDE  (GLUCOTROL ) 5 MG tablet, TAKE 1 TABLET BY MOUTH TWICE DAILY BEFORE MEAL(S), Disp: 180 tablet, Rfl: 0   JARDIANCE  10 MG TABS tablet, TAKE 1 TABLET BY MOUTH ONCE DAILY BEFORE BREAKFAST, Disp: 90 tablet, Rfl: 0   latanoprost (XALATAN) 0.005 % ophthalmic solution, Place 1 drop into both eyes at bedtime. , Disp: , Rfl:    lisinopril  (ZESTRIL ) 20 MG tablet, Take 1 tablet by mouth once daily, Disp: 90 tablet, Rfl: 0   metFORMIN  (GLUCOPHAGE ) 1000 MG tablet, TAKE 1 TABLET BY MOUTH TWICE DAILY WITH MEALS, Disp: 60 tablet, Rfl: 0   Multiple Vitamin (MULTIVITAMIN ADULT PO), Take by mouth., Disp: , Rfl:    Polyethyl Glycol-Propyl Glycol (SYSTANE) 0.4-0.3 % SOLN, Apply to eye. For dry eyes 3 x a day, Disp: , Rfl:    Tetrahyd-Glyc-Hypro-PEG-ZnSulf (VISINE TOTALITY MULTI-SYMPTOM OP), Apply 1 drop to eye in the morning, at noon, and at bedtime., Disp: , Rfl:   No Known Allergies ROS neg/noncontributory except as noted HPI/below      Objective:     BP 134/77   Pulse 76   Temp 98.1 F (36.7 C) (Temporal)   Resp 16   Ht 5' 7 (1.702 m)   Wt 154 lb 6 oz (70 kg)   SpO2 98%   BMI 24.18 kg/m  Wt Readings from Last 3 Encounters:  07/21/24 154 lb 6 oz (70 kg)  06/23/24 154 lb 12.8 oz (70.2 kg)  05/06/24 153 lb (69.4 kg)    Physical Exam   Gen: WDWN NAD HEENT: NCAT, conjunctiva not injected, sclera nonicteric ABDOMEN:  BS+, soft, NTND, No HSM, no masses EXT:  no edema MSK: no gross abnormalities. Lift R foot NEURO: A&O x3.  CN II-XII intact.  PSYCH: normal mood. Good eye contact Back:  can stand on heels/toes/1 leg but pain when stand on R leg.  No TTP spine. Some R SI tenderness. No rash.  MS 4+/5 BLE.  DTR 2+ BLE knees..  SLR neg +R.  dec ROM - flex to 45 degrees. No TTP hips but some pain w/rotation R hip  Reviewed labs  Results for orders placed or performed in visit on 07/21/24  POCT Urinalysis  Dipstick (Automated)   Collection Time: 07/21/24  4:04 PM  Result Value Ref Range   Color, UA YELLOW    Clarity, UA CLEAR    Glucose, UA Positive (A) Negative   Bilirubin, UA NEG    Ketones, UA NEG    Spec Grav, UA 1.010 1.010 - 1.025   Blood, UA NEG    pH, UA 5.5 5.0 - 8.0   Protein, UA Negative Negative   Urobilinogen, UA 0.2 0.2 or 1.0 E.U./dL   Nitrite, UA  NEG    Leukocytes, UA Negative Negative        Assessment & Plan:  Low back pain, unspecified back pain laterality, unspecified chronicity, unspecified whether sciatica present  Right-sided low back pain without sciatica, unspecified chronicity -     POCT Urinalysis Dipstick (Automated) -     DG Lumbar Spine 2-3 Views; Future  Assessment and Plan Assessment & Plan Vertebrogenic low back pain, right side   He presents with acute right-sided low back pain for three days, following a fall one month ago. The pain is constant and worsens with movement, particularly when lifting the right leg. There is no radiation, numbness, tingling, or weakness in the leg. Physical examination reveals tenderness and tightness in the right lower back muscles. . Order an x-ray of the lumbar spine to check for fractures or abnormalities. Prescribe prednisone  to reduce inflammation and pain, monitoring for potential increases in blood sugar levels.. Consider referral to physical therapy if symptoms do not improve.    Return if symptoms worsen or fail to improve.  Jenkins CHRISTELLA Carrel, MD

## 2024-07-21 NOTE — Progress Notes (Signed)
 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   Let us  know if worsening/not improving

## 2024-07-21 NOTE — Telephone Encounter (Signed)
 Noted

## 2024-07-21 NOTE — Patient Instructions (Signed)
 Medrol  dose pack.   NO ibuprofen  while on the meds.  Ok to take tylenol     heat/ice ok and icy hot/bio freeze.  Watch for rash.

## 2024-07-23 ENCOUNTER — Other Ambulatory Visit: Payer: Self-pay | Admitting: Family Medicine

## 2024-07-23 ENCOUNTER — Ambulatory Visit: Payer: Self-pay

## 2024-07-23 ENCOUNTER — Telehealth: Payer: Self-pay | Admitting: Family Medicine

## 2024-07-23 ENCOUNTER — Telehealth: Payer: Self-pay

## 2024-07-23 MED ORDER — METHYLPREDNISOLONE 4 MG PO TBPK: ORAL_TABLET | ORAL | 0 refills | Status: DC

## 2024-07-23 NOTE — Telephone Encounter (Signed)
 Message sent to Dr. Wendolyn this morning.

## 2024-07-23 NOTE — Telephone Encounter (Signed)
 Copied from CRM #[id number]. Topic: Clinical - Medication Question >> Jul 22, 2024  5:24 PM Chiquita SQUIBB wrote: Reason for CRM: Patient is calling in requesting a call back from Dr. Enos nurse regarding his medication from his visit on 09/22 >> Jul 23, 2024 10:41 AM Rosina D wrote: Patient would like a call back in regards to a previous message on today for medication [phone] >> Jul 23, 2024 10:28 AM Drema MATSU wrote: Heath states patient is at pharmacy and is upset because Medrol  dose pack (4 o r 8) hasn't been sent.

## 2024-07-23 NOTE — Telephone Encounter (Signed)
 No triage: Heath from [facility] pharmacy called to say pt was very upset his Medrol  dose pack was not called in from 9/22 appt.  RN notified CAL.       Copied from CRM #[id number]. Topic: Clinical - Medication Question >> Jul 22, 2024  5:24 PM Chiquita SQUIBB wrote: Reason for CRM: Patient is calling in requesting a call back from Dr. Enos nurse regarding his medication from his visit on 09/22 >> Jul 23, 2024 10:28 AM Drema MATSU wrote: Heath states patient is at pharmacy and is upset because Medrol  dose pack (4 o r 8) hasn't been sent.

## 2024-07-23 NOTE — Telephone Encounter (Signed)
 Please see patient message and advise.    Copied from CRM #[id number]. Topic: Clinical - Medication Question >> Jul 22, 2024  5:24 PM Chiquita SQUIBB wrote: Reason for CRM: Patient is calling in requesting a call back from Dr. Enos nurse regarding his medication from his visit on 09/22

## 2024-07-24 NOTE — Telephone Encounter (Signed)
 I returned pt's call, Pt verbalized understanding.

## 2024-07-30 ENCOUNTER — Other Ambulatory Visit: Payer: Self-pay | Admitting: Family Medicine

## 2024-08-02 ENCOUNTER — Other Ambulatory Visit: Payer: Self-pay | Admitting: Family Medicine

## 2024-08-09 ENCOUNTER — Other Ambulatory Visit: Payer: Self-pay | Admitting: Family Medicine

## 2024-08-14 ENCOUNTER — Other Ambulatory Visit: Payer: Self-pay | Admitting: Family Medicine

## 2024-08-22 ENCOUNTER — Other Ambulatory Visit: Payer: Self-pay | Admitting: Family Medicine

## 2024-09-13 ENCOUNTER — Other Ambulatory Visit: Payer: Self-pay | Admitting: Family Medicine

## 2024-09-17 ENCOUNTER — Other Ambulatory Visit: Payer: Self-pay | Admitting: Family Medicine

## 2024-09-29 ENCOUNTER — Other Ambulatory Visit: Payer: Self-pay | Admitting: Family Medicine

## 2024-10-09 ENCOUNTER — Ambulatory Visit: Admitting: Family Medicine

## 2024-10-15 ENCOUNTER — Ambulatory Visit (INDEPENDENT_AMBULATORY_CARE_PROVIDER_SITE_OTHER): Admitting: Family Medicine

## 2024-10-15 ENCOUNTER — Encounter: Payer: Self-pay | Admitting: Family Medicine

## 2024-10-15 VITALS — BP 132/78 | HR 70 | Temp 98.1°F | Ht 67.0 in | Wt 154.8 lb

## 2024-10-15 DIAGNOSIS — I10 Essential (primary) hypertension: Secondary | ICD-10-CM

## 2024-10-15 DIAGNOSIS — E785 Hyperlipidemia, unspecified: Secondary | ICD-10-CM

## 2024-10-15 DIAGNOSIS — E1140 Type 2 diabetes mellitus with diabetic neuropathy, unspecified: Secondary | ICD-10-CM

## 2024-10-15 DIAGNOSIS — Z125 Encounter for screening for malignant neoplasm of prostate: Secondary | ICD-10-CM

## 2024-10-15 DIAGNOSIS — Z7984 Long term (current) use of oral hypoglycemic drugs: Secondary | ICD-10-CM

## 2024-10-15 DIAGNOSIS — E1169 Type 2 diabetes mellitus with other specified complication: Secondary | ICD-10-CM

## 2024-10-15 LAB — CBC WITH DIFFERENTIAL/PLATELET
Basophils Absolute: 0 K/uL (ref 0.0–0.1)
Basophils Relative: 0.6 % (ref 0.0–3.0)
Eosinophils Absolute: 0.2 K/uL (ref 0.0–0.7)
Eosinophils Relative: 5.3 % — ABNORMAL HIGH (ref 0.0–5.0)
HCT: 45.9 % (ref 39.0–52.0)
Hemoglobin: 14.6 g/dL (ref 13.0–17.0)
Lymphocytes Relative: 47.1 % — ABNORMAL HIGH (ref 12.0–46.0)
Lymphs Abs: 1.9 K/uL (ref 0.7–4.0)
MCHC: 31.8 g/dL (ref 30.0–36.0)
MCV: 82.4 fl (ref 78.0–100.0)
Monocytes Absolute: 0.3 K/uL (ref 0.1–1.0)
Monocytes Relative: 8.4 % (ref 3.0–12.0)
Neutro Abs: 1.5 K/uL (ref 1.4–7.7)
Neutrophils Relative %: 38.6 % — ABNORMAL LOW (ref 43.0–77.0)
Platelets: 90 K/uL — ABNORMAL LOW (ref 150.0–400.0)
RBC: 5.57 Mil/uL (ref 4.22–5.81)
RDW: 14 % (ref 11.5–15.5)
WBC: 4 K/uL (ref 4.0–10.5)

## 2024-10-15 LAB — COMPREHENSIVE METABOLIC PANEL WITH GFR
ALT: 97 U/L — ABNORMAL HIGH (ref 3–53)
AST: 53 U/L — ABNORMAL HIGH (ref 5–37)
Albumin: 4.3 g/dL (ref 3.5–5.2)
Alkaline Phosphatase: 63 U/L (ref 39–117)
BUN: 23 mg/dL (ref 6–23)
CO2: 28 meq/L (ref 19–32)
Calcium: 9.9 mg/dL (ref 8.4–10.5)
Chloride: 106 meq/L (ref 96–112)
Creatinine, Ser: 1.08 mg/dL (ref 0.40–1.50)
GFR: 67.74 mL/min (ref >60.00)
Glucose, Bld: 125 mg/dL — ABNORMAL HIGH (ref 70–99)
Potassium: 4.3 meq/L (ref 3.5–5.1)
Sodium: 141 meq/L (ref 135–145)
Total Bilirubin: 0.5 mg/dL (ref 0.2–1.2)
Total Protein: 6.7 g/dL (ref 6.0–8.3)

## 2024-10-15 LAB — PSA, MEDICARE: PSA: 1.92 ng/mL (ref 0.10–4.00)

## 2024-10-15 LAB — LDL CHOLESTEROL, DIRECT: Direct LDL: 96 mg/dL

## 2024-10-15 LAB — HEMOGLOBIN A1C: Hgb A1c MFr Bld: 6.7 % — ABNORMAL HIGH (ref 4.6–6.5)

## 2024-10-15 NOTE — Patient Instructions (Addendum)
 Please stop by lab before you go If you have mychart- we will send your results within 3 business days of us  receiving them.  If you do not have mychart- we will call you about results within 5 business days of us  receiving them.  *please also note that you will see labs on mychart as soon as they post. I will later go in and write notes on them- will say notes from Dr. Katrinka   No changes today unless labs lead us  to make changes  Recommended follow up: Return in about 4 months (around 02/13/2025) for physical or sooner if needed.Schedule b4 you leave.

## 2024-10-15 NOTE — Progress Notes (Signed)
 Phone [phone] In person visit   Subjective:   Preston Pennington is a 73 y.o. year old very pleasant male patient who presents for/with See problem oriented charting Chief Complaint  Patient presents with   Diabetes    Requesting diabetic eye exam from Dr. 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 03/31/2019    Priority: Medium    Hyperlipidemia associated with type 2 diabetes mellitus (HCC) 07/09/2018    Priority: Medium    Left cervical radiculopathy 09/24/2017    Priority: Medium    Hx of colonic polyps 11/06/2016    Priority: Medium    Chronic ankle pain 06/14/2016    Priority: Medium    Thrombocytopenia 11/29/2015    Priority: Medium    Gout 04/30/2015    Priority: Medium    Essential hypertension 04/09/2013    Priority: Medium    BPH (benign prostatic hyperplasia) 04/09/2013    Priority: Medium    Leg length difference, acquired 06/20/2019    Priority: Low   Arthritis of first metatarsophalangeal (MTP) joint of right foot 05/30/2019    Priority: Low   Sesamoiditis 11/08/2018    Priority: Low   Osteoarthritis of spine with radiculopathy, cervical region 11/14/2017    Priority: Low   Erectile dysfunction 02/04/2015    Priority: Low   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     acetaminophen   (TYLENOL ) 500 MG tablet Take 1,000 mg by mouth every 6 (six) hours as needed for moderate pain.      allopurinol  (ZYLOPRIM ) 100 MG tablet Take 1 tablet by mouth once daily 90 tablet 0   amLODipine  (NORVASC ) 5 MG tablet Take 1 tablet by mouth once daily 90 tablet 0   atorvastatin  (LIPITOR) 10 MG tablet Take 1 tablet (10 mg total) by mouth once a week. 13 tablet 3   azelastine  (ASTELIN ) 0.1 % nasal spray Place 2 sprays into both nostrils 2 (two) times daily. For runny nose. 30 mL 12   colchicine  0.6 MG tablet TAKE 2 TABLETS BY MOUTH AT THE FIRST SIGN OF GOUT THEN TAKE 1 TABLET 2 HOURS LATER on day 1 (max 3 tablets), then take daily UNTIL FLARE IS RESOLVED  60 tablet 3   dorzolamide-timolol (COSOPT) 2-0.5 % ophthalmic solution Place 1 drop into both eyes 2 (two) times daily.     feeding supplement, ENSURE ENLIVE, (ENSURE ENLIVE) LIQD Take 1 Bottle by mouth daily.     gabapentin  (NEURONTIN ) 300 MG capsule TAKE 1 CAPSULE BY MOUTH IN THE MORNING AND 1 AT BEDTIME 60 capsule 5   glipiZIDE  (GLUCOTROL ) 5 MG tablet TAKE 1 TABLET BY MOUTH TWICE DAILY BEFORE MEAL(S) 180 tablet 0   JARDIANCE  10 MG TABS tablet TAKE 1 TABLET BY MOUTH ONCE DAILY BEFORE BREAKFAST 90 tablet 0   latanoprost (XALATAN)  0.005 % ophthalmic solution Place 1 drop into both eyes at bedtime.      lisinopril  (ZESTRIL ) 20 MG tablet Take 1 tablet by mouth once daily 90 tablet 0   metFORMIN  (GLUCOPHAGE ) 1000 MG tablet TAKE 1 TABLET BY MOUTH TWICE DAILY WITH MEALS . APPOINTMENT REQUIRED FOR FUTURE REFILLS 60 tablet 0   Multiple Vitamin (MULTIVITAMIN ADULT PO) Take by mouth.     Polyethyl Glycol-Propyl Glycol (SYSTANE) 0.4-0.3 % SOLN Apply to eye. For dry eyes 3 x a day     Tetrahyd-Glyc-Hypro-PEG-ZnSulf (VISINE TOTALITY MULTI-SYMPTOM OP) Apply 1 drop to eye in the morning, at noon, and at bedtime.     No current facility-administered medications for this visit.     Objective:  BP 132/78 (BP Location: Left Arm, Patient Position: Sitting, Cuff  Size: Normal)   Pulse 70   Temp 98.1 F (36.7 C) (Temporal)   Ht 5' 7 (1.702 m)   Wt 154 lb 12.8 oz (70.2 kg)   SpO2 97%   BMI 24.25 kg/m  Gen: NAD, resting comfortably CV: RRR no murmurs rubs or gallops Lungs: CTAB no crackles, wheeze, rhonchi Abdomen: soft/nontender/nondistended/normal bowel sounds. No rebound or guarding.  Ext: trace edema on the right, minimal on left- baseline Skin: warm, dry     Assessment and Plan    # Diabetes S: compliant with on Metformin  1000 mg BID and Glipizide  5 mg twice daily and jardiance  10 mg   -gabapentin  started for cervical radiculopathy- he prefers to continue as seems to help with leg pain ( neuropathy)- reasonable control. With urination feels slight trigger interestingly  CBGs- often 150's in am Exercise and diet- reports not exercising but encouraged Lab Results  Component Value Date   HGBA1C 6.3 (A) 06/23/2024   HGBA1C 6.6 (H) 11/16/2023   HGBA1C 6.9 (H) 05/17/2023   A/P: diabetes -hopefully stable- update a1c  today. Continue current meds for now . Encouraged of the add exercise Neuropathy- reasonable control continue current medications   #hypertension S: compliant with Amlodipine  5 mg once daily and lisinopril  20 mg daily.  BP Readings from Last 3 Encounters:  10/15/24 132/78  07/21/24 134/77  06/23/24 112/78  A/P: stable- continue current medicines   #hyperlipidemia S: Medication:atorvastatin  10 mg weekly without myalgias- had previously with LFT elevation Lab Results  Component Value Date   CHOL 137 11/16/2023   HDL 34.10 (L) 11/16/2023   LDLCALC 84 11/16/2023   LDLDIRECT 68.0 10/14/2021   TRIG 97.0 11/16/2023   CHOLHDL 4 11/16/2023   A/P: hopefully stable though suspect may be slightly worse as we had to go down on dose for myalgias and LFts- update LDL and LFT  # Gout S: compliant with Allopurinol  100 mg daily and Colchicine  0.6 mg prn.   A/P: no recent flares- doing well   #  Thrombocytopenia/leukopenia S:platelets generally around 100. he states has been like this for over 2 years.  Intermittent leukopenia- typically get cbc with differential. Has seen hematology in past- thought to be his baseline Lab Results  Component Value Date   WBC 5.3 04/30/2024   HGB 15.0 04/30/2024   HCT 46.6 04/30/2024   MCV 80.5 04/30/2024   PLT 101 (L) 04/30/2024  A/P: workup has been reassuring through hematology continue to monitor   #Dupuytren- injections have helped- considering seeing DrRONITA Joane back  #history of prostatectomy for BPH- we will check PSA_ if continues to trend up may ask for their opinoin Lab Results  Component Value Date   PSA  1.89 11/16/2023   PSA 1.67 05/17/2023   PSA 1.34 04/11/2022   Recommended follow up: Return in about 4 months (around 02/13/2025) for physical or sooner if needed.Schedule b4 you leave. Future Appointments  Date Time Provider Department Center  11/07/2024 11:15 AM [facility] None  11/07/2024 11:45 AM Pasam, Chinita, MD [facility] None  04/23/2025  8:00 AM [facility] ANNUAL WELLNESS VISIT 1 [facility] [place]    Lab/Order associations:   ICD-10-CM   1. Type 2 diabetes mellitus with diabetic neuropathy, without long-term current use of insulin  (HCC)  E11.40 Comprehensive metabolic panel with GFR    CBC with Differential/Platelet    Hemoglobin A1c    2. Hyperlipidemia associated with type 2 diabetes mellitus (HCC)  E11.69 LDL cholesterol, direct   Z21.4     3. Essential hypertension  I10     4. Screening for prostate cancer  Z12.5 PSA, Medicare ( [place] only)      No orders of the defined types were placed in this encounter.   Return precautions advised.  Garnette Lukes, MD

## 2024-10-16 ENCOUNTER — Ambulatory Visit: Payer: Self-pay | Admitting: Family Medicine

## 2024-10-23 ENCOUNTER — Other Ambulatory Visit: Payer: Self-pay | Admitting: Family Medicine

## 2024-10-28 ENCOUNTER — Other Ambulatory Visit: Payer: Self-pay | Admitting: Family Medicine

## 2024-11-04 NOTE — Progress Notes (Signed)
 Preston Pennington                                          MRN: [id number]   11/04/2024   The [facility] Quality Team Specialist reviewed this patient medical record for the purposes of chart review for care gap closure. The following were reviewed: abstraction for care gap closure-glycemic status assessment.    [facility] Quality Team

## 2024-11-07 ENCOUNTER — Inpatient Hospital Stay: Admitting: Oncology

## 2024-11-07 ENCOUNTER — Inpatient Hospital Stay: Attending: Oncology

## 2024-11-07 VITALS — BP 131/79 | HR 63 | Temp 97.2°F | Resp 17 | Wt 157.2 lb

## 2024-11-07 DIAGNOSIS — R682 Dry mouth, unspecified: Secondary | ICD-10-CM | POA: Diagnosis not present

## 2024-11-07 DIAGNOSIS — D72819 Decreased white blood cell count, unspecified: Secondary | ICD-10-CM | POA: Diagnosis present

## 2024-11-07 DIAGNOSIS — Z7984 Long term (current) use of oral hypoglycemic drugs: Secondary | ICD-10-CM | POA: Insufficient documentation

## 2024-11-07 DIAGNOSIS — D696 Thrombocytopenia, unspecified: Secondary | ICD-10-CM | POA: Insufficient documentation

## 2024-11-07 DIAGNOSIS — N400 Enlarged prostate without lower urinary tract symptoms: Secondary | ICD-10-CM | POA: Insufficient documentation

## 2024-11-07 DIAGNOSIS — D709 Neutropenia, unspecified: Secondary | ICD-10-CM

## 2024-11-07 DIAGNOSIS — Z79899 Other long term (current) drug therapy: Secondary | ICD-10-CM | POA: Insufficient documentation

## 2024-11-07 LAB — CBC WITH DIFFERENTIAL (CANCER CENTER ONLY)
Abs Immature Granulocytes: 0.01 K/uL (ref 0.00–0.07)
Basophils Absolute: 0 K/uL (ref 0.0–0.1)
Basophils Relative: 1 %
Eosinophils Absolute: 0.2 K/uL (ref 0.0–0.5)
Eosinophils Relative: 5 %
HCT: 44.9 % (ref 39.0–52.0)
Hemoglobin: 14.6 g/dL (ref 13.0–17.0)
Immature Granulocytes: 0 %
Lymphocytes Relative: 42 %
Lymphs Abs: 1.8 K/uL (ref 0.7–4.0)
MCH: 26.1 pg (ref 26.0–34.0)
MCHC: 32.5 g/dL (ref 30.0–36.0)
MCV: 80.2 fL (ref 80.0–100.0)
Monocytes Absolute: 0.4 K/uL (ref 0.1–1.0)
Monocytes Relative: 10 %
Neutro Abs: 1.7 K/uL (ref 1.7–7.7)
Neutrophils Relative %: 42 %
Platelet Count: 95 K/uL — ABNORMAL LOW (ref 150–400)
RBC: 5.6 MIL/uL (ref 4.22–5.81)
RDW: 13.5 % (ref 11.5–15.5)
WBC Count: 4.1 K/uL (ref 4.0–10.5)
nRBC: 0 % (ref 0.0–0.2)

## 2024-11-07 NOTE — Assessment & Plan Note (Signed)
 Reports of dry mouth. No evidence of Sjogren's syndrome on previous testing. -Continue use of hard candy/lozenges to stimulate salivary glands. -Use non-alcoholic mouthwash, especially at night.

## 2024-11-07 NOTE — Assessment & Plan Note (Signed)
-   Fluctuating neutropenia/leukopenia.  However lately his white count has been within normal limits.  White count is currently normal at 5300 with ANC of 2700, normal differential.  -He has had extensive workup for thrombocytopenia and leukopenia as mentioned under hematology history. 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  Medications including allopurinol  and colchicine  could be contributing. No additional intervention is warranted at current levels.

## 2024-11-07 NOTE — Assessment & Plan Note (Signed)
 Long-standing, stable mild thrombocytopenia and leukopenia. No concerning findings on extensive workup including hematologic, rheumatological and infectious disease testing. No evidence of immature cells or blasts in the blood. No symptoms of bleeding or bruising.  -Labs today showed stable platelet count of 95,000.  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  However no hematological intervention would be warranted at current platelet levels.  If platelet count is below 30,000, then treatment with steroids could be considered.  However platelet count has remained stable for at least 20 years for him.  So I do not see the need for this currently.  -Continue monitoring with blood work every 6 months.

## 2024-11-07 NOTE — Progress Notes (Unsigned)
 "  [facility]  HEMATOLOGY CLINIC PROGRESS NOTE  PATIENT NAME: Preston Pennington   MR#: [id number] DOB: Aug 27, 1951  Patient Care Team: Katrinka Garnette MALVA, MD as PCP - General (Family Medicine) Waylan Cain, MD as Consulting Physician (Ophthalmology)  Date of visit: 11/07/2024   ASSESSMENT & PLAN:   Preston Pennington is a 74 y.o. gentleman, originally from [place], with a past medical history of  adenomatous colon polyps, hypertension, hyperlipidemia, diabetes mellitus type 2, glaucoma, enlarged prostate, neuromuscular disorder, Dupuytren's contracture, gout, presented to clinic for follow-up of chronic thrombocytopenia early since early 2000's and mild intermittent leukopenia.  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  Thrombocytopenia Long-standing, stable mild thrombocytopenia and leukopenia. No concerning findings on extensive workup including hematologic, rheumatological and infectious disease testing. No evidence of immature cells or blasts in the blood. No symptoms of bleeding or bruising.  -Labs today showed stable platelet count of 95,000.  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  However no hematological intervention would be warranted at current platelet levels.  If platelet count is below 30,000, then treatment with steroids could be considered.  However platelet count has remained stable for at least 20 years for him.  So I do not see the need for this currently.  -Continue monitoring with blood work every 6 months.  Hx of intermittent Neutropenia (HCC) - Fluctuating neutropenia/leukopenia.  However lately his white count has been within normal limits.  White count is currently normal at 4100 with ANC of 1700, normal differential.  -He has had extensive workup for thrombocytopenia and leukopenia as mentioned under hematology history. 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  Medications including allopurinol  and colchicine   could be contributing. No additional intervention is warranted at current levels.  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 No evidence of Sjogren's syndrome on previous testing. Symptoms are managed with frequent sips of water and avoidance of alcohol-containing mouthwash. -Continue use of hard candy/lozenges to stimulate salivary glands. -Use non-alcoholic mouthwash, especially at night.   I spent a total of 22 minutes during this encounter with the patient including review of chart and various tests results, discussions about plan of care and coordination of care plan.  I reviewed lab results and outside records for this visit and discussed relevant results with the patient. Diagnosis, plan of care and treatment options were also discussed in detail with the patient. Opportunity provided to ask questions and answers provided to his apparent satisfaction. Provided instructions to call our clinic with any problems, questions or concerns prior to return visit. I recommended to continue follow-up with PCP and sub-specialists. He verbalized understanding and agreed with the plan. No barriers to learning was detected.  Chinita Patten, MD  11/07/2024 12:03 PM  [facility] - A DEPT OF [facility] [address] [place] [address] Dept: [phone] Dept Fax: [phone]   CHIEF COMPLAINT/ REASON FOR VISIT:  Follow-up for history of chronic thrombocytopenia likely since early 2000's and mild intermittent leukopenia.  INTERVAL HISTORY:  Discussed the use of AI scribe software for clinical note transcription with the patient, who gave verbal consent to proceed.  History of Present Illness Preston Pennington is a 74 year old male with chronic thrombocytopenia who presents for routine hematology follow-up and evaluation of persistent xerostomia.  He  has  chronic, stable thrombocytopenia with platelet counts consistently ranging from 80,000 to 120,000 over several decades. His most recent platelet count was 90,000 last month and 95,000 today. He remains asymptomatic, with no epistaxis, ecchymosis, or other hemorrhagic manifestations. He has not experienced fevers, chills, or infections in the past six months.  He continues to experience persistent xerostomia, which he manages with frequent sips of water that provide symptomatic relief. He previously used alcohol-containing mouthwash but discontinued it due to worsening symptoms, and now uses a non-alcohol-based mouthwash. He attributes xerostomia in part to his chronic medications, including metformin , glipizide , Jardiance , and intermittent colchicine  for gout.  He remains active in daily activities and did not report any new health concerns.   SUMMARY OF HEMATOLOGIC HISTORY:  November 08, 2022: WBC 3.7 hemoglobin 14.5 platelet count 97; 39 segs 45 lymphs 10 monos 5 eos 1 basophil ANC 1.5 CMP notable for glucose of 170   December 08 2022:  [facility] Consult Patient has frequent gout attacks that typically involve his right foot.  States that he has been on colchicine  for about 8 years.  Claims that DM Type II is well controlled. He states that his PLT count has been low for at least 15 yrs.  No bleeding problems.   Social:  Originally from [place].  Works in a nursing home.  Married.  Tobacco none.  EtOH none.     WBC 3.6 hemoglobin 14.7 MCV 81 platelet count 93; 40 segs 45 lymphs 9 monos 5 eos 1 basophil.  ANC 1.4.  Smear for morphology review notable for large platelets. Coombs test negative. Haptoglobin 109 Fibrinogen  242 INR 1.0 SPEP with IP showed no paraprotein IgG 1120 IgA 169 IgM 67 ANA panel negative.  Rheumatoid factor negative Hepatitis panel negative.  HIV negative Folate 16.6. Copper  93, B12 694, zinc  88   December 27, 2022: Ultrasound of left breast including  axilla.  Oval area of subtle ill-defined increased echogenicity within the subcutaneous fat lobule at 9 o'clock position left breast 1 cm from nipple measuring 3.8 x 2.8 x 0.8.  Contains 4 small cystic areas.  Impression was evolution of an area of fat necrosis with developing oil cyst at 9 o'clock position no evidence of malignancy    January 19, 2023: Abdominal ultrasound spleen normal in size and appearance.  Liver mildly echogenic.  Compatible with hepatic steatosis   February 21, 2023: WBC 4.6 hemoglobin 15.4 platelet count 107 differential normal.  Glucose 214   Mar 30 2023:   Scheduled follow up for thrombocytopenia.  Reviewed results of labs and U/S with patient   No bleeding issues.  Platelet 109,000.  CMV, EBV, QuantiFERON testing, cold agglutinin titer were all negative.   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  Medications including allopurinol  and colchicine  could be contributing.  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  I have reviewed the past medical history, past surgical history, social history and family history with the patient and they are unchanged from previous note.  ALLERGIES: He has no known allergies.  MEDICATIONS:  Current Outpatient Medications  Medication Sig Dispense Refill   ACCU-CHEK GUIDE TEST test strip USE 1  STRIP 4 TIMES DAILY 100 each 0   Accu-Chek Softclix Lancets lancets 4 (four) times daily.     acetaminophen  (TYLENOL ) 500 MG tablet Take 1,000 mg by mouth every 6 (six) hours as needed for moderate pain.      allopurinol  (ZYLOPRIM ) 100 MG tablet Take 1 tablet by mouth  once daily 90 tablet 0   amLODipine  (NORVASC ) 5 MG tablet Take 1 tablet by mouth once daily 90 tablet 0   atorvastatin  (LIPITOR) 10 MG tablet Take 1 tablet (10 mg total) by mouth once a week. 13 tablet 3   azelastine  (ASTELIN ) 0.1 % nasal spray Place 2 sprays into both nostrils 2 (two) times daily. For runny nose. 30 mL 12   colchicine  0.6 MG tablet TAKE 2 TABLETS BY  MOUTH AT THE FIRST SIGN OF GOUT THEN TAKE 1 TABLET 2 HOURS LATER on day 1 (max 3 tablets), then take daily UNTIL FLARE IS RESOLVED  60 tablet 3   dorzolamide-timolol (COSOPT) 2-0.5 % ophthalmic solution Place 1 drop into both eyes 2 (two) times daily.     feeding supplement, ENSURE ENLIVE, (ENSURE ENLIVE) LIQD Take 1 Bottle by mouth daily.     gabapentin  (NEURONTIN ) 300 MG capsule TAKE 1 CAPSULE BY MOUTH IN THE MORNING AND 1 AT BEDTIME 60 capsule 5   glipiZIDE  (GLUCOTROL ) 5 MG tablet TAKE 1 TABLET BY MOUTH TWICE DAILY BEFORE MEAL(S) 180 tablet 0   JARDIANCE  10 MG TABS tablet TAKE 1 TABLET BY MOUTH ONCE DAILY BEFORE BREAKFAST 90 tablet 0   latanoprost (XALATAN) 0.005 % ophthalmic solution Place 1 drop into both eyes at bedtime.      lisinopril  (ZESTRIL ) 20 MG tablet Take 1 tablet by mouth once daily 90 tablet 0   metFORMIN  (GLUCOPHAGE ) 1000 MG tablet TAKE 1 TABLET BY MOUTH TWICE DAILY WITH MEALS . APPOINTMENT REQUIRED FOR FUTURE REFILLS 60 tablet 0   Multiple Vitamin (MULTIVITAMIN ADULT PO) Take by mouth.     Polyethyl Glycol-Propyl Glycol (SYSTANE) 0.4-0.3 % SOLN Apply to eye. For dry eyes 3 x a day     Tetrahyd-Glyc-Hypro-PEG-ZnSulf (VISINE TOTALITY MULTI-SYMPTOM OP) Apply 1 drop to eye in the morning, at noon, and at bedtime.     No current facility-administered medications for this visit.     REVIEW OF SYSTEMS:    Review of Systems - Oncology  All other pertinent systems were reviewed with the patient and are negative.  PHYSICAL EXAMINATION:   Onc Performance Status - 11/07/24 1159       ECOG Perf Status   ECOG Perf Status Fully active, able to carry on all pre-disease performance without restriction      KPS SCALE   KPS % SCORE Able to carry on normal activity, minor s/s of disease           Vitals:   11/07/24 1158  BP: 131/79  Pulse: 63  Resp: 17  Temp: (!) 97.2 F (36.2 C)  SpO2: 100%    Filed Weights   11/07/24 1158  Weight: 157 lb 3.2 oz (71.3 kg)      Physical Exam Constitutional:      General: He is not in acute distress.    Appearance: Normal appearance.  HENT:     Head: Normocephalic and atraumatic.  Eyes:     Conjunctiva/sclera: Conjunctivae normal.  Cardiovascular:     Rate and Rhythm: Normal rate and regular rhythm.  Pulmonary:     Effort: Pulmonary effort is normal. No respiratory distress.  Abdominal:     General: There is no distension.  Neurological:     General: No focal deficit present.     Mental Status: He is alert and oriented to person, place, and time.  Psychiatric:        Mood and Affect: Mood normal.  Behavior: Behavior normal.     LABORATORY DATA:   I have reviewed the data as listed.  Results for orders placed or performed in visit on 11/07/24  CBC with Differential (Cancer Center Only)  Result Value Ref Range   WBC Count 4.1 4.0 - 10.5 K/uL   RBC 5.60 4.22 - 5.81 MIL/uL   Hemoglobin 14.6 13.0 - 17.0 g/dL   HCT 55.0 60.9 - 47.9 %   MCV 80.2 80.0 - 100.0 fL   MCH 26.1 26.0 - 34.0 pg   MCHC 32.5 30.0 - 36.0 g/dL   RDW 86.4 88.4 - 84.4 %   Platelet Count 95 (L) 150 - 400 K/uL   nRBC 0.0 0.0 - 0.2 %   Neutrophils Relative % 42 %   Neutro Abs 1.7 1.7 - 7.7 K/uL   Lymphocytes Relative 42 %   Lymphs Abs 1.8 0.7 - 4.0 K/uL   Monocytes Relative 10 %   Monocytes Absolute 0.4 0.1 - 1.0 K/uL   Eosinophils Relative 5 %   Eosinophils Absolute 0.2 0.0 - 0.5 K/uL   Basophils Relative 1 %   Basophils Absolute 0.0 0.0 - 0.1 K/uL   Immature Granulocytes 0 %   Abs Immature Granulocytes 0.01 0.00 - 0.07 K/uL     RADIOGRAPHIC STUDIES:  No recent pertinent imaging studies available to review.  Orders Placed This Encounter  Procedures   CBC with Differential (Cancer Center Only)    Standing Status:   Future    Expiration Date:   11/07/2025     Future Appointments  Date Time Provider Department Center  02/25/2025 10:00 AM Katrinka Garnette KIDD, MD [facility]  04/23/2025  8:00 AM  [facility] ANNUAL WELLNESS VISIT 1 [facility]  05/07/2025 11:30 AM [facility] LAB [facility] None  05/07/2025 11:45 AM Dennis Killilea, Chinita, MD [facility] None     This document was completed utilizing speech recognition software. Grammatical errors, random word insertions, pronoun errors, and incomplete sentences are an occasional consequence of this system due to software limitations, ambient noise, and hardware issues. Any formal questions or concerns about the content, text or information contained within the body of this dictation should be directly addressed to the provider for clarification.   "

## 2024-11-10 ENCOUNTER — Encounter: Payer: Self-pay | Admitting: Oncology

## 2024-11-11 ENCOUNTER — Other Ambulatory Visit: Payer: Self-pay | Admitting: Family Medicine

## 2024-11-24 ENCOUNTER — Other Ambulatory Visit: Payer: Self-pay | Admitting: Family Medicine

## 2024-11-28 ENCOUNTER — Other Ambulatory Visit: Payer: Self-pay

## 2024-11-28 MED ORDER — COLCHICINE 0.6 MG PO TABS: ORAL_TABLET | ORAL | 3 refills | Status: AC

## 2025-02-25 ENCOUNTER — Encounter: Admitting: Family Medicine

## 2025-04-23 ENCOUNTER — Ambulatory Visit

## 2025-05-07 ENCOUNTER — Inpatient Hospital Stay: Admitting: Oncology

## 2025-05-07 ENCOUNTER — Inpatient Hospital Stay
